# Patient Record
Sex: Female | Born: 1985 | Race: Black or African American | Hispanic: No | Marital: Single | Smoking: Current every day smoker
Health system: Southern US, Community
[De-identification: ages and names within clinical notes are randomized; demographics above are authoritative.]

## PROBLEM LIST (undated history)

## (undated) ENCOUNTER — Inpatient Hospital Stay (HOSPITAL_COMMUNITY): Payer: Self-pay

## (undated) DIAGNOSIS — F32A Depression, unspecified: Secondary | ICD-10-CM

## (undated) DIAGNOSIS — M549 Dorsalgia, unspecified: Secondary | ICD-10-CM

## (undated) DIAGNOSIS — Z6841 Body Mass Index (BMI) 40.0 and over, adult: Secondary | ICD-10-CM

## (undated) DIAGNOSIS — Z789 Other specified health status: Secondary | ICD-10-CM

## (undated) DIAGNOSIS — T7840XA Allergy, unspecified, initial encounter: Secondary | ICD-10-CM

## (undated) HISTORY — DX: Allergy, unspecified, initial encounter: T78.40XA

## (undated) HISTORY — DX: Body Mass Index (BMI) 40.0 and over, adult: Z684

## (undated) HISTORY — DX: Morbid (severe) obesity due to excess calories: E66.01

## (undated) HISTORY — DX: Dorsalgia, unspecified: M54.9

## (undated) HISTORY — PX: WISDOM TOOTH EXTRACTION: SHX21

---

## 2007-01-14 ENCOUNTER — Emergency Department (HOSPITAL_COMMUNITY): Admission: EM | Admit: 2007-01-14 | Discharge: 2007-01-15 | Payer: Self-pay | Admitting: Emergency Medicine

## 2007-09-28 ENCOUNTER — Emergency Department (HOSPITAL_COMMUNITY): Admission: EM | Admit: 2007-09-28 | Discharge: 2007-09-28 | Payer: Self-pay | Admitting: Emergency Medicine

## 2007-12-30 ENCOUNTER — Emergency Department (HOSPITAL_COMMUNITY): Admission: EM | Admit: 2007-12-30 | Discharge: 2007-12-31 | Payer: Self-pay | Admitting: Emergency Medicine

## 2009-01-04 ENCOUNTER — Emergency Department (HOSPITAL_COMMUNITY): Admission: EM | Admit: 2009-01-04 | Discharge: 2009-01-04 | Payer: Self-pay | Admitting: Emergency Medicine

## 2009-07-28 ENCOUNTER — Encounter (INDEPENDENT_AMBULATORY_CARE_PROVIDER_SITE_OTHER): Payer: Self-pay | Admitting: Obstetrics & Gynecology

## 2009-07-28 ENCOUNTER — Ambulatory Visit: Payer: Self-pay | Admitting: Obstetrics and Gynecology

## 2009-07-28 ENCOUNTER — Inpatient Hospital Stay (HOSPITAL_COMMUNITY): Admission: AD | Admit: 2009-07-28 | Discharge: 2009-07-31 | Payer: Self-pay | Admitting: Obstetrics & Gynecology

## 2010-03-15 ENCOUNTER — Emergency Department (HOSPITAL_COMMUNITY): Admission: EM | Admit: 2010-03-15 | Discharge: 2010-03-15 | Payer: Self-pay | Admitting: Emergency Medicine

## 2010-11-04 ENCOUNTER — Encounter: Payer: Self-pay | Admitting: Obstetrics & Gynecology

## 2011-01-16 LAB — DIFFERENTIAL
Basophils Absolute: 0.1 10*3/uL (ref 0.0–0.1)
Eosinophils Absolute: 0.1 10*3/uL (ref 0.0–0.7)
Eosinophils Relative: 1 % (ref 0–5)
Lymphocytes Relative: 23 % (ref 12–46)
Monocytes Absolute: 0.6 10*3/uL (ref 0.1–1.0)

## 2011-01-16 LAB — CBC
HCT: 28.4 % — ABNORMAL LOW (ref 36.0–46.0)
HCT: 30.4 % — ABNORMAL LOW (ref 36.0–46.0)
Hemoglobin: 10.4 g/dL — ABNORMAL LOW (ref 12.0–15.0)
Hemoglobin: 9.7 g/dL — ABNORMAL LOW (ref 12.0–15.0)
MCHC: 34.1 g/dL (ref 30.0–36.0)
MCV: 91.5 fL (ref 78.0–100.0)
Platelets: 159 10*3/uL (ref 150–400)
Platelets: 188 10*3/uL (ref 150–400)
RBC: 3.35 MIL/uL — ABNORMAL LOW (ref 3.87–5.11)
RBC: 3.79 MIL/uL — ABNORMAL LOW (ref 3.87–5.11)
RDW: 13.4 % (ref 11.5–15.5)
WBC: 12.6 10*3/uL — ABNORMAL HIGH (ref 4.0–10.5)
WBC: 9.7 10*3/uL (ref 4.0–10.5)

## 2011-01-29 ENCOUNTER — Emergency Department (HOSPITAL_COMMUNITY)
Admission: EM | Admit: 2011-01-29 | Discharge: 2011-01-29 | Disposition: A | Discharge status: Home / Self Care | Payer: No Typology Code available for payment source | Attending: Emergency Medicine | Admitting: Emergency Medicine

## 2011-01-29 DIAGNOSIS — M545 Low back pain, unspecified: Secondary | ICD-10-CM | POA: Insufficient documentation

## 2011-01-29 DIAGNOSIS — E669 Obesity, unspecified: Secondary | ICD-10-CM | POA: Insufficient documentation

## 2011-01-29 DIAGNOSIS — S335XXA Sprain of ligaments of lumbar spine, initial encounter: Secondary | ICD-10-CM | POA: Insufficient documentation

## 2012-05-13 ENCOUNTER — Encounter (HOSPITAL_COMMUNITY): Payer: Self-pay

## 2012-05-13 ENCOUNTER — Emergency Department (INDEPENDENT_AMBULATORY_CARE_PROVIDER_SITE_OTHER)
Admission: EM | Admit: 2012-05-13 | Discharge: 2012-05-13 | Disposition: A | Discharge status: Home / Self Care | Payer: Self-pay | Source: Home / Self Care | Attending: Emergency Medicine | Admitting: Emergency Medicine

## 2012-05-13 DIAGNOSIS — S39012A Strain of muscle, fascia and tendon of lower back, initial encounter: Secondary | ICD-10-CM

## 2012-05-13 DIAGNOSIS — S335XXA Sprain of ligaments of lumbar spine, initial encounter: Secondary | ICD-10-CM

## 2012-05-13 MED ORDER — TRAMADOL HCL 50 MG PO TABS: 50.0000 mg | ORAL_TABLET | Freq: Four times a day (QID) | ORAL | Status: AC | PRN

## 2012-05-13 MED ORDER — CYCLOBENZAPRINE HCL 10 MG PO TABS: 10.0000 mg | ORAL_TABLET | Freq: Three times a day (TID) | ORAL | Status: AC | PRN

## 2012-05-13 NOTE — ED Notes (Signed)
Pt states she bent over and felt tightness in her lower back around July 12th.  States in the last week the pain is much worse- states mostly rt lower back but can feel it to entire lower back with certain movements.  Pain is worse with sitting.

## 2012-05-13 NOTE — ED Provider Notes (Signed)
History     CSN: [id number]  Arrival date & time 05/13/12  1825   First MD Initiated Contact with Patient 05/13/12 1826      Chief Complaint  Patient presents with  . Back Pain    (Consider location/radiation/quality/duration/timing/severity/associated sxs/prior treatment) HPI Comments: Patient presents to urgent care approximately 2 weeks after she felt a sudden onset of tightness in her right lower back after she bent over to pick up something. Since last week she hasn't felt that the pain has worsened and has localized in the right side of her back. Patient describes it bending over and leaning forward and sitting makes the pain worse. Feels as sharp in character and denies any other associated symptoms such as numbness, tingling, weakness sensations on her right lower extremity. Patient also denies any constitutional symptoms such as fevers, malaise, arthralgias myalgias or unintentional weight loss.  Patient is a 26 y.o. female presenting with back pain. The history is provided by the patient.  Back Pain  This is a new problem. The current episode started more than 1 week ago. The problem occurs constantly. The problem has not changed since onset.Associated with: After bending over. The pain is present in the lumbar spine. The pain is at a severity of 7/10. The pain is moderate. The symptoms are aggravated by bending, twisting and certain positions. Stiffness is present all day. Pertinent negatives include no fever. She has tried nothing for the symptoms. The treatment provided no relief. Risk factors include obesity.    History reviewed. No pertinent past medical history.  Past Surgical History  Procedure Date  . Cesarean section     No family history on file.  History  Substance Use Topics  . Smoking status: Never Smoker   . Smokeless tobacco: Not on file  . Alcohol Use: Yes    OB History    Grav Para Term Preterm Abortions TAB SAB Ect Mult Living                   Review of Systems  Constitutional: Positive for activity change. Negative for fever and chills.  Musculoskeletal: Positive for back pain. Negative for myalgias, joint swelling and gait problem.  Skin: Negative for wound.    Allergies  Mucinex  Home Medications   Current Outpatient Rx  Name Route Sig Dispense Refill  . CYCLOBENZAPRINE HCL 10 MG PO TABS Oral Take 1 tablet (10 mg total) by mouth 3 (three) times daily as needed for muscle spasms. 20 tablet 0  . TRAMADOL HCL 50 MG PO TABS Oral Take 1 tablet (50 mg total) by mouth every 6 (six) hours as needed for pain. 15 tablet 0    BP 119/76  Pulse 97  Temp 98.7 F (37.1 C) (Oral)  Resp 18  SpO2 95%  Physical Exam  Vitals reviewed. Constitutional: Vital signs are normal. She appears well-developed.  Non-toxic appearance. She does not have a sickly appearance. She does not appear ill. No distress.  Abdominal: Soft.  Musculoskeletal: She exhibits tenderness. She exhibits no edema.       Lumbar back: She exhibits decreased range of motion, tenderness and pain. She exhibits no bony tenderness, no swelling, no edema, no deformity, no laceration, no spasm and normal pulse.       Back:  Neurological: She is alert.  Skin: Skin is warm. No rash noted. No erythema.    ED Course  Procedures (including critical care time)  Labs Reviewed - No data to display No  results found.   1. Strain of lumbar paraspinal muscle       MDM  Patient's exam is consistent with a right paravertebral or paraspinal muscular sprain or strain No Neuromuscular deficits on exam. Patient was prescribed tramadol and Flexeril and encouraged to perform back exercises. Was also advised to followup in orthopedic Dr. if pain persists beyond 4-6 weeks. We also discussed what symptoms will warrant further evaluation.        Jimmie Molly, MD 05/13/12 1902

## 2012-10-13 HISTORY — PX: TOOTH EXTRACTION: SUR596

## 2012-11-11 ENCOUNTER — Encounter (HOSPITAL_COMMUNITY): Payer: Self-pay | Admitting: Emergency Medicine

## 2012-11-11 ENCOUNTER — Emergency Department (HOSPITAL_COMMUNITY)
Admission: EM | Admit: 2012-11-11 | Discharge: 2012-11-11 | Disposition: A | Discharge status: Home / Self Care | Payer: Medicaid Other | Attending: Emergency Medicine | Admitting: Emergency Medicine

## 2012-11-11 DIAGNOSIS — F17200 Nicotine dependence, unspecified, uncomplicated: Secondary | ICD-10-CM | POA: Insufficient documentation

## 2012-11-11 DIAGNOSIS — L309 Dermatitis, unspecified: Secondary | ICD-10-CM

## 2012-11-11 DIAGNOSIS — L259 Unspecified contact dermatitis, unspecified cause: Secondary | ICD-10-CM | POA: Insufficient documentation

## 2012-11-11 NOTE — ED Provider Notes (Signed)
History    Scribed for the midlevel working with Gavin Pound. Ghim, MD, the patient was seen in room TR02C/TR02C . This chart was scribed by Lewanda Rife.  CSN: [id number]  Arrival date & time 11/11/12  4098   First MD Initiated Contact with Patient 11/11/12 2035      Chief Complaint  Patient presents with  . Rash    (Consider location/radiation/quality/duration/timing/severity/associated sxs/prior treatment) HPI Betty Shelton is a 27 y.o. female who presents to the Emergency Department complaining of an intermittently moderately pruritic rash on right forearm onset today. Pt reports uncle who was released from prison 10 days ago was recently diagnosed with scabies and pt is concerned she might have it. Pt states uncle is sleeping in the house and she helped put some of the scabies medication on his back with gloves on. Pt denies having rash anywhere else, new detergents, new products, and fever. Pt denies try any OTC medications to treat symptoms.    History reviewed. No pertinent past medical history.  Past Surgical History  Procedure Date  . Cesarean section     History reviewed. No pertinent family history.  History  Substance Use Topics  . Smoking status: Current Every Day Smoker  . Smokeless tobacco: Not on file  . Alcohol Use: Yes     Comment: socially    OB History    Grav Para Term Preterm Abortions TAB SAB Ect Mult Living                  Review of Systems  Constitutional: Negative.   HENT: Negative.   Respiratory: Negative.   Cardiovascular: Negative.   Gastrointestinal: Negative.   Musculoskeletal: Negative.   Skin: Positive for rash.  Neurological: Negative.   Hematological: Negative.   Psychiatric/Behavioral: Negative.   All other systems reviewed and are negative.   A complete 10 system review of systems was obtained and all systems are negative except as noted in the HPI and PMH.   Allergies  Mucinex  Home Medications  No current  outpatient prescriptions on file.  BP 139/53  Pulse 93  Temp 97.9 F (36.6 C) (Oral)  Resp 16  SpO2 96%  Physical Exam  Nursing note and vitals reviewed. Constitutional: She is oriented to person, place, and time. She appears well-developed and well-nourished. No distress.  HENT:  Head: Normocephalic and atraumatic.  Eyes: Conjunctivae normal are normal.  Neck: Neck supple. No tracheal deviation present.  Cardiovascular: Normal rate.   Pulmonary/Chest: Effort normal. No respiratory distress.  Musculoskeletal: Normal range of motion.  Neurological: She is alert and oriented to person, place, and time.  Skin: Skin is warm and dry. Rash (3 mildly raised papules noted on posterior right forearm) noted.  Psychiatric: She has a normal mood and affect. Her behavior is normal.    ED Course  Procedures (including critical care time)  Labs Reviewed - No data to display No results found.   No diagnosis found.  Dermatitis, limited to 3-4 papules on right forearm, no erythema.  Does not appear to be scabies.  MDM     I personally performed the services described in this documentation, which was scribed in my presence. The recorded information has been reviewed and is accurate.     Jimmye Norman, NP 11/12/12 [phone]

## 2012-11-11 NOTE — ED Notes (Signed)
Patient states family member has been diagnosed with scabies.  Patient has small area on right lateral forearm with bumps that she noticed today.  Patient states that the area does itch.

## 2012-11-11 NOTE — ED Notes (Signed)
Pt verbalized understanding of discharge; no additional questions by pt about discharge; e-signature obtained;

## 2012-11-14 NOTE — ED Provider Notes (Signed)
Medical screening examination/treatment/procedure(s) were performed by non-physician practitioner and as supervising physician I was immediately available for consultation/collaboration.   Tennyson Kallen Y. Cherise Fedder, MD 11/14/12 1630 

## 2013-08-30 ENCOUNTER — Encounter (HOSPITAL_COMMUNITY): Payer: Self-pay | Admitting: Emergency Medicine

## 2013-08-30 ENCOUNTER — Emergency Department (HOSPITAL_COMMUNITY)
Admission: EM | Admit: 2013-08-30 | Discharge: 2013-08-30 | Disposition: A | Discharge status: Home / Self Care | Payer: Medicaid Other | Attending: Emergency Medicine | Admitting: Emergency Medicine

## 2013-08-30 DIAGNOSIS — L02219 Cutaneous abscess of trunk, unspecified: Secondary | ICD-10-CM | POA: Insufficient documentation

## 2013-08-30 DIAGNOSIS — L02214 Cutaneous abscess of groin: Secondary | ICD-10-CM

## 2013-08-30 DIAGNOSIS — Z975 Presence of (intrauterine) contraceptive device: Secondary | ICD-10-CM | POA: Insufficient documentation

## 2013-08-30 DIAGNOSIS — F17200 Nicotine dependence, unspecified, uncomplicated: Secondary | ICD-10-CM | POA: Insufficient documentation

## 2013-08-30 DIAGNOSIS — Z792 Long term (current) use of antibiotics: Secondary | ICD-10-CM | POA: Insufficient documentation

## 2013-08-30 MED ORDER — DOXYCYCLINE HYCLATE 100 MG PO TABS
100.0000 mg | ORAL_TABLET | Freq: Once | ORAL | Status: AC
  Administered 2013-08-30: 100 mg via ORAL
  Filled 2013-08-30: qty 1

## 2013-08-30 MED ORDER — IBUPROFEN 200 MG PO TABS
600.0000 mg | ORAL_TABLET | Freq: Once | ORAL | Status: AC
  Administered 2013-08-30: 600 mg via ORAL
  Filled 2013-08-30: qty 3

## 2013-08-30 MED ORDER — IBUPROFEN 600 MG PO TABS: 600.0000 mg | ORAL_TABLET | Freq: Four times a day (QID) | ORAL | Status: DC | PRN

## 2013-08-30 MED ORDER — DOXYCYCLINE HYCLATE 100 MG PO CAPS: 100.0000 mg | ORAL_CAPSULE | Freq: Two times a day (BID) | ORAL | Status: DC

## 2013-08-30 NOTE — ED Notes (Signed)
Pt reports hx of ingrown hairs to her pubic area, now has a cyst to her R inner leg/hip joint.  Reports the cyst has doubled in size since yesterday.

## 2013-08-30 NOTE — ED Provider Notes (Signed)
CSN: [id number]     Arrival date & time 08/30/13  2237 History  This chart was scribed for non-physician practitioner working with Gwyneth Sprout, MD by Ashley Jacobs, ED scribe. This patient was seen in room WTR8/WTR8 and the patient's care was started at 11:04 PM.   First MD Initiated Contact with Patient 08/30/13 2250     Chief Complaint  Patient presents with  . Cyst   (Consider location/radiation/quality/duration/timing/severity/associated sxs/prior Treatment) The history is provided by the patient and medical records. No language interpreter was used.   HPI Comments: Betty Shelton is a 27 y.o. female who presents to the Emergency Department complaining of a cyst to her right inner leg and hip joint. Pt explains the cyst initially began as a ingrown hair and states the cyst has doubled in size since yesterday. Pt has allergies to Mucinex and does not have any prior medical complications. Pt currently smokes tobacco everyday and drinks socially.Has a hx of " ingrown hairs" in her pubic area     History reviewed. No pertinent past medical history. Past Surgical History  Procedure Laterality Date  . Cesarean section     History reviewed. No pertinent family history. History  Substance Use Topics  . Smoking status: Current Every Day Smoker  . Smokeless tobacco: Not on file  . Alcohol Use: Yes     Comment: socially   OB History   Grav Para Term Preterm Abortions TAB SAB Ect Mult Living                 Review of Systems  Constitutional: Negative for fever and chills.  Skin: Positive for wound.  All other systems reviewed and are negative.    Allergies  Mucinex  Home Medications   Current Outpatient Rx  Name  Route  Sig  Dispense  Refill  . levonorgestrel (MIRENA) 20 MCG/24HR IUD   Intrauterine   1 each by Intrauterine route once.         . doxycycline (VIBRAMYCIN) 100 MG capsule   Oral   Take 1 capsule (100 mg total) by mouth 2 (two) times daily.   20  capsule   0   . ibuprofen (ADVIL,MOTRIN) 600 MG tablet   Oral   Take 1 tablet (600 mg total) by mouth every 6 (six) hours as needed.   30 tablet   0    BP 105/89  Pulse 84  Temp(Src) 98.4 F (36.9 C) (Oral)  Resp 20  Ht 5\' 5"  (1.651 m)  Wt 304 lb (137.893 kg)  BMI 50.59 kg/m2  SpO2 99% Physical Exam  Nursing note and vitals reviewed. Constitutional: She is oriented to person, place, and time. She appears well-developed and well-nourished. No distress.  Morbidly obese  HENT:  Head: Normocephalic and atraumatic.  Eyes: EOM are normal. Pupils are equal, round, and reactive to light.  Neck: Neck supple.  Cardiovascular: Normal rate.   Pulmonary/Chest: Effort normal. No respiratory distress.  Abdominal: Soft. She exhibits no distension.  Musculoskeletal: Normal range of motion.  Neurological: She is alert and oriented to person, place, and time.  Skin:   2 cm by 1 cm area of fluctuance. In the skin fold of the R groin    Psychiatric: She has a normal mood and affect. Her behavior is normal.    ED Course  INCISION AND DRAINAGE Date/Time: 08/30/2013 11:22 PM Performed by: Arman Filter Authorized by: Arman Filter Consent: Verbal consent obtained. written consent not obtained. Risks and benefits:  risks, benefits and alternatives were discussed Consent given by: patient Patient understanding: patient states understanding of the procedure being performed Patient identity confirmed: verbally with patient Type: abscess Body area: lower extremity Location details: right hip Anesthesia: local infiltration Local anesthetic: lidocaine 1% with epinephrine Anesthetic total: 1 ml Patient sedated: no Scalpel size: 11 Needle gauge: 22 Incision type: single straight Complexity: simple Drainage: purulent Drainage amount: moderate Wound treatment: wound left open Packing material: 1/4 in iodoform gauze Patient tolerance: Patient tolerated the procedure well with no immediate  complications.   (including critical care time) DIAGNOSTIC STUDIES: Oxygen Saturation is 99% on room air, normal by my interpretation.    COORDINATION OF CARE: 11:08 PM Discussed course of care with pt . Pt understands and agrees.   Labs Review Labs Reviewed - No data to display Imaging Review No results found.  EKG Interpretation   None       MDM   1. Abscess of groin, right     I personally performed the services described in this documentation, which was scribed in my presence. The recorded information has been reviewed and is accurate.     Arman Filter, NP 08/30/13 2325

## 2013-08-30 NOTE — ED Notes (Signed)
Dry gauze applied to abscess.

## 2013-08-30 NOTE — ED Notes (Signed)
Pt presents with an abscess in her right groin.

## 2013-08-31 NOTE — ED Provider Notes (Signed)
Medical screening examination/treatment/procedure(s) were performed by non-physician practitioner and as supervising physician I was immediately available for consultation/collaboration.   Bettyann Birchler, MD 08/31/13 0355 

## 2013-10-27 ENCOUNTER — Encounter: Payer: Self-pay | Admitting: Obstetrics & Gynecology

## 2013-10-27 ENCOUNTER — Ambulatory Visit (INDEPENDENT_AMBULATORY_CARE_PROVIDER_SITE_OTHER): Payer: Medicaid Other | Admitting: Obstetrics & Gynecology

## 2013-10-27 VITALS — BP 138/94 | HR 70 | Ht 65.0 in | Wt 311.0 lb

## 2013-10-27 DIAGNOSIS — Z3009 Encounter for other general counseling and advice on contraception: Secondary | ICD-10-CM

## 2013-10-27 DIAGNOSIS — IMO0001 Reserved for inherently not codable concepts without codable children: Secondary | ICD-10-CM

## 2013-10-27 DIAGNOSIS — R6882 Decreased libido: Secondary | ICD-10-CM

## 2013-10-27 MED ORDER — NORETHINDRON-ETHINYL ESTRAD-FE 1-20/1-30/1-35 MG-MCG PO TABS: 1.0000 | ORAL_TABLET | Freq: Every day | ORAL | Status: DC

## 2013-10-27 NOTE — Progress Notes (Signed)
   Subjective:    Patient ID: Betty Shelton, female    DOB: 1985-10-31, 28 y.o.   MRN: [id number]  HPI  28 yo S AA [id number] who is here today because she would like to have her Mirena removed and switch to another form of contraceptive. She liked Estrostep which she used in the past. She thinks that the Mirena has led to emotional and sexual changes (decreased libido). She also liked the acne treating effect of OCPs.  Review of Systems Her pap was normal in 5/15 She has already had a flu vaccine. She works at [employer] ([employer])    Objective:   Physical Exam        Assessment & Plan:  Contraception- I will prescribe estrostep today and she will RTC in 3-4 weeks for IUD removal.

## 2013-10-28 LAB — COMPREHENSIVE METABOLIC PANEL
ALK PHOS: 51 U/L (ref 39–117)
ALT: 8 U/L (ref 0–35)
AST: 14 U/L (ref 0–37)
Albumin: 4.2 g/dL (ref 3.5–5.2)
BUN: 9 mg/dL (ref 6–23)
CALCIUM: 9 mg/dL (ref 8.4–10.5)
CHLORIDE: 104 meq/L (ref 96–112)
CO2: 25 mEq/L (ref 19–32)
CREATININE: 0.68 mg/dL (ref 0.50–1.10)
Glucose, Bld: 87 mg/dL (ref 70–99)
POTASSIUM: 4.4 meq/L (ref 3.5–5.3)
Sodium: 140 mEq/L (ref 135–145)
Total Bilirubin: 0.5 mg/dL (ref 0.3–1.2)
Total Protein: 7.1 g/dL (ref 6.0–8.3)

## 2013-10-28 LAB — CBC
HEMATOCRIT: 40.1 % (ref 36.0–46.0)
Hemoglobin: 13 g/dL (ref 12.0–15.0)
MCH: 29.1 pg (ref 26.0–34.0)
MCHC: 32.4 g/dL (ref 30.0–36.0)
MCV: 89.9 fL (ref 78.0–100.0)
PLATELETS: 342 10*3/uL (ref 150–400)
RBC: 4.46 MIL/uL (ref 3.87–5.11)
RDW: 13 % (ref 11.5–15.5)
WBC: 7.1 10*3/uL (ref 4.0–10.5)

## 2013-10-28 LAB — LIPID PANEL
CHOL/HDL RATIO: 5.3 ratio
Cholesterol: 202 mg/dL — ABNORMAL HIGH (ref 0–200)
HDL: 38 mg/dL — AB (ref >39)
LDL CALC: 147 mg/dL — AB (ref 0–99)
TRIGLYCERIDES: 83 mg/dL (ref <150)
VLDL: 17 mg/dL (ref 0–40)

## 2013-10-28 LAB — TSH: TSH: 1.421 u[IU]/mL (ref 0.350–4.500)

## 2013-11-15 ENCOUNTER — Encounter: Payer: Self-pay | Admitting: Obstetrics & Gynecology

## 2013-11-15 ENCOUNTER — Ambulatory Visit (INDEPENDENT_AMBULATORY_CARE_PROVIDER_SITE_OTHER): Payer: BC Managed Care – PPO | Admitting: Obstetrics & Gynecology

## 2013-11-15 VITALS — BP 123/86 | HR 75 | Ht 65.0 in | Wt 311.4 lb

## 2013-11-15 DIAGNOSIS — N898 Other specified noninflammatory disorders of vagina: Secondary | ICD-10-CM

## 2013-11-15 DIAGNOSIS — Z30432 Encounter for removal of intrauterine contraceptive device: Secondary | ICD-10-CM

## 2013-11-15 DIAGNOSIS — IMO0001 Reserved for inherently not codable concepts without codable children: Secondary | ICD-10-CM

## 2013-11-15 NOTE — Progress Notes (Signed)
   Subjective:    Patient ID: Betty Shelton, female    DOB: 28-Apr-1986, 28 y.o.   MRN: [id number]  HPI  She is here for Mirena removal. It had been placed by Dr. Aldona BarWein. She is happy taking Estrostep.  Review of Systems     Objective:   Physical Exam  There was a large amount of frothy white discharge, so I sent it for a wet prep. The strings were not visible. I cleaned her cervix with betadine and tried uterine dressing forceps, a cytobrush, and a endometrial hook, all failed attempts to find the IUD.  Bedside u/s shows that the Mirena is still in the uterus.      Assessment & Plan:  Retained IUD- I will schedule her for outpatient IUD removal in the OR.

## 2013-11-16 LAB — WET PREP, GENITAL
TRICH WET PREP: NONE SEEN
WBC, Wet Prep HPF POC: NONE SEEN
YEAST WET PREP: NONE SEEN

## 2013-11-17 ENCOUNTER — Encounter (HOSPITAL_COMMUNITY): Payer: Self-pay

## 2013-11-21 ENCOUNTER — Encounter (HOSPITAL_COMMUNITY): Payer: Self-pay | Admitting: *Deleted

## 2013-11-21 ENCOUNTER — Telehealth: Payer: Self-pay | Admitting: *Deleted

## 2013-11-21 DIAGNOSIS — N760 Acute vaginitis: Principal | ICD-10-CM

## 2013-11-21 DIAGNOSIS — B9689 Other specified bacterial agents as the cause of diseases classified elsewhere: Secondary | ICD-10-CM

## 2013-11-21 MED ORDER — METRONIDAZOLE 500 MG PO TABS: 500.0000 mg | ORAL_TABLET | Freq: Two times a day (BID) | ORAL | Status: AC

## 2013-11-21 NOTE — Telephone Encounter (Signed)
Patient given results and medication has been called into her pharmacy.

## 2013-11-21 NOTE — Telephone Encounter (Signed)
Message copied by Barbara CowerNOGUES, Saylah Ketner L on Mon Nov 21, 2013 10:44 AM ------      Message from: Nicholaus BloomVE, MYRA C      Created: Thu Nov 17, 2013  2:57 PM       She needs a prescription for Flagyl 500 mg po BID for 7 days.      Thanks ------

## 2013-11-28 ENCOUNTER — Ambulatory Visit (HOSPITAL_COMMUNITY)
Admission: RE | Admit: 2013-11-28 | Discharge: 2013-11-28 | Disposition: A | Discharge status: Home / Self Care | Payer: BC Managed Care – PPO | Source: Ambulatory Visit | Attending: Obstetrics & Gynecology | Admitting: Obstetrics & Gynecology

## 2013-11-28 ENCOUNTER — Encounter (HOSPITAL_COMMUNITY): Payer: BC Managed Care – PPO | Admitting: Certified Registered"

## 2013-11-28 ENCOUNTER — Ambulatory Visit (HOSPITAL_COMMUNITY): Payer: BC Managed Care – PPO | Admitting: Certified Registered"

## 2013-11-28 ENCOUNTER — Encounter (HOSPITAL_COMMUNITY)
Admission: RE | Disposition: A | Discharge status: Home / Self Care | Payer: Self-pay | Source: Ambulatory Visit | Attending: Obstetrics & Gynecology

## 2013-11-28 ENCOUNTER — Encounter (HOSPITAL_COMMUNITY): Payer: Self-pay | Admitting: Certified Registered"

## 2013-11-28 DIAGNOSIS — Z30432 Encounter for removal of intrauterine contraceptive device: Secondary | ICD-10-CM | POA: Insufficient documentation

## 2013-11-28 DIAGNOSIS — Z6841 Body Mass Index (BMI) 40.0 and over, adult: Secondary | ICD-10-CM | POA: Insufficient documentation

## 2013-11-28 DIAGNOSIS — F17200 Nicotine dependence, unspecified, uncomplicated: Secondary | ICD-10-CM | POA: Insufficient documentation

## 2013-11-28 HISTORY — PX: IUD REMOVAL: SHX5392

## 2013-11-28 LAB — CBC
HCT: 37.7 % (ref 36.0–46.0)
HEMOGLOBIN: 12.8 g/dL (ref 12.0–15.0)
MCH: 29.5 pg (ref 26.0–34.0)
MCHC: 34 g/dL (ref 30.0–36.0)
MCV: 86.9 fL (ref 78.0–100.0)
Platelets: 313 10*3/uL (ref 150–400)
RBC: 4.34 MIL/uL (ref 3.87–5.11)
RDW: 12.4 % (ref 11.5–15.5)
WBC: 6.5 10*3/uL (ref 4.0–10.5)

## 2013-11-28 LAB — PREGNANCY, URINE: PREG TEST UR: NEGATIVE

## 2013-11-28 SURGERY — REMOVAL, INTRAUTERINE DEVICE
Anesthesia: General | Site: Vagina

## 2013-11-28 MED ORDER — KETOROLAC TROMETHAMINE 30 MG/ML IJ SOLN
30.0000 mg | Freq: Once | INTRAMUSCULAR | Status: AC
  Administered 2013-11-28: 30 mg via INTRAVENOUS

## 2013-11-28 MED ORDER — DEXAMETHASONE SODIUM PHOSPHATE 10 MG/ML IJ SOLN
INTRAMUSCULAR | Status: DC | PRN
  Administered 2013-11-28: 10 mg via INTRAVENOUS

## 2013-11-28 MED ORDER — METOCLOPRAMIDE HCL 5 MG/ML IJ SOLN: 10.0000 mg | Freq: Once | INTRAMUSCULAR | Status: DC | PRN

## 2013-11-28 MED ORDER — PROPOFOL 10 MG/ML IV EMUL
INTRAVENOUS | Status: AC
  Filled 2013-11-28: qty 20

## 2013-11-28 MED ORDER — DEXAMETHASONE SODIUM PHOSPHATE 10 MG/ML IJ SOLN
INTRAMUSCULAR | Status: AC
  Filled 2013-11-28: qty 1

## 2013-11-28 MED ORDER — FENTANYL CITRATE 0.05 MG/ML IJ SOLN: 25.0000 ug | INTRAMUSCULAR | Status: DC | PRN

## 2013-11-28 MED ORDER — PROPOFOL 10 MG/ML IV EMUL
INTRAVENOUS | Status: DC | PRN
  Administered 2013-11-28 (×2): 30 mg via INTRAVENOUS

## 2013-11-28 MED ORDER — BUPIVACAINE HCL (PF) 0.5 % IJ SOLN
INTRAMUSCULAR | Status: AC
  Filled 2013-11-28: qty 30

## 2013-11-28 MED ORDER — KETOROLAC TROMETHAMINE 30 MG/ML IJ SOLN
INTRAMUSCULAR | Status: AC
  Filled 2013-11-28: qty 1

## 2013-11-28 MED ORDER — ONDANSETRON HCL 4 MG/2ML IJ SOLN
INTRAMUSCULAR | Status: AC
  Filled 2013-11-28: qty 2

## 2013-11-28 MED ORDER — FENTANYL CITRATE 0.05 MG/ML IJ SOLN
INTRAMUSCULAR | Status: DC | PRN
  Administered 2013-11-28 (×2): 50 ug via INTRAVENOUS

## 2013-11-28 MED ORDER — LIDOCAINE HCL (CARDIAC) 20 MG/ML IV SOLN
INTRAVENOUS | Status: DC | PRN
  Administered 2013-11-28: 80 mg via INTRAVENOUS

## 2013-11-28 MED ORDER — LIDOCAINE HCL (CARDIAC) 20 MG/ML IV SOLN
INTRAVENOUS | Status: AC
  Filled 2013-11-28: qty 5

## 2013-11-28 MED ORDER — MEPERIDINE HCL 25 MG/ML IJ SOLN: 6.2500 mg | INTRAMUSCULAR | Status: DC | PRN

## 2013-11-28 MED ORDER — LACTATED RINGERS IV SOLN
INTRAVENOUS | Status: DC
  Administered 2013-11-28: 10:00:00 via INTRAVENOUS

## 2013-11-28 MED ORDER — MIDAZOLAM HCL 2 MG/2ML IJ SOLN
INTRAMUSCULAR | Status: DC | PRN
  Administered 2013-11-28: 2 mg via INTRAVENOUS

## 2013-11-28 MED ORDER — FENTANYL CITRATE 0.05 MG/ML IJ SOLN
INTRAMUSCULAR | Status: AC
  Filled 2013-11-28: qty 2

## 2013-11-28 MED ORDER — BUPIVACAINE HCL (PF) 0.5 % IJ SOLN
INTRAMUSCULAR | Status: DC | PRN
  Administered 2013-11-28: 9 mL

## 2013-11-28 MED ORDER — MIDAZOLAM HCL 2 MG/2ML IJ SOLN
INTRAMUSCULAR | Status: AC
  Filled 2013-11-28: qty 2

## 2013-11-28 SURGICAL SUPPLY — 11 items
CATH ROBINSON RED A/P 16FR (CATHETERS) ×3 IMPLANT
CLOTH BEACON ORANGE TIMEOUT ST (SAFETY) ×3 IMPLANT
GLOVE BIO SURGEON STRL SZ 6.5 (GLOVE) ×2 IMPLANT
GLOVE BIO SURGEONS STRL SZ 6.5 (GLOVE) ×1
GOWN STRL REUS W/TWL LRG LVL3 (GOWN DISPOSABLE) ×6 IMPLANT
NEEDLE SPNL 22GX3.5 QUINCKE BK (NEEDLE) ×3 IMPLANT
PACK VAGINAL MINOR WOMEN LF (CUSTOM PROCEDURE TRAY) ×3 IMPLANT
PAD PREP 24X48 CUFFED NSTRL (MISCELLANEOUS) ×3 IMPLANT
SYR CONTROL 10ML LL (SYRINGE) ×3 IMPLANT
TOWEL OR 17X24 6PK STRL BLUE (TOWEL DISPOSABLE) ×6 IMPLANT
WATER STERILE IRR 1000ML POUR (IV SOLUTION) ×3 IMPLANT

## 2013-11-28 NOTE — H&P (Signed)
Betty Shelton is an 28 y.o. female. She has a retained Mirena and would like it removed. I attempted this in the office but was unable to even find the strings.  Pertinent Gynecological History: Past Medical History  Diagnosis Date  . Morbid obesity with BMI of 50.0-59.9, adult     Past Surgical History  Procedure Laterality Date  . Cesarean section      Family History  Problem Relation Age of Onset  . Diabetes Mother   . Cancer Maternal Aunt     breast  . Diabetes Maternal Grandmother   . Hypertension Maternal Grandmother     Social History:  reports that she has been smoking.  She has never used smokeless tobacco. She reports that she drinks alcohol. She reports that she does not use illicit drugs.  Allergies:  Allergies  Allergen Reactions  . Mucinex [Guaifenesin Er] Hives and Swelling    Prescriptions prior to admission  Medication Sig Dispense Refill  . levonorgestrel (MIRENA) 20 MCG/24HR IUD 1 each by Intrauterine route once.      . metroNIDAZOLE (FLAGYL) 500 MG tablet Take 1 tablet (500 mg total) by mouth 2 (two) times daily.  14 tablet  0  . norethindrone-ethinyl estradiol-iron (ESTROSTEP FE,TILIA FE,TRI-LEGEST FE) 1-20/1-30/1-35 MG-MCG tablet Take 1 tablet by mouth daily.  1 Package  11    ROS  There were no vitals taken for this visit. Physical Exam Heart- rrr Lungs- CTAB Abd- obese, benign  No results found for this or any previous visit (from the past 24 hour(s)).  No results found.  Assessment/Plan: Retained IUD. Plan for removal under anesthesia  Nellie Chevalier C. 11/28/2013, 9:52 AM

## 2013-11-28 NOTE — Anesthesia Postprocedure Evaluation (Signed)
  Anesthesia Post-op Note  Patient: [name]  Procedure(s) Performed: Procedure(s): INTRAUTERINE DEVICE (IUD) REMOVAL (N/A)  Patient Location: PACU  Anesthesia Type:MAC  Level of Consciousness: awake, alert  and oriented  Airway and Oxygen Therapy: Patient Spontanous Breathing  Post-op Pain: none  Post-op Assessment: Post-op Vital signs reviewed, Patient's Cardiovascular Status Stable, Respiratory Function Stable, Patent Airway, No signs of Nausea or vomiting and Pain level controlled  Post-op Vital Signs: Reviewed and stable  Complications: No apparent anesthesia complications

## 2013-11-28 NOTE — Anesthesia Preprocedure Evaluation (Signed)
Anesthesia Evaluation  Patient identified by MRN, date of birth, ID band Patient awake    Reviewed: Allergy & Precautions, H&P , NPO status , Patient's Chart, lab work & pertinent test results  Airway Mallampati: III TM Distance: >3 FB Neck ROM: Full    Dental no notable dental hx. (+) Teeth Intact   Pulmonary Current Smoker,  breath sounds clear to auscultation  Pulmonary exam normal       Cardiovascular negative cardio ROS  Rhythm:Regular Rate:Normal     Neuro/Psych negative neurological ROS  negative psych ROS   GI/Hepatic negative GI ROS, Neg liver ROS,   Endo/Other  Morbid obesity  Renal/GU negative Renal ROS  negative genitourinary   Musculoskeletal negative musculoskeletal ROS (+)   Abdominal (+) + obese,   Peds  Hematology negative hematology ROS (+)   Anesthesia Other Findings   Reproductive/Obstetrics Retained/trapped IUD                           Anesthesia Physical Anesthesia Plan  ASA: III  Anesthesia Plan: General   Post-op Pain Management:    Induction: Intravenous  Airway Management Planned: Mask and LMA  Additional Equipment:   Intra-op Plan:   Post-operative Plan: Extubation in OR  Informed Consent: I have reviewed the patients History and Physical, chart, labs and discussed the procedure including the risks, benefits and alternatives for the proposed anesthesia with the patient or authorized representative who has indicated his/her understanding and acceptance.   Dental advisory given  Plan Discussed with: Anesthesiologist, CRNA and Surgeon  Anesthesia Plan Comments:         Anesthesia Quick Evaluation

## 2013-11-28 NOTE — Op Note (Signed)
11/28/2013  10:56 AM  PATIENT:  Betty DrownErica G Shelton  28 y.o. female  PRE-OPERATIVE DIAGNOSIS:   Retained IUD  POST-OPERATIVE DIAGNOSIS: same  PROCEDURE:  Procedure(s): INTRAUTERINE DEVICE (IUD) REMOVAL (N/A)  SURGEON:  Surgeon(s) and Role:    * Allie BossierMyra C Kyndal Gloster, MD - Primary  PHYSICIAN ASSISTANT:   ASSISTANTS: local   ANESTHESIA:   local and IV sedation  EBL:     BLOOD ADMINISTERED:none  DRAINS: none   LOCAL MEDICATIONS USED:  MARCAINE     SPECIMEN:  No Specimen  DISPOSITION OF SPECIMEN:  PATHOLOGY  COUNTS:  YES  TOURNIQUET:  * No tourniquets in log *  DICTATION: .Dragon Dictation  PLAN OF CARE: Discharge to home after PACU  PATIENT DISPOSITION:  PACU - hemodynamically stable.   Delay start of Pharmacological VTE agent (>24hrs) due to surgical blood loss or risk of bleeding: not applicable  In the OR, she was placed in the dorsal lithotomy position. IV anesthesia was given. Time out was done. Her vagina was prepped with betadine. A speculum was used to visualize the cervix. It was grasped with a tenaculum and 10 mL of 0.5% marcaine was used to place a paracervical block. I used uterine dressing forceps to easily remove the intact Mirena IUD. She was taken to the recovery room in stable condition.

## 2013-11-28 NOTE — Transfer of Care (Signed)
Immediate Anesthesia Transfer of Care Note  Patient: Betty Shelton  Procedure(s) Performed: Procedure(s): INTRAUTERINE DEVICE (IUD) REMOVAL (N/A)  Patient Location: PACU  Anesthesia Type:MAC  Level of Consciousness: awake, alert  and oriented  Airway & Oxygen Therapy: Patient Spontanous Breathing and Patient connected to nasal cannula oxygen  Post-op Assessment: Report given to PACU RN, Post -op Vital signs reviewed and stable and Patient moving all extremities  Post vital signs: Reviewed and stable  Complications: No apparent anesthesia complications

## 2013-11-28 NOTE — Preoperative (Signed)
Beta Blockers   Reason not to administer Beta Blockers:Not Applicable 

## 2013-11-29 ENCOUNTER — Encounter (HOSPITAL_COMMUNITY): Payer: Self-pay | Admitting: Obstetrics & Gynecology

## 2014-01-02 ENCOUNTER — Ambulatory Visit (INDEPENDENT_AMBULATORY_CARE_PROVIDER_SITE_OTHER): Payer: BC Managed Care – PPO | Admitting: Obstetrics & Gynecology

## 2014-01-02 ENCOUNTER — Encounter: Payer: Self-pay | Admitting: Obstetrics & Gynecology

## 2014-01-02 VITALS — BP 124/82 | HR 70 | Ht 65.0 in | Wt 317.0 lb

## 2014-01-02 DIAGNOSIS — Z09 Encounter for follow-up examination after completed treatment for conditions other than malignant neoplasm: Secondary | ICD-10-CM

## 2014-01-02 DIAGNOSIS — F3289 Other specified depressive episodes: Secondary | ICD-10-CM

## 2014-01-02 DIAGNOSIS — F329 Major depressive disorder, single episode, unspecified: Secondary | ICD-10-CM

## 2014-01-02 DIAGNOSIS — F32A Depression, unspecified: Secondary | ICD-10-CM

## 2014-01-02 NOTE — Progress Notes (Signed)
   Subjective:    Patient ID: Betty Shelton, female    DOB: 1985/11/17, 28 y.o.   MRN: [id number]  HPI  She is here for a post op check after having her retained IUD removed. She is on the OCPs. She complains of weight gain, feeling depressed since high school, not worse since starting the pill. She just feels stressed and spends much of her free time in bed. She denies HI or SI.   Review of Systems     Objective:   Physical Exam        Assessment & Plan:   Post op -doing well Contraception- continue OCP, however, I have explained that OCPs can worsen depression. She does not think that this is the case for her. Depression- recent [facility]SH normal. [facility]

## 2014-01-12 ENCOUNTER — Encounter (HOSPITAL_COMMUNITY): Payer: Self-pay | Admitting: Emergency Medicine

## 2014-01-12 ENCOUNTER — Emergency Department (HOSPITAL_COMMUNITY)
Admission: EM | Admit: 2014-01-12 | Discharge: 2014-01-12 | Disposition: A | Discharge status: Home / Self Care | Payer: BC Managed Care – PPO | Attending: Emergency Medicine | Admitting: Emergency Medicine

## 2014-01-12 DIAGNOSIS — Z79899 Other long term (current) drug therapy: Secondary | ICD-10-CM | POA: Insufficient documentation

## 2014-01-12 DIAGNOSIS — F17200 Nicotine dependence, unspecified, uncomplicated: Secondary | ICD-10-CM | POA: Insufficient documentation

## 2014-01-12 DIAGNOSIS — J069 Acute upper respiratory infection, unspecified: Secondary | ICD-10-CM | POA: Insufficient documentation

## 2014-01-12 DIAGNOSIS — H9209 Otalgia, unspecified ear: Secondary | ICD-10-CM | POA: Insufficient documentation

## 2014-01-12 DIAGNOSIS — H938X9 Other specified disorders of ear, unspecified ear: Secondary | ICD-10-CM | POA: Insufficient documentation

## 2014-01-12 DIAGNOSIS — Z6841 Body Mass Index (BMI) 40.0 and over, adult: Secondary | ICD-10-CM | POA: Insufficient documentation

## 2014-01-12 DIAGNOSIS — Z3202 Encounter for pregnancy test, result negative: Secondary | ICD-10-CM | POA: Insufficient documentation

## 2014-01-12 LAB — POC URINE PREG, ED: Preg Test, Ur: NEGATIVE

## 2014-01-12 MED ORDER — DOXYCYCLINE HYCLATE 100 MG PO CAPS: 100.0000 mg | ORAL_CAPSULE | Freq: Two times a day (BID) | ORAL | Status: DC

## 2014-01-12 MED ORDER — HYDROCODONE-ACETAMINOPHEN 5-325 MG PO TABS: 1.0000 | ORAL_TABLET | Freq: Four times a day (QID) | ORAL | Status: DC | PRN

## 2014-01-12 NOTE — Discharge Instructions (Signed)

## 2014-01-12 NOTE — ED Notes (Signed)
Pt c/o cough x 2 day, coughing up yellow sputum and c/o left ear pain x years. States ear is always itching and hurts to put head phones in ear.

## 2014-01-12 NOTE — ED Provider Notes (Signed)
CSN: [id number]     Arrival date & time 01/12/14  0908 History   First MD Initiated Contact with Patient 01/12/14 [phone]     Chief Complaint  Patient presents with  . Cough  . Otalgia     (Consider location/radiation/quality/duration/timing/severity/associated sxs/prior Treatment) Patient is a 28 y.o. female presenting with cough and ear pain. The history is provided by the patient.  Cough Associated symptoms: chills, ear pain and sore throat   Associated symptoms: no fever   Otalgia Associated symptoms: cough and sore throat   Associated symptoms: no abdominal pain and no fever    patient's had a productive cough for the last 2 weeks. She's had a cough productive of yellow sputum. She states she's had some chills without fevers. She's had some mild nausea, but states it comes with the coughing. She also has ear pain, but states her left ear has been hurting for the last 3 years since she had an otitis externa. No abdominal pain. No diarrhea or constipation.  Past Medical History  Diagnosis Date  . Morbid obesity with BMI of 50.0-59.9, adult    Past Surgical History  Procedure Laterality Date  . Cesarean section    . Tooth extraction  2014  . Wisdom tooth extraction    . Iud removal N/A 11/28/2013    Procedure: INTRAUTERINE DEVICE (IUD) REMOVAL;  Surgeon: Allie Bossier, MD;  Location: [facility];  Service: Gynecology;  Laterality: N/A;   Family History  Problem Relation Age of Onset  . Diabetes Mother   . Cancer Maternal Aunt     breast  . Diabetes Maternal Grandmother   . Hypertension Maternal Grandmother    History  Substance Use Topics  . Smoking status: Current Every Day Smoker -- 0.25 packs/day for 8 years    Types: Cigarettes  . Smokeless tobacco: Never Used  . Alcohol Use: Yes     Comment: socially   OB History   Grav Para Term Preterm Abortions TAB SAB Ect Mult Living                 Review of Systems  Constitutional: Positive for chills and fatigue. Negative for  fever.  HENT: Positive for ear pain and sore throat. Negative for postnasal drip and trouble swallowing.   Respiratory: Positive for cough.   Gastrointestinal: Negative for abdominal pain.  Genitourinary: Negative for dysuria.  Musculoskeletal: Negative for back pain.      Allergies  Mucinex  Home Medications   Current Outpatient Rx  Name  Route  Sig  Dispense  Refill  . norethindrone-ethinyl estradiol-iron (ESTROSTEP FE,TILIA FE,TRI-LEGEST FE) 1-20/1-30/1-35 MG-MCG tablet   Oral   Take 1 tablet by mouth daily.   1 Package   11   . doxycycline (VIBRAMYCIN) 100 MG capsule   Oral   Take 1 capsule (100 mg total) by mouth 2 (two) times daily.   14 capsule   0   . HYDROcodone-acetaminophen (NORCO/VICODIN) 5-325 MG per tablet   Oral   Take 1 tablet by mouth every 6 (six) hours as needed (cough).   10 tablet   0    BP 117/56  Pulse 65  Temp(Src) 98.5 F (36.9 C) (Oral)  Resp 20  SpO2 100%  LMP 12/19/2013 Physical Exam  Constitutional: She appears well-developed and well-nourished.  Patient is obese  HENT:  Head: Normocephalic.  Left external ear canal mildly tender. No erythema. Small white raised bumps. No fluctuance. No mastoid tenderness. TM appears normal. Right TM  and external canal normal. Minimal posterior pharyngeal erythema.  Eyes: Pupils are equal, round, and reactive to light.  Neck: Normal range of motion. Neck supple.  Cardiovascular: Normal rate and regular rhythm.   Pulmonary/Chest: Effort normal and breath sounds normal.  Abdominal: Soft.  Musculoskeletal: Normal range of motion.  Neurological: She is alert.  Skin: Skin is warm.    ED Course  Procedures (including critical care time) Labs Review Labs Reviewed  POC URINE PREG, ED   Imaging Review No results found.   EKG Interpretation None      MDM   Final diagnoses:  URI (upper respiratory infection)    Patient with URI symptoms. Also has some small vesicles in the left ear. The  ear pain has been there for years. We'll treat with antibiotics    Juliet RudeNathan R. Rubin PayorPickering, MD 01/14/14 1517

## 2014-04-06 ENCOUNTER — Other Ambulatory Visit (INDEPENDENT_AMBULATORY_CARE_PROVIDER_SITE_OTHER): Payer: BC Managed Care – PPO | Admitting: *Deleted

## 2014-04-06 DIAGNOSIS — N390 Urinary tract infection, site not specified: Secondary | ICD-10-CM

## 2014-04-06 LAB — POCT URINALYSIS DIPSTICK
Bilirubin, UA: NEGATIVE
Glucose, UA: NEGATIVE
KETONES UA: NEGATIVE
Nitrite, UA: NEGATIVE
Protein, UA: NEGATIVE
Spec Grav, UA: >=1.03
Urobilinogen, UA: NEGATIVE
pH, UA: 6

## 2014-04-06 MED ORDER — CIPROFLOXACIN HCL 500 MG PO TABS: 500.0000 mg | ORAL_TABLET | Freq: Two times a day (BID) | ORAL | Status: DC

## 2014-04-06 NOTE — Progress Notes (Signed)
Patient is having burning and urgency with urination.  She was recently treated with Macrobid through work for a uti.  Today her urine dip is positive for 3+ blood and moderate leukocytes.  I will send off for culture and  Call in cipro for patient in the meantime.

## 2014-04-08 LAB — CULTURE, URINE COMPREHENSIVE

## 2014-05-16 ENCOUNTER — Other Ambulatory Visit (INDEPENDENT_AMBULATORY_CARE_PROVIDER_SITE_OTHER): Payer: BC Managed Care – PPO | Admitting: *Deleted

## 2014-05-16 DIAGNOSIS — N912 Amenorrhea, unspecified: Secondary | ICD-10-CM

## 2014-05-16 DIAGNOSIS — Z793 Long term (current) use of hormonal contraceptives: Principal | ICD-10-CM

## 2014-05-16 LAB — POCT URINE PREGNANCY: PREG TEST UR: NEGATIVE

## 2014-05-16 NOTE — Progress Notes (Signed)
Patient is requesting a pregnancy test.  She is currently taking ocp's but has been on two rounds of antibiotics and her period did not come this months when she got to the end of her pill pack.

## 2014-05-28 ENCOUNTER — Emergency Department (HOSPITAL_COMMUNITY)
Admission: EM | Admit: 2014-05-28 | Discharge: 2014-05-28 | Disposition: A | Discharge status: Home / Self Care | Payer: BC Managed Care – PPO | Attending: Emergency Medicine | Admitting: Emergency Medicine

## 2014-05-28 ENCOUNTER — Encounter (HOSPITAL_COMMUNITY): Payer: Self-pay | Admitting: Emergency Medicine

## 2014-05-28 DIAGNOSIS — F17200 Nicotine dependence, unspecified, uncomplicated: Secondary | ICD-10-CM | POA: Insufficient documentation

## 2014-05-28 DIAGNOSIS — N898 Other specified noninflammatory disorders of vagina: Secondary | ICD-10-CM | POA: Diagnosis present

## 2014-05-28 DIAGNOSIS — Z79899 Other long term (current) drug therapy: Secondary | ICD-10-CM | POA: Insufficient documentation

## 2014-05-28 DIAGNOSIS — A5901 Trichomonal vulvovaginitis: Secondary | ICD-10-CM | POA: Diagnosis not present

## 2014-05-28 DIAGNOSIS — A599 Trichomoniasis, unspecified: Secondary | ICD-10-CM

## 2014-05-28 LAB — URINALYSIS, ROUTINE W REFLEX MICROSCOPIC
Glucose, UA: NEGATIVE mg/dL
Hgb urine dipstick: NEGATIVE
KETONES UR: 40 mg/dL — AB
LEUKOCYTES UA: NEGATIVE
NITRITE: NEGATIVE
PROTEIN: 30 mg/dL — AB
Specific Gravity, Urine: 1.025 (ref 1.005–1.030)
Urobilinogen, UA: 1 mg/dL (ref 0.0–1.0)
pH: 6 (ref 5.0–8.0)

## 2014-05-28 LAB — URINE MICROSCOPIC-ADD ON

## 2014-05-28 LAB — WET PREP, GENITAL
Clue Cells Wet Prep HPF POC: NONE SEEN
YEAST WET PREP: NONE SEEN

## 2014-05-28 LAB — RPR

## 2014-05-28 LAB — HIV ANTIBODY (ROUTINE TESTING W REFLEX): HIV: NONREACTIVE

## 2014-05-28 MED ORDER — AZITHROMYCIN 250 MG PO TABS
1000.0000 mg | ORAL_TABLET | Freq: Once | ORAL | Status: AC
  Administered 2014-05-28: 1000 mg via ORAL
  Filled 2014-05-28: qty 4

## 2014-05-28 MED ORDER — METRONIDAZOLE 500 MG PO TABS
2000.0000 mg | ORAL_TABLET | Freq: Once | ORAL | Status: AC
  Administered 2014-05-28: 2000 mg via ORAL
  Filled 2014-05-28: qty 4

## 2014-05-28 MED ORDER — METRONIDAZOLE 500 MG PO TABS
1500.0000 mg | ORAL_TABLET | Freq: Once | ORAL | Status: AC
  Administered 2014-05-28: 1500 mg via ORAL
  Filled 2014-05-28: qty 3

## 2014-05-28 MED ORDER — CEFTRIAXONE SODIUM 250 MG IJ SOLR
250.0000 mg | Freq: Once | INTRAMUSCULAR | Status: AC
  Administered 2014-05-28: 250 mg via INTRAMUSCULAR
  Filled 2014-05-28: qty 250

## 2014-05-28 MED ORDER — IBUPROFEN 800 MG PO TABS
800.0000 mg | ORAL_TABLET | Freq: Once | ORAL | Status: AC
  Administered 2014-05-28: 800 mg via ORAL
  Filled 2014-05-28: qty 1

## 2014-05-28 MED ORDER — LIDOCAINE HCL 1 % IJ SOLN
INTRAMUSCULAR | Status: AC
  Administered 2014-05-28: 2.1 mL
  Filled 2014-05-28: qty 20

## 2014-05-28 NOTE — Discharge Instructions (Signed)

## 2014-05-28 NOTE — ED Notes (Signed)
Pt couldn't get down crushed flagyl, can swallow tablets.

## 2014-05-28 NOTE — ED Notes (Addendum)
Pt reports that on Thursday evening she had chills and difficulty sleeping. Pt reports generalized aches on Friday. Pt attempted to call her PCP, who was unable to schedule an appointment. Pt reports having a "bright-orange" urine. Pt also states that her eyes have been aching and also that she is having a yellow-red vaginal discharge. When asked, what specifically brought you to the ED today, pt states "everything." Pt is A/O x4 and in NAD.

## 2014-05-28 NOTE — ED Provider Notes (Signed)
CSN: [id number]     Arrival date & time 05/28/14  1517 History   First MD Initiated Contact with Patient 05/28/14 1559     Chief Complaint  Patient presents with  . Vaginal Discharge  . Multiple Complaints     HPI Patient presents to the emergency room with complaints of urinary discomfort as well as vaginal discharge. Patient states around Thursday evening he started having some trouble with generalized chills. He also felt like he was having some difficulty with sleeping. The patient states she's also had some generalized body aches. Eyes also been itching and burning but she hasn't noticed any discharge. She denies any sore throat. She's not had any coughing. She's not had any vomiting or diarrhea. She has noticed that her urine looks bright orange. She's had a yellowish vaginal discharge as well. Patient turned in the point with her doctor but was not able to be seen last week. Past Medical History  Diagnosis Date  . Morbid obesity with BMI of 50.0-59.9, adult    Past Surgical History  Procedure Laterality Date  . Cesarean section    . Tooth extraction  2014  . Wisdom tooth extraction    . Iud removal N/A 11/28/2013    Procedure: INTRAUTERINE DEVICE (IUD) REMOVAL;  Surgeon: Allie BossierMyra C Dove, MD;  Location: [facility];  Service: Gynecology;  Laterality: N/A;   Family History  Problem Relation Age of Onset  . Diabetes Mother   . Cancer Maternal Aunt     breast  . Diabetes Maternal Grandmother   . Hypertension Maternal Grandmother    History  Substance Use Topics  . Smoking status: Current Every Day Smoker -- 0.25 packs/day for 8 years    Types: Cigarettes  . Smokeless tobacco: Never Used  . Alcohol Use: Yes     Comment: socially   OB History   Grav Para Term Preterm Abortions TAB SAB Ect Mult Living                 Review of Systems  All other systems reviewed and are negative.     Allergies  Mucinex  Home Medications   Prior to Admission medications   Medication Sig  Start Date End Date Taking? Authorizing Provider  norethindrone-ethinyl estradiol-iron (ESTROSTEP FE,TILIA FE,TRI-LEGEST FE) 1-20/1-30/1-35 MG-MCG tablet Take 1 tablet by mouth daily. 10/27/13  Yes Myra Inda Coke Dove, MD   BP 119/66  Pulse 108  Temp(Src) 99.3 F (37.4 C) (Oral)  Resp 20  SpO2 98%  LMP 04/05/2014 Physical Exam  Nursing note and vitals reviewed. Constitutional: No distress.  Morbidly obese  HENT:  Head: Normocephalic and atraumatic.  Right Ear: External ear normal.  Left Ear: External ear normal.  Eyes: Conjunctivae are normal. Right eye exhibits no discharge. Left eye exhibits no discharge. No scleral icterus.  Neck: Neck supple. No tracheal deviation present.  Cardiovascular: Normal rate, regular rhythm and intact distal pulses.   Pulmonary/Chest: Effort normal and breath sounds normal. No stridor. No respiratory distress. She has no wheezes. She has no rales.  Abdominal: Soft. Bowel sounds are normal. She exhibits no distension. There is no tenderness. There is no rebound and no guarding.  Musculoskeletal: She exhibits no edema and no tenderness.  Neurological: She is alert. She has normal strength. No cranial nerve deficit (no facial droop, extraocular movements intact, no slurred speech) or sensory deficit. She exhibits normal muscle tone. She displays no seizure activity. Coordination normal.  Skin: Skin is warm and dry. No rash noted.  Psychiatric: She has a normal mood and affect.    ED Course  Procedures (including critical care time) Labs Review Labs Reviewed  WET PREP, GENITAL - Abnormal; Notable for the following:    Trich, Wet Prep MODERATE (*)    WBC, Wet Prep HPF POC FEW (*)    All other components within normal limits  URINALYSIS, ROUTINE W REFLEX MICROSCOPIC - Abnormal; Notable for the following:    Color, Urine AMBER (*)    Bilirubin Urine SMALL (*)    Ketones, ur 40 (*)    Protein, ur 30 (*)    All other components within normal limits  URINE  CULTURE  GC/CHLAMYDIA PROBE AMP  URINE MICROSCOPIC-ADD ON  RPR  HIV ANTIBODY (ROUTINE TESTING)     MDM   Final diagnoses:  Trichomonas vaginalis infection    Symptoms are consistent with vaginitis.  Trichomonas noted on her wet prep.  Treat with flagyl.  Cover for STD.  Follow up with a PCP    Linwood Dibbles, MD 05/28/14 [phone]

## 2014-05-30 LAB — GC/CHLAMYDIA PROBE AMP
CT PROBE, AMP APTIMA: NEGATIVE
GC Probe RNA: NEGATIVE

## 2014-05-30 LAB — URINE CULTURE
Colony Count: NO GROWTH
Culture: NO GROWTH
Special Requests: NORMAL

## 2014-10-02 ENCOUNTER — Other Ambulatory Visit: Payer: Self-pay | Admitting: Obstetrics & Gynecology

## 2014-11-06 ENCOUNTER — Encounter: Payer: Self-pay | Admitting: Obstetrics & Gynecology

## 2014-11-06 ENCOUNTER — Ambulatory Visit (INDEPENDENT_AMBULATORY_CARE_PROVIDER_SITE_OTHER): Payer: BLUE CROSS/BLUE SHIELD | Admitting: Obstetrics & Gynecology

## 2014-11-06 VITALS — BP 129/88 | HR 77 | Ht 65.0 in | Wt 322.0 lb

## 2014-11-06 DIAGNOSIS — N760 Acute vaginitis: Secondary | ICD-10-CM | POA: Diagnosis not present

## 2014-11-06 DIAGNOSIS — A599 Trichomoniasis, unspecified: Secondary | ICD-10-CM

## 2014-11-06 DIAGNOSIS — A499 Bacterial infection, unspecified: Secondary | ICD-10-CM

## 2014-11-06 DIAGNOSIS — Z124 Encounter for screening for malignant neoplasm of cervix: Secondary | ICD-10-CM | POA: Diagnosis not present

## 2014-11-06 DIAGNOSIS — Z01419 Encounter for gynecological examination (general) (routine) without abnormal findings: Secondary | ICD-10-CM | POA: Diagnosis not present

## 2014-11-06 DIAGNOSIS — Z3041 Encounter for surveillance of contraceptive pills: Secondary | ICD-10-CM | POA: Diagnosis not present

## 2014-11-06 DIAGNOSIS — Z113 Encounter for screening for infections with a predominantly sexual mode of transmission: Secondary | ICD-10-CM

## 2014-11-06 DIAGNOSIS — B9689 Other specified bacterial agents as the cause of diseases classified elsewhere: Secondary | ICD-10-CM

## 2014-11-06 MED ORDER — NORETHINDRON-ETHINYL ESTRAD-FE 1-20/1-30/1-35 MG-MCG PO TABS: 1.0000 | ORAL_TABLET | Freq: Every day | ORAL | Status: DC

## 2014-11-06 MED ORDER — BORIC ACID CRYS: 600.0000 mg | CRYSTALS | Status: DC

## 2014-11-06 NOTE — Patient Instructions (Signed)
Preventive Care for Adults A healthy lifestyle and preventive care can promote health and wellness. Preventive health guidelines for women include the following key practices.  A routine yearly physical is a good way to check with your health care provider about your health and preventive screening. It is a chance to share any concerns and updates on your health and to receive a thorough exam.  Visit your dentist for a routine exam and preventive care every 6 months. Brush your teeth twice a day and floss once a day. Good oral hygiene prevents tooth decay and gum disease.  The frequency of eye exams is based on your age, health, family medical history, use of contact lenses, and other factors. Follow your health care provider's recommendations for frequency of eye exams.  Eat a healthy diet. Foods like vegetables, fruits, whole grains, low-fat dairy products, and lean protein foods contain the nutrients you need without too many calories. Decrease your intake of foods high in solid fats, added sugars, and salt. Eat the right amount of calories for you.Get information about a proper diet from your health care provider, if necessary.  Regular physical exercise is one of the most important things you can do for your health. Most adults should get at least 150 minutes of moderate-intensity exercise (any activity that increases your heart rate and causes you to sweat) each week. In addition, most adults need muscle-strengthening exercises on 2 or more days a week.  Maintain a healthy weight. The body mass index (BMI) is a screening tool to identify possible weight problems. It provides an estimate of body fat based on height and weight. Your health care provider can find your BMI and can help you achieve or maintain a healthy weight.For adults 20 years and older:  A BMI below 18.5 is considered underweight.  A BMI of 18.5 to 24.9 is normal.  A BMI of 25 to 29.9 is considered overweight.  A BMI of  30 and above is considered obese.  Maintain normal blood lipids and cholesterol levels by exercising and minimizing your intake of saturated fat. Eat a balanced diet with plenty of fruit and vegetables. Blood tests for lipids and cholesterol should begin at age 76 and be repeated every 5 years. If your lipid or cholesterol levels are high, you are over 50, or you are at high risk for heart disease, you may need your cholesterol levels checked more frequently.Ongoing high lipid and cholesterol levels should be treated with medicines if diet and exercise are not working.  If you smoke, find out from your health care provider how to quit. If you do not use tobacco, do not start.  Lung cancer screening is recommended for adults aged 22-80 years who are at high risk for developing lung cancer because of a history of smoking. A yearly low-dose CT scan of the lungs is recommended for people who have at least a 30-pack-year history of smoking and are a current smoker or have quit within the past 15 years. A pack year of smoking is smoking an average of 1 pack of cigarettes a day for 1 year (for example: 1 pack a day for 30 years or 2 packs a day for 15 years). Yearly screening should continue until the smoker has stopped smoking for at least 15 years. Yearly screening should be stopped for people who develop a health problem that would prevent them from having lung cancer treatment.  If you are pregnant, do not drink alcohol. If you are breastfeeding,  be very cautious about drinking alcohol. If you are not pregnant and choose to drink alcohol, do not have more than 1 drink per day. One drink is considered to be 12 ounces (355 mL) of beer, 5 ounces (148 mL) of wine, or 1.5 ounces (44 mL) of liquor.  Avoid use of street drugs. Do not share needles with anyone. Ask for help if you need support or instructions about stopping the use of drugs.  High blood pressure causes heart disease and increases the risk of  stroke. Your blood pressure should be checked at least every 1 to 2 years. Ongoing high blood pressure should be treated with medicines if weight loss and exercise do not work.  If you are 75-52 years old, ask your health care provider if you should take aspirin to prevent strokes.  Diabetes screening involves taking a blood sample to check your fasting blood sugar level. This should be done once every 3 years, after age 15, if you are within normal weight and without risk factors for diabetes. Testing should be considered at a younger age or be carried out more frequently if you are overweight and have at least 1 risk factor for diabetes.  Breast cancer screening is essential preventive care for women. You should practice "breast self-awareness." This means understanding the normal appearance and feel of your breasts and may include breast self-examination. Any changes detected, no matter how small, should be reported to a health care provider. Women in their 58s and 30s should have a clinical breast exam (CBE) by a health care provider as part of a regular health exam every 1 to 3 years. After age 16, women should have a CBE every year. Starting at age 53, women should consider having a mammogram (breast X-ray test) every year. Women who have a family history of breast cancer should talk to their health care provider about genetic screening. Women at a high risk of breast cancer should talk to their health care providers about having an MRI and a mammogram every year.  Breast cancer gene (BRCA)-related cancer risk assessment is recommended for women who have family members with BRCA-related cancers. BRCA-related cancers include breast, ovarian, tubal, and peritoneal cancers. Having family members with these cancers may be associated with an increased risk for harmful changes (mutations) in the breast cancer genes BRCA1 and BRCA2. Results of the assessment will determine the need for genetic counseling and  BRCA1 and BRCA2 testing.  Routine pelvic exams to screen for cancer are no longer recommended for nonpregnant women who are considered low risk for cancer of the pelvic organs (ovaries, uterus, and vagina) and who do not have symptoms. Ask your health care provider if a screening pelvic exam is right for you.  If you have had past treatment for cervical cancer or a condition that could lead to cancer, you need Pap tests and screening for cancer for at least 20 years after your treatment. If Pap tests have been discontinued, your risk factors (such as having a new sexual partner) need to be reassessed to determine if screening should be resumed. Some women have medical problems that increase the chance of getting cervical cancer. In these cases, your health care provider may recommend more frequent screening and Pap tests.  The HPV test is an additional test that may be used for cervical cancer screening. The HPV test looks for the virus that can cause the cell changes on the cervix. The cells collected during the Pap test can be  tested for HPV. The HPV test could be used to screen women aged 30 years and older, and should be used in women of any age who have unclear Pap test results. After the age of 30, women should have HPV testing at the same frequency as a Pap test.  Colorectal cancer can be detected and often prevented. Most routine colorectal cancer screening begins at the age of 50 years and continues through age 75 years. However, your health care provider may recommend screening at an earlier age if you have risk factors for colon cancer. On a yearly basis, your health care provider may provide home test kits to check for hidden blood in the stool. Use of a small camera at the end of a tube, to directly examine the colon (sigmoidoscopy or colonoscopy), can detect the earliest forms of colorectal cancer. Talk to your health care provider about this at age 50, when routine screening begins. Direct  exam of the colon should be repeated every 5-10 years through age 75 years, unless early forms of pre-cancerous polyps or small growths are found.  People who are at an increased risk for hepatitis B should be screened for this virus. You are considered at high risk for hepatitis B if:  You were born in a country where hepatitis B occurs often. Talk with your health care provider about which countries are considered high risk.  Your parents were born in a high-risk country and you have not received a shot to protect against hepatitis B (hepatitis B vaccine).  You have HIV or AIDS.  You use needles to inject street drugs.  You live with, or have sex with, someone who has hepatitis B.  You get hemodialysis treatment.  You take certain medicines for conditions like cancer, organ transplantation, and autoimmune conditions.  Hepatitis C blood testing is recommended for all people born from 1945 through 1965 and any individual with known risks for hepatitis C.  Practice safe sex. Use condoms and avoid high-risk sexual practices to reduce the spread of sexually transmitted infections (STIs). STIs include gonorrhea, chlamydia, syphilis, trichomonas, herpes, HPV, and human immunodeficiency virus (HIV). Herpes, HIV, and HPV are viral illnesses that have no cure. They can result in disability, cancer, and death.  You should be screened for sexually transmitted illnesses (STIs) including gonorrhea and chlamydia if:  You are sexually active and are younger than 24 years.  You are older than 24 years and your health care provider tells you that you are at risk for this type of infection.  Your sexual activity has changed since you were last screened and you are at an increased risk for chlamydia or gonorrhea. Ask your health care provider if you are at risk.  If you are at risk of being infected with HIV, it is recommended that you take a prescription medicine daily to prevent HIV infection. This is  called preexposure prophylaxis (PrEP). You are considered at risk if:  You are a heterosexual woman, are sexually active, and are at increased risk for HIV infection.  You take drugs by injection.  You are sexually active with a partner who has HIV.  Talk with your health care provider about whether you are at high risk of being infected with HIV. If you choose to begin PrEP, you should first be tested for HIV. You should then be tested every 3 months for as long as you are taking PrEP.  Osteoporosis is a disease in which the bones lose minerals and strength   with aging. This can result in serious bone fractures or breaks. The risk of osteoporosis can be identified using a bone density scan. Women ages 65 years and over and women at risk for fractures or osteoporosis should discuss screening with their health care providers. Ask your health care provider whether you should take a calcium supplement or vitamin D to reduce the rate of osteoporosis.  Menopause can be associated with physical symptoms and risks. Hormone replacement therapy is available to decrease symptoms and risks. You should talk to your health care provider about whether hormone replacement therapy is right for you.  Use sunscreen. Apply sunscreen liberally and repeatedly throughout the day. You should seek shade when your shadow is shorter than you. Protect yourself by wearing long sleeves, pants, a wide-brimmed hat, and sunglasses year round, whenever you are outdoors.  Once a month, do a whole body skin exam, using a mirror to look at the skin on your back. Tell your health care provider of new moles, moles that have irregular borders, moles that are larger than a pencil eraser, or moles that have changed in shape or color.  Stay current with required vaccines (immunizations).  Influenza vaccine. All adults should be immunized every year.  Tetanus, diphtheria, and acellular pertussis (Td, Tdap) vaccine. Pregnant women should  receive 1 dose of Tdap vaccine during each pregnancy. The dose should be obtained regardless of the length of time since the last dose. Immunization is preferred during the 27th-36th week of gestation. An adult who has not previously received Tdap or who does not know her vaccine status should receive 1 dose of Tdap. This initial dose should be followed by tetanus and diphtheria toxoids (Td) booster doses every 10 years. Adults with an unknown or incomplete history of completing a 3-dose immunization series with Td-containing vaccines should begin or complete a primary immunization series including a Tdap dose. Adults should receive a Td booster every 10 years.  Varicella vaccine. An adult without evidence of immunity to varicella should receive 2 doses or a second dose if she has previously received 1 dose. Pregnant females who do not have evidence of immunity should receive the first dose after pregnancy. This first dose should be obtained before leaving the health care facility. The second dose should be obtained 4-8 weeks after the first dose.  Human papillomavirus (HPV) vaccine. Females aged 13-26 years who have not received the vaccine previously should obtain the 3-dose series. The vaccine is not recommended for use in pregnant females. However, pregnancy testing is not needed before receiving a dose. If a female is found to be pregnant after receiving a dose, no treatment is needed. In that case, the remaining doses should be delayed until after the pregnancy. Immunization is recommended for any person with an immunocompromised condition through the age of 26 years if she did not get any or all doses earlier. During the 3-dose series, the second dose should be obtained 4-8 weeks after the first dose. The third dose should be obtained 24 weeks after the first dose and 16 weeks after the second dose.  Zoster vaccine. One dose is recommended for adults aged 60 years or older unless certain conditions are  present.  Measles, mumps, and rubella (MMR) vaccine. Adults born before 1957 generally are considered immune to measles and mumps. Adults born in 1957 or later should have 1 or more doses of MMR vaccine unless there is a contraindication to the vaccine or there is laboratory evidence of immunity to   each of the three diseases. A routine second dose of MMR vaccine should be obtained at least 28 days after the first dose for students attending postsecondary schools, health care workers, or international travelers. People who received inactivated measles vaccine or an unknown type of measles vaccine during 1963-1967 should receive 2 doses of MMR vaccine. People who received inactivated mumps vaccine or an unknown type of mumps vaccine before 1979 and are at high risk for mumps infection should consider immunization with 2 doses of MMR vaccine. For females of childbearing age, rubella immunity should be determined. If there is no evidence of immunity, females who are not pregnant should be vaccinated. If there is no evidence of immunity, females who are pregnant should delay immunization until after pregnancy. Unvaccinated health care workers born before 1957 who lack laboratory evidence of measles, mumps, or rubella immunity or laboratory confirmation of disease should consider measles and mumps immunization with 2 doses of MMR vaccine or rubella immunization with 1 dose of MMR vaccine.  Pneumococcal 13-valent conjugate (PCV13) vaccine. When indicated, a person who is uncertain of her immunization history and has no record of immunization should receive the PCV13 vaccine. An adult aged 19 years or older who has certain medical conditions and has not been previously immunized should receive 1 dose of PCV13 vaccine. This PCV13 should be followed with a dose of pneumococcal polysaccharide (PPSV23) vaccine. The PPSV23 vaccine dose should be obtained at least 8 weeks after the dose of PCV13 vaccine. An adult aged 19  years or older who has certain medical conditions and previously received 1 or more doses of PPSV23 vaccine should receive 1 dose of PCV13. The PCV13 vaccine dose should be obtained 1 or more years after the last PPSV23 vaccine dose.  Pneumococcal polysaccharide (PPSV23) vaccine. When PCV13 is also indicated, PCV13 should be obtained first. All adults aged 65 years and older should be immunized. An adult younger than age 65 years who has certain medical conditions should be immunized. Any person who resides in a nursing home or long-term care facility should be immunized. An adult smoker should be immunized. People with an immunocompromised condition and certain other conditions should receive both PCV13 and PPSV23 vaccines. People with human immunodeficiency virus (HIV) infection should be immunized as soon as possible after diagnosis. Immunization during chemotherapy or radiation therapy should be avoided. Routine use of PPSV23 vaccine is not recommended for American Indians, [place] Natives, or people younger than 65 years unless there are medical conditions that require PPSV23 vaccine. When indicated, people who have unknown immunization and have no record of immunization should receive PPSV23 vaccine. One-time revaccination 5 years after the first dose of PPSV23 is recommended for people aged 19-64 years who have chronic kidney failure, nephrotic syndrome, asplenia, or immunocompromised conditions. People who received 1-2 doses of PPSV23 before age 65 years should receive another dose of PPSV23 vaccine at age 65 years or later if at least 5 years have passed since the previous dose. Doses of PPSV23 are not needed for people immunized with PPSV23 at or after age 65 years.  Meningococcal vaccine. Adults with asplenia or persistent complement component deficiencies should receive 2 doses of quadrivalent meningococcal conjugate (MenACWY-D) vaccine. The doses should be obtained at least 2 months apart.  Microbiologists working with certain meningococcal bacteria, military recruits, people at risk during an outbreak, and people who travel to or live in countries with a high rate of meningitis should be immunized. A first-year college student up through age   21 years who is living in a residence hall should receive a dose if she did not receive a dose on or after her 16th birthday. Adults who have certain high-risk conditions should receive one or more doses of vaccine.  Hepatitis A vaccine. Adults who wish to be protected from this disease, have certain high-risk conditions, work with hepatitis A-infected animals, work in hepatitis A research labs, or travel to or work in countries with a high rate of hepatitis A should be immunized. Adults who were previously unvaccinated and who anticipate close contact with an international adoptee during the first 60 days after arrival in the [place] from a country with a high rate of hepatitis A should be immunized.  Hepatitis B vaccine. Adults who wish to be protected from this disease, have certain high-risk conditions, may be exposed to blood or other infectious body fluids, are household contacts or sex partners of hepatitis B positive people, are clients or workers in certain care facilities, or travel to or work in countries with a high rate of hepatitis B should be immunized.  Haemophilus influenzae type b (Hib) vaccine. A previously unvaccinated person with asplenia or sickle cell disease or having a scheduled splenectomy should receive 1 dose of Hib vaccine. Regardless of previous immunization, a recipient of a hematopoietic stem cell transplant should receive a 3-dose series 6-12 months after her successful transplant. Hib vaccine is not recommended for adults with HIV infection. Preventive Services / Frequency Ages 64 to 68 years  Blood pressure check.** / Every 1 to 2 years.  Lipid and cholesterol check.** / Every 5 years beginning at age  22.  Clinical breast exam.** / Every 3 years for women in their 88s and 53s.  BRCA-related cancer risk assessment.** / For women who have family members with a BRCA-related cancer (breast, ovarian, tubal, or peritoneal cancers).  Pap test.** / Every 2 years from ages 90 through 51. Every 3 years starting at age 21 through age 56 or 3 with a history of 3 consecutive normal Pap tests.  HPV screening.** / Every 3 years from ages 24 through ages 1 to 46 with a history of 3 consecutive normal Pap tests.  Hepatitis C blood test.** / For any individual with known risks for hepatitis C.  Skin self-exam. / Monthly.  Influenza vaccine. / Every year.  Tetanus, diphtheria, and acellular pertussis (Tdap, Td) vaccine.** / Consult your health care provider. Pregnant women should receive 1 dose of Tdap vaccine during each pregnancy. 1 dose of Td every 10 years.  Varicella vaccine.** / Consult your health care provider. Pregnant females who do not have evidence of immunity should receive the first dose after pregnancy.  HPV vaccine. / 3 doses over 6 months, if 72 and younger. The vaccine is not recommended for use in pregnant females. However, pregnancy testing is not needed before receiving a dose.  Measles, mumps, rubella (MMR) vaccine.** / You need at least 1 dose of MMR if you were born in 1957 or later. You may also need a 2nd dose. For females of childbearing age, rubella immunity should be determined. If there is no evidence of immunity, females who are not pregnant should be vaccinated. If there is no evidence of immunity, females who are pregnant should delay immunization until after pregnancy.  Pneumococcal 13-valent conjugate (PCV13) vaccine.** / Consult your health care provider.  Pneumococcal polysaccharide (PPSV23) vaccine.** / 1 to 2 doses if you smoke cigarettes or if you have certain conditions.  Meningococcal vaccine.** /  1 dose if you are age 19 to 21 years and a first-year college  student living in a residence hall, or have one of several medical conditions, you need to get vaccinated against meningococcal disease. You may also need additional booster doses.  Hepatitis A vaccine.** / Consult your health care provider.  Hepatitis B vaccine.** / Consult your health care provider.  Haemophilus influenzae type b (Hib) vaccine.** / Consult your health care provider. Ages 40 to 64 years  Blood pressure check.** / Every 1 to 2 years.  Lipid and cholesterol check.** / Every 5 years beginning at age 20 years.  Lung cancer screening. / Every year if you are aged 55-80 years and have a 30-pack-year history of smoking and currently smoke or have quit within the past 15 years. Yearly screening is stopped once you have quit smoking for at least 15 years or develop a health problem that would prevent you from having lung cancer treatment.  Clinical breast exam.** / Every year after age 40 years.  BRCA-related cancer risk assessment.** / For women who have family members with a BRCA-related cancer (breast, ovarian, tubal, or peritoneal cancers).  Mammogram.** / Every year beginning at age 40 years and continuing for as long as you are in good health. Consult with your health care provider.  Pap test.** / Every 3 years starting at age 30 years through age 65 or 70 years with a history of 3 consecutive normal Pap tests.  HPV screening.** / Every 3 years from ages 30 years through ages 65 to 70 years with a history of 3 consecutive normal Pap tests.  Fecal occult blood test (FOBT) of stool. / Every year beginning at age 50 years and continuing until age 75 years. You may not need to do this test if you get a colonoscopy every 10 years.  Flexible sigmoidoscopy or colonoscopy.** / Every 5 years for a flexible sigmoidoscopy or every 10 years for a colonoscopy beginning at age 50 years and continuing until age 75 years.  Hepatitis C blood test.** / For all people born from 1945 through  1965 and any individual with known risks for hepatitis C.  Skin self-exam. / Monthly.  Influenza vaccine. / Every year.  Tetanus, diphtheria, and acellular pertussis (Tdap/Td) vaccine.** / Consult your health care provider. Pregnant women should receive 1 dose of Tdap vaccine during each pregnancy. 1 dose of Td every 10 years.  Varicella vaccine.** / Consult your health care provider. Pregnant females who do not have evidence of immunity should receive the first dose after pregnancy.  Zoster vaccine.** / 1 dose for adults aged 60 years or older.  Measles, mumps, rubella (MMR) vaccine.** / You need at least 1 dose of MMR if you were born in 1957 or later. You may also need a 2nd dose. For females of childbearing age, rubella immunity should be determined. If there is no evidence of immunity, females who are not pregnant should be vaccinated. If there is no evidence of immunity, females who are pregnant should delay immunization until after pregnancy.  Pneumococcal 13-valent conjugate (PCV13) vaccine.** / Consult your health care provider.  Pneumococcal polysaccharide (PPSV23) vaccine.** / 1 to 2 doses if you smoke cigarettes or if you have certain conditions.  Meningococcal vaccine.** / Consult your health care provider.  Hepatitis A vaccine.** / Consult your health care provider.  Hepatitis B vaccine.** / Consult your health care provider.  Haemophilus influenzae type b (Hib) vaccine.** / Consult your health care provider. Ages 65   years and over  Blood pressure check.** / Every 1 to 2 years.  Lipid and cholesterol check.** / Every 5 years beginning at age 22 years.  Lung cancer screening. / Every year if you are aged 73-80 years and have a 30-pack-year history of smoking and currently smoke or have quit within the past 15 years. Yearly screening is stopped once you have quit smoking for at least 15 years or develop a health problem that would prevent you from having lung cancer  treatment.  Clinical breast exam.** / Every year after age 4 years.  BRCA-related cancer risk assessment.** / For women who have family members with a BRCA-related cancer (breast, ovarian, tubal, or peritoneal cancers).  Mammogram.** / Every year beginning at age 40 years and continuing for as long as you are in good health. Consult with your health care provider.  Pap test.** / Every 3 years starting at age 9 years through age 34 or 91 years with 3 consecutive normal Pap tests. Testing can be stopped between 65 and 70 years with 3 consecutive normal Pap tests and no abnormal Pap or HPV tests in the past 10 years.  HPV screening.** / Every 3 years from ages 57 years through ages 64 or 45 years with a history of 3 consecutive normal Pap tests. Testing can be stopped between 65 and 70 years with 3 consecutive normal Pap tests and no abnormal Pap or HPV tests in the past 10 years.  Fecal occult blood test (FOBT) of stool. / Every year beginning at age 15 years and continuing until age 17 years. You may not need to do this test if you get a colonoscopy every 10 years.  Flexible sigmoidoscopy or colonoscopy.** / Every 5 years for a flexible sigmoidoscopy or every 10 years for a colonoscopy beginning at age 86 years and continuing until age 71 years.  Hepatitis C blood test.** / For all people born from [birth year] through 1963 and any individual with known risks for hepatitis C.  Osteoporosis screening.** / A one-time screening for women ages 83 years and over and women at risk for fractures or osteoporosis.  Skin self-exam. / Monthly.  Influenza vaccine. / Every year.  Tetanus, diphtheria, and acellular pertussis (Tdap/Td) vaccine.** / 1 dose of Td every 10 years.  Varicella vaccine.** / Consult your health care provider.  Zoster vaccine.** / 1 dose for adults aged 61 years or older.  Pneumococcal 13-valent conjugate (PCV13) vaccine.** / Consult your health care provider.  Pneumococcal  polysaccharide (PPSV23) vaccine.** / 1 dose for all adults aged 28 years and older.  Meningococcal vaccine.** / Consult your health care provider.  Hepatitis A vaccine.** / Consult your health care provider.  Hepatitis B vaccine.** / Consult your health care provider.  Haemophilus influenzae type b (Hib) vaccine.** / Consult your health care provider. ** Family history and personal history of risk and conditions may change your health care provider's recommendations. Document Released: 11/25/2001 Document Revised: 02/13/2014 Document Reviewed: 02/24/2011 [facility] Patient Information 2015 [place], [place]. This information is not intended to replace advice given to you by your health care provider. Make sure you discuss any questions you have with your health care provider.

## 2014-11-06 NOTE — Progress Notes (Signed)
    GYNECOLOGY CLINIC ANNUAL PREVENTATIVE CARE ENCOUNTER NOTE  Subjective:     Betty Shelton is a 29 y.o. G2P105211female here for a routine annual gynecologic exam.  Current complaints: recurrent malodorous vaginal discharge concerning for recurrent BV, has been treated many times in past.  Patient does not want to continue taking antibiotics for BV as this gives her yeast infections, interested in alternative therapies.   Gynecologic History Patient's last menstrual period was 10/23/2014. Contraception: OCP (estrogen/progesterone) Last Pap: cannot remember exact date but had normal paps   Obstetric History OB History  Gravida Para Term Preterm AB SAB TAB Ectopic Multiple Living  2 1 1  0 1 1 0 0 0 1    # Outcome Date GA Lbr Len/2nd Weight Sex Delivery Anes PTL Lv  2 Term 07/28/09 4325w0d  5 lb 10 oz (2.551 kg) F CS-LTranv  N Y  1 SAB               History   Social History  . Marital Status: Single    Spouse Name: N/A    Number of Children: N/A  . Years of Education: N/A   Occupational History  . Not on file.   Social History Main Topics  . Smoking status: Current Every Day Smoker -- 0.25 packs/day for 8 years    Types: Cigarettes  . Smokeless tobacco: Never Used  . Alcohol Use: Yes     Comment: socially  . Drug Use: No  . Sexual Activity: Yes    Birth Control/ Protection: IUD, Pill   Other Topics Concern  . Not on file   Social History Narrative    The following portions of the patient's history were reviewed and updated as appropriate: allergies, current medications, past family history, past medical history, past social history, past surgical history and problem list.  Review of Systems Pertinent items are noted in HPI.   Objective:   BP 129/88 mmHg  Pulse 77  Ht 5\' 5"  (1.651 m)  Wt 322 lb (146.058 kg)  BMI 53.58 kg/m2  LMP 10/23/2014 GENERAL: Well-developed, obese female in no acute distress.  HEENT: Normocephalic, atraumatic. Sclerae anicteric.    NECK: Supple. Normal thyroid.  LUNGS: Clear to auscultation bilaterally.  HEART: Regular rate and rhythm.  BREASTS: Large, symmetric in size. No masses, skin changes, nipple drainage, or lymphadenopathy.  ABDOMEN: Soft, obese, nontender, nondistended. No organomegaly palpated. PELVIC: Normal external female genitalia. Vagina is pink and rugated. Thin, white malodorous discharge, wet prep obtained. Normal cervix contour. Pap smear obtained. Unable to palpate uterus or adnexa secondary to habitus  EXTREMITIES: No cyanosis, clubbing, or edema, 2+ distal pulses.   Assessment:   Annual gynecologic examination Recurrent abnormal vaginal discharge OCP surveillance   Plan:   Pap and wet prep done, will follow up results and manage accordingly. Discussed using boric acid capsules to help in reestablishing proper vaginal pH, patient is interested in this for now. Prescription written for patient.  Patient was told that it was standard of care to treat with prolonged metronidazole therapy for recurrent BV, she declines this for now. Will monitor response to boric acid therapy. OCPs refilled for one year. Routine preventative health maintenance measures emphasized   Jaynie CollinsUGONNA  Amear Strojny, MD, FACOG Attending Obstetrician & Gynecologist [facility]

## 2014-11-06 NOTE — Addendum Note (Signed)
Addended by: Tandy GawHINTON, Apurva Reily C on: 11/06/2014 03:24 PM   Modules accepted: Orders

## 2014-11-07 ENCOUNTER — Encounter: Payer: Self-pay | Admitting: Obstetrics & Gynecology

## 2014-11-07 LAB — CYTOLOGY - PAP

## 2014-11-07 LAB — WET PREP, GENITAL
Trich, Wet Prep: NONE SEEN
YEAST WET PREP: NONE SEEN

## 2014-11-08 MED ORDER — FLUCONAZOLE 150 MG PO TABS: 150.0000 mg | ORAL_TABLET | Freq: Once | ORAL | Status: DC

## 2014-11-08 MED ORDER — METRONIDAZOLE 500 MG PO TABS: 500.0000 mg | ORAL_TABLET | Freq: Two times a day (BID) | ORAL | Status: AC

## 2014-11-08 NOTE — Addendum Note (Signed)
Addended by: Jaynie CollinsANYANWU, Jaylaa Gallion A on: 11/08/2014 12:44 PM   Modules accepted: Orders

## 2015-01-01 ENCOUNTER — Other Ambulatory Visit (INDEPENDENT_AMBULATORY_CARE_PROVIDER_SITE_OTHER): Payer: BLUE CROSS/BLUE SHIELD | Admitting: *Deleted

## 2015-01-01 DIAGNOSIS — N912 Amenorrhea, unspecified: Secondary | ICD-10-CM | POA: Diagnosis not present

## 2015-01-01 LAB — POCT URINE PREGNANCY: Preg Test, Ur: NEGATIVE

## 2015-01-01 NOTE — Progress Notes (Signed)
Pt walked in to get a UPT to confirm pregnancy.

## 2015-01-11 ENCOUNTER — Telehealth: Payer: Self-pay | Admitting: *Deleted

## 2015-01-11 NOTE — Telephone Encounter (Signed)
Pt called in stating she has lower back pain and left side pain. Pt states she was told that she was pregnant while in office on 01/01/15 and wanted to know it if was okay to take Tylenol for pain. I explained to pt that the UPT in system states it was negative. Pt states she was aware the test result in the system says negative but she has new OB appt scheduled because the test was in fact positive. I asked the patient GA but she was unsure of LMP and thinks it is about 7weeks. I explained to pt that she would need to use heat and ice to relieve pain and tylenol prn. Pt expressed understanding.

## 2015-01-12 ENCOUNTER — Telehealth: Payer: Self-pay | Admitting: *Deleted

## 2015-01-12 NOTE — Telephone Encounter (Signed)
Patient UPT in office was positive.  It was entered in error on 01/01/2015.  Pt scheduled for new OB on 01/16/2015.

## 2015-01-14 ENCOUNTER — Inpatient Hospital Stay (HOSPITAL_COMMUNITY)
Admission: AD | Admit: 2015-01-14 | Discharge: 2015-01-14 | Disposition: A | Discharge status: Home / Self Care | Payer: BLUE CROSS/BLUE SHIELD | Source: Ambulatory Visit | Attending: Obstetrics & Gynecology | Admitting: Obstetrics & Gynecology

## 2015-01-14 ENCOUNTER — Encounter (HOSPITAL_COMMUNITY): Payer: Self-pay | Admitting: *Deleted

## 2015-01-14 DIAGNOSIS — M545 Low back pain, unspecified: Secondary | ICD-10-CM

## 2015-01-14 DIAGNOSIS — O26891 Other specified pregnancy related conditions, first trimester: Secondary | ICD-10-CM

## 2015-01-14 DIAGNOSIS — O99331 Smoking (tobacco) complicating pregnancy, first trimester: Secondary | ICD-10-CM | POA: Insufficient documentation

## 2015-01-14 DIAGNOSIS — F17210 Nicotine dependence, cigarettes, uncomplicated: Secondary | ICD-10-CM | POA: Diagnosis not present

## 2015-01-14 DIAGNOSIS — O9989 Other specified diseases and conditions complicating pregnancy, childbirth and the puerperium: Secondary | ICD-10-CM | POA: Insufficient documentation

## 2015-01-14 DIAGNOSIS — O2691 Pregnancy related conditions, unspecified, first trimester: Secondary | ICD-10-CM

## 2015-01-14 DIAGNOSIS — Z3A01 Less than 8 weeks gestation of pregnancy: Secondary | ICD-10-CM | POA: Insufficient documentation

## 2015-01-14 DIAGNOSIS — O99211 Obesity complicating pregnancy, first trimester: Secondary | ICD-10-CM | POA: Insufficient documentation

## 2015-01-14 DIAGNOSIS — Z6841 Body Mass Index (BMI) 40.0 and over, adult: Secondary | ICD-10-CM | POA: Insufficient documentation

## 2015-01-14 LAB — URINALYSIS, ROUTINE W REFLEX MICROSCOPIC
Bilirubin Urine: NEGATIVE
GLUCOSE, UA: NEGATIVE mg/dL
Hgb urine dipstick: NEGATIVE
Ketones, ur: 15 mg/dL — AB
Leukocytes, UA: NEGATIVE
Nitrite: NEGATIVE
PH: 8.5 — AB (ref 5.0–8.0)
Protein, ur: 30 mg/dL — AB
SPECIFIC GRAVITY, URINE: 1.02 (ref 1.005–1.030)
Urobilinogen, UA: 1 mg/dL (ref 0.0–1.0)

## 2015-01-14 LAB — URINE MICROSCOPIC-ADD ON

## 2015-01-14 LAB — POCT PREGNANCY, URINE: Preg Test, Ur: POSITIVE — AB

## 2015-01-14 MED ORDER — ACETAMINOPHEN-CODEINE #3 300-30 MG PO TABS: 1.0000 | ORAL_TABLET | ORAL | Status: DC | PRN

## 2015-01-14 MED ORDER — CYCLOBENZAPRINE HCL 10 MG PO TABS
10.0000 mg | ORAL_TABLET | Freq: Once | ORAL | Status: AC
  Administered 2015-01-14: 10 mg via ORAL
  Filled 2015-01-14: qty 1

## 2015-01-14 MED ORDER — IBUPROFEN 400 MG PO TABS
400.0000 mg | ORAL_TABLET | Freq: Once | ORAL | Status: AC
  Administered 2015-01-14: 400 mg via ORAL
  Filled 2015-01-14: qty 1

## 2015-01-14 MED ORDER — OXYCODONE-ACETAMINOPHEN 5-325 MG PO TABS
1.0000 | ORAL_TABLET | Freq: Once | ORAL | Status: AC
  Administered 2015-01-14: 1 via ORAL
  Filled 2015-01-14: qty 1

## 2015-01-14 MED ORDER — CYCLOBENZAPRINE HCL 10 MG PO TABS: 10.0000 mg | ORAL_TABLET | Freq: Two times a day (BID) | ORAL | Status: DC | PRN

## 2015-01-14 NOTE — MAU Note (Signed)
Back pain started around 1615 that shot down her lower back, she took an epsom salt bath and ibuprofen at 1615.  Denies VB/cramping.

## 2015-01-14 NOTE — MAU Provider Note (Signed)
History     CSN: [id number]  Arrival date and time: 01/14/15 1653   First Provider Initiated Contact with Patient 01/14/15 1742      Chief Complaint  Patient presents with  . Spasms   HPI 29 y.o. G3P1011 at [redacted]w[redacted]d w/ severe lower back pain of sudden onset about 1 hour prior to MAU arrival. Pt states she previously had some mild low back pain, then was bending over to dry her legs after a bath today and had sudden, sharp shooting pain in her lower back. Pt states she feels like she's having "back spasms", pain worsens w/ movement and position change, improves w/ lying still. Took 400 mg ibuprofen at 1620 with little relief.   Past Medical History  Diagnosis Date  . Morbid obesity with BMI of 50.0-59.9, adult     Past Surgical History  Procedure Laterality Date  . Cesarean section    . Tooth extraction  2014  . Wisdom tooth extraction    . Iud removal N/A 11/28/2013    Procedure: INTRAUTERINE DEVICE (IUD) REMOVAL;  Surgeon: Allie Bossier, MD;  Location: [facility];  Service: Gynecology;  Laterality: N/A;    Family History  Problem Relation Age of Onset  . Diabetes Mother   . Cancer Maternal Aunt     breast  . Diabetes Maternal Grandmother   . Hypertension Maternal Grandmother     History  Substance Use Topics  . Smoking status: Current Every Day Smoker -- 0.25 packs/day for 8 years    Types: Cigarettes  . Smokeless tobacco: Never Used  . Alcohol Use: Yes     Comment: socially    Allergies:  Allergies  Allergen Reactions  . Mucinex [Guaifenesin Er] Hives and Swelling    No prescriptions prior to admission    Review of Systems  Constitutional: Negative.  Negative for fever and chills.  Respiratory: Negative.   Cardiovascular: Negative.   Gastrointestinal: Negative for nausea, vomiting, abdominal pain, diarrhea and constipation.  Genitourinary: Negative for dysuria, urgency, frequency, hematuria and flank pain.       Negative for vaginal bleeding, vaginal discharge,  dyspareunia  Musculoskeletal: Positive for back pain.  Neurological: Negative.  Negative for tingling, sensory change, focal weakness and weakness.  Psychiatric/Behavioral: Negative.    Physical Exam   Blood pressure 129/58, pulse 104, temperature 98.7 F (37.1 C), temperature source Oral, resp. rate 18, last menstrual period 12/10/2014.  Physical Exam  Nursing note and vitals reviewed. Constitutional: She is oriented to person, place, and time. She appears well-developed. No distress (uncomfortable appearing).  Obese   Cardiovascular: Normal rate.   Respiratory: Effort normal.  Musculoskeletal: She exhibits no tenderness (pt reports some relief of low back pain w/ palpation of lumbar area).  Neurological: She is alert and oriented to person, place, and time.  Skin: Skin is warm and dry.  Psychiatric: She has a normal mood and affect.    MAU Course  Procedures  Results for orders placed or performed during the hospital encounter of 01/14/15 (from the past 24 hour(s))  Urinalysis, Routine w reflex microscopic     Status: Abnormal   Collection Time: 01/14/15  5:00 PM  Result Value Ref Range   Color, Urine YELLOW YELLOW   APPearance CLEAR CLEAR   Specific Gravity, Urine 1.020 1.005 - 1.030   pH 8.5 (H) 5.0 - 8.0   Glucose, UA NEGATIVE NEGATIVE mg/dL   Hgb urine dipstick NEGATIVE NEGATIVE   Bilirubin Urine NEGATIVE NEGATIVE   Ketones, ur  15 (A) NEGATIVE mg/dL   Protein, ur 30 (A) NEGATIVE mg/dL   Urobilinogen, UA 1.0 0.0 - 1.0 mg/dL   Nitrite NEGATIVE NEGATIVE   Leukocytes, UA NEGATIVE NEGATIVE  Urine microscopic-add on     Status: Abnormal   Collection Time: 01/14/15  5:00 PM  Result Value Ref Range   Squamous Epithelial / LPF FEW (A) RARE   WBC, UA 0-2 <3 WBC/hpf   Bacteria, UA FEW (A) RARE   Urine-Other MUCOUS PRESENT   Pregnancy, urine POC     Status: Abnormal   Collection Time: 01/14/15  5:12 PM  Result Value Ref Range   Preg Test, Ur POSITIVE (A) NEGATIVE    Pain decreased from a 7/10 to 4/10 w/ Percocet and Flexeril, pt able to get out of bed with moderate discomfort, able to ambulate independently.    Assessment and Plan   1. Low back pain during pregnancy in first trimester   Appears musculoskeletal, Tylenol #3, Flexeril, may continue Motrin. Ice to back intermittently for next 24 hours. Follow up if symptoms not improving over next few days or worsening.     Medication List    STOP taking these medications        Boric Acid Crys     fluconazole 150 MG tablet  Commonly known as:  DIFLUCAN     norethindrone-ethinyl estradiol-iron 1-20/1-30/1-35 MG-MCG tablet  Commonly known as:  TRI-LEGEST FE      TAKE these medications        acetaminophen-codeine 300-30 MG per tablet  Commonly known as:  TYLENOL #3  Take 1-2 tablets by mouth every 4 (four) hours as needed for moderate pain.     cyclobenzaprine 10 MG tablet  Commonly known as:  FLEXERIL  Take 1 tablet (10 mg total) by mouth 2 (two) times daily as needed for muscle spasms.     ibuprofen 200 MG tablet  Commonly known as:  ADVIL,MOTRIN  Take 400 mg by mouth every 6 (six) hours as needed for moderate pain.        Follow-up Information    Follow [facility] with [facility].   Specialty:  Obstetrics and Gynecology   Why:  as scheduled or sooner as needed   Contact information:   [address] [place] [id number] [phone]        [facility] Center[facility]RAZIER,Sammye Staff 01/14/2015, 8:39 PM

## 2015-01-16 ENCOUNTER — Encounter: Payer: Self-pay | Admitting: Obstetrics & Gynecology

## 2015-01-16 ENCOUNTER — Ambulatory Visit (INDEPENDENT_AMBULATORY_CARE_PROVIDER_SITE_OTHER): Payer: BLUE CROSS/BLUE SHIELD | Admitting: Obstetrics & Gynecology

## 2015-01-16 VITALS — BP 121/82 | HR 79 | Wt 325.0 lb

## 2015-01-16 DIAGNOSIS — Z36 Encounter for antenatal screening of mother: Secondary | ICD-10-CM | POA: Diagnosis not present

## 2015-01-16 DIAGNOSIS — Z3481 Encounter for supervision of other normal pregnancy, first trimester: Secondary | ICD-10-CM | POA: Diagnosis not present

## 2015-01-16 DIAGNOSIS — O99210 Obesity complicating pregnancy, unspecified trimester: Secondary | ICD-10-CM

## 2015-01-16 DIAGNOSIS — Z3480 Encounter for supervision of other normal pregnancy, unspecified trimester: Secondary | ICD-10-CM | POA: Insufficient documentation

## 2015-01-16 MED ORDER — CONCEPT OB 130-92.4-1 MG PO CAPS: 1.0000 | ORAL_CAPSULE | Freq: Every day | ORAL | Status: DC

## 2015-01-16 NOTE — Addendum Note (Signed)
Addended by: Tandy GawHINTON, Lindwood Mogel C on: 01/16/2015 02:06 PM   Modules accepted: Orders

## 2015-01-16 NOTE — Progress Notes (Signed)
   Subjective:    Betty Shelton is a S AA [id number] 5764w1d being seen today for her first obstetrical visit.  Her obstetrical history is significant for morbid obesity. Patient does intend to breast feed. Pregnancy history fully reviewed.  Patient reports backache. She went to the MAU last week and was given flexeril and Tylenol #3.  Filed Vitals:   01/16/15 1335  BP: 121/82  Pulse: 79  Weight: 325 lb (147.419 kg)    HISTORY: OB History  Gravida Para Term Preterm AB SAB TAB Ectopic Multiple Living  3 1 1  0 1 1 0 0 0 1    # Outcome Date GA Lbr Len/2nd Weight Sex Delivery Anes PTL Lv  3 Current           2 Term 07/28/09 5874w0d  5 lb 10 oz (2.551 kg) F CS-LTranv  N Y  1 SAB              Past Medical History  Diagnosis Date  . Morbid obesity with BMI of 50.0-59.9, adult    Past Surgical History  Procedure Laterality Date  . Cesarean section    . Tooth extraction  2014  . Wisdom tooth extraction    . Iud removal N/A 11/28/2013    Procedure: INTRAUTERINE DEVICE (IUD) REMOVAL;  Surgeon: Allie BossierMyra C Ardis Fullwood, MD;  Location: [facility];  Service: Gynecology;  Laterality: N/A;   Family History  Problem Relation Age of Onset  . Diabetes Mother   . Cancer Maternal Aunt     breast  . Diabetes Maternal Grandmother   . Hypertension Maternal Grandmother      Exam    Uterus:     Pelvic Exam:    Perineum: No Hemorrhoids   Vulva: normal   Vagina:  normal mucosa   pH:    Cervix: anteverted   Adnexa: normal adnexa   Bony Pelvis: android  System: Breast:  normal appearance, no masses or tenderness   Skin: normal coloration and turgor, no rashes    Neurologic: oriented   Extremities: normal strength, tone, and muscle mass   HEENT PERRLA   Mouth/Teeth mucous membranes moist, pharynx normal without lesions   Neck supple   Cardiovascular: regular rate and rhythm   Respiratory:  appears well, vitals normal, no respiratory distress, acyanotic, normal RR, ear and throat exam is normal, neck free  of mass or lymphadenopathy, chest clear, no wheezing, crepitations, rhonchi, normal symmetric air entry   Abdomen: soft, non-tender; bowel sounds normal; no masses,  no organomegaly   Urinary: urethral meatus normal      Assessment:    Pregnancy: [id number] Patient Active Problem List   Diagnosis Date Noted  . Obesity in pregnancy 01/16/2015  . Supervision of other normal pregnancy 01/16/2015  . Morbid obesity 01/02/2014        Plan:     Initial labs drawn. Prenatal vitamins. Problem list reviewed and updated. Genetic Screening discussed Quad Screen: declined.  Ultrasound discussed; fetal survey: requested.  Follow up in 2 weeks for early glucola I offered her chiropractor versus orthopodedics for her back pain (sounds like sciatica). She opts for chiro.  Britlee Skolnik C. 01/16/2015

## 2015-01-16 NOTE — Progress Notes (Signed)
Patient is having a lot of back pain, was seen in MAU.  Today bedside ultrasound measures 5511w1d fetus with heartrate.

## 2015-01-16 NOTE — Addendum Note (Signed)
Addended by: Allie BossierVE, Brilyn Tuller C on: 01/16/2015 02:08 PM   Modules accepted: Orders

## 2015-01-17 LAB — PRENATAL PROFILE (SOLSTAS)
Antibody Screen: NEGATIVE
Basophils Absolute: 0 10*3/uL (ref 0.0–0.1)
Basophils Relative: 0 % (ref 0–1)
Eosinophils Absolute: 0.2 10*3/uL (ref 0.0–0.7)
Eosinophils Relative: 2 % (ref 0–5)
HCT: 38.6 % (ref 36.0–46.0)
HIV: NONREACTIVE
Hemoglobin: 12.6 g/dL (ref 12.0–15.0)
Hepatitis B Surface Ag: NEGATIVE
LYMPHS PCT: 35 % (ref 12–46)
Lymphs Abs: 2.7 10*3/uL (ref 0.7–4.0)
MCH: 28.8 pg (ref 26.0–34.0)
MCHC: 32.6 g/dL (ref 30.0–36.0)
MCV: 88.3 fL (ref 78.0–100.0)
MONO ABS: 0.5 10*3/uL (ref 0.1–1.0)
MPV: 10.1 fL (ref 8.6–12.4)
Monocytes Relative: 6 % (ref 3–12)
NEUTROS ABS: 4.4 10*3/uL (ref 1.7–7.7)
Neutrophils Relative %: 57 % (ref 43–77)
PLATELETS: 312 10*3/uL (ref 150–400)
RBC: 4.37 MIL/uL (ref 3.87–5.11)
RDW: 12.8 % (ref 11.5–15.5)
RH TYPE: POSITIVE
Rubella: 8.54 Index — ABNORMAL HIGH (ref <0.90)
WBC: 7.7 10*3/uL (ref 4.0–10.5)

## 2015-01-17 LAB — GC/CHLAMYDIA PROBE AMP, URINE
Chlamydia, Swab/Urine, PCR: NEGATIVE
GC Probe Amp, Urine: NEGATIVE

## 2015-01-17 LAB — CULTURE, OB URINE: Colony Count: >=100000

## 2015-01-30 ENCOUNTER — Ambulatory Visit (INDEPENDENT_AMBULATORY_CARE_PROVIDER_SITE_OTHER): Payer: BLUE CROSS/BLUE SHIELD | Admitting: Family Medicine

## 2015-01-30 ENCOUNTER — Encounter: Payer: Self-pay | Admitting: Family Medicine

## 2015-01-30 VITALS — BP 125/82 | HR 74 | Wt 316.2 lb

## 2015-01-30 DIAGNOSIS — O3421 Maternal care for scar from previous cesarean delivery: Secondary | ICD-10-CM

## 2015-01-30 DIAGNOSIS — O34219 Maternal care for unspecified type scar from previous cesarean delivery: Secondary | ICD-10-CM | POA: Insufficient documentation

## 2015-01-30 DIAGNOSIS — Z36 Encounter for antenatal screening of mother: Secondary | ICD-10-CM

## 2015-01-30 DIAGNOSIS — Z3481 Encounter for supervision of other normal pregnancy, first trimester: Secondary | ICD-10-CM

## 2015-01-30 NOTE — Addendum Note (Signed)
Addended by: Tandy GawHINTON, Morty Ortwein C on: 01/30/2015 04:00 PM   Modules accepted: Orders

## 2015-01-30 NOTE — Patient Instructions (Signed)
First Trimester of Pregnancy The first trimester of pregnancy is from week 1 until the end of week 12 (months 1 through 3). A week after a sperm fertilizes an egg, the egg will implant on the wall of the uterus. This embryo will begin to develop into a baby. Genes from you and your partner are forming the baby. The female genes determine whether the baby is a boy or a girl. At 6-8 weeks, the eyes and face are formed, and the heartbeat can be seen on ultrasound. At the end of 12 weeks, all the baby's organs are formed.  Now that you are pregnant, you will want to do everything you can to have a healthy baby. Two of the most important things are to get good prenatal care and to follow your health care provider's instructions. Prenatal care is all the medical care you receive before the baby's birth. This care will help prevent, find, and treat any problems during the pregnancy and childbirth. BODY CHANGES Your body goes through many changes during pregnancy. The changes vary from woman to woman.   You may gain or lose a couple of pounds at first.  You may feel sick to your stomach (nauseous) and throw up (vomit). If the vomiting is uncontrollable, call your health care provider.  You may tire easily.  You may develop headaches that can be relieved by medicines approved by your health care provider.  You may urinate more often. Painful urination may mean you have a bladder infection.  You may develop heartburn as a result of your pregnancy.  You may develop constipation because certain hormones are causing the muscles that push waste through your intestines to slow down.  You may develop hemorrhoids or swollen, bulging veins (varicose veins).  Your breasts may begin to grow larger and become tender. Your nipples may stick out more, and the tissue that surrounds them (areola) may become darker.  Your gums may bleed and may be sensitive to brushing and flossing.  Dark spots or blotches  (chloasma, mask of pregnancy) may develop on your face. This will likely fade after the baby is born.  Your menstrual periods will stop.  You may have a loss of appetite.  You may develop cravings for certain kinds of food.  You may have changes in your emotions from day to day, such as being excited to be pregnant or being concerned that something may go wrong with the pregnancy and baby.  You may have more vivid and strange dreams.  You may have changes in your hair. These can include thickening of your hair, rapid growth, and changes in texture. Some women also have hair loss during or after pregnancy, or hair that feels dry or thin. Your hair will most likely return to normal after your baby is born. WHAT TO EXPECT AT YOUR PRENATAL VISITS During a routine prenatal visit:  You will be weighed to make sure you and the baby are growing normally.  Your blood pressure will be taken.  Your abdomen will be measured to track your baby's growth.  The fetal heartbeat will be listened to starting around week 10 or 12 of your pregnancy.  Test results from any previous visits will be discussed. Your health care provider may ask you:  How you are feeling.  If you are feeling the baby move.  If you have had any abnormal symptoms, such as leaking fluid, bleeding, severe headaches, or abdominal cramping.  If you have any questions. Other tests   that may be performed during your first trimester include:  Blood tests to find your blood type and to check for the presence of any previous infections. They will also be used to check for low iron levels (anemia) and Rh antibodies. Later in the pregnancy, blood tests for diabetes will be done along with other tests if problems develop.  Urine tests to check for infections, diabetes, or protein in the urine.  An ultrasound to confirm the proper growth and development of the baby.  An amniocentesis to check for possible genetic problems.  Fetal  screens for spina bifida and Down syndrome.  You may need other tests to make sure you and the baby are doing well. HOME CARE INSTRUCTIONS  Medicines  Follow your health care provider's instructions regarding medicine use. Specific medicines may be either safe or unsafe to take during pregnancy.  Take your prenatal vitamins as directed.  If you develop constipation, try taking a stool softener if your health care provider approves. Diet  Eat regular, well-balanced meals. Choose a variety of foods, such as meat or vegetable-based protein, fish, milk and low-fat dairy products, vegetables, fruits, and whole grain breads and cereals. Your health care provider will help you determine the amount of weight gain that is right for you.  Avoid raw meat and uncooked cheese. These carry germs that can cause birth defects in the baby.  Eating four or five small meals rather than three large meals a day may help relieve nausea and vomiting. If you start to feel nauseous, eating a few soda crackers can be helpful. Drinking liquids between meals instead of during meals also seems to help nausea and vomiting.  If you develop constipation, eat more high-fiber foods, such as fresh vegetables or fruit and whole grains. Drink enough fluids to keep your urine clear or pale yellow. Activity and Exercise  Exercise only as directed by your health care provider. Exercising will help you:  Control your weight.  Stay in shape.  Be prepared for labor and delivery.  Experiencing pain or cramping in the lower abdomen or low back is a good sign that you should stop exercising. Check with your health care provider before continuing normal exercises.  Try to avoid standing for long periods of time. Move your legs often if you must stand in one place for a long time.  Avoid heavy lifting.  Wear low-heeled shoes, and practice good posture.  You may continue to have sex unless your health care provider directs you  otherwise. Relief of Pain or Discomfort  Wear a good support bra for breast tenderness.   Take warm sitz baths to soothe any pain or discomfort caused by hemorrhoids. Use hemorrhoid cream if your health care provider approves.   Rest with your legs elevated if you have leg cramps or low back pain.  If you develop varicose veins in your legs, wear support hose. Elevate your feet for 15 minutes, 3-4 times a day. Limit salt in your diet. Prenatal Care  Schedule your prenatal visits by the twelfth week of pregnancy. They are usually scheduled monthly at first, then more often in the last 2 months before delivery.  Write down your questions. Take them to your prenatal visits.  Keep all your prenatal visits as directed by your health care provider. Safety  Wear your seat belt at all times when driving.  Make a list of emergency phone numbers, including numbers for family, friends, the hospital, and police and fire departments. General Tips    Ask your health care provider for a referral to a local prenatal education class. Begin classes no later than at the beginning of month 6 of your pregnancy.  Ask for help if you have counseling or nutritional needs during pregnancy. Your health care provider can offer advice or refer you to specialists for help with various needs.  Do not use hot tubs, steam rooms, or saunas.  Do not douche or use tampons or scented sanitary pads.  Do not cross your legs for long periods of time.  Avoid cat litter boxes and soil used by cats. These carry germs that can cause birth defects in the baby and possibly loss of the fetus by miscarriage or stillbirth.  Avoid all smoking, herbs, alcohol, and medicines not prescribed by your health care provider. Chemicals in these affect the formation and growth of the baby.  Schedule a dentist appointment. At home, brush your teeth with a soft toothbrush and be gentle when you floss. SEEK MEDICAL CARE IF:   You have  dizziness.  You have mild pelvic cramps, pelvic pressure, or nagging pain in the abdominal area.  You have persistent nausea, vomiting, or diarrhea.  You have a bad smelling vaginal discharge.  You have pain with urination.  You notice increased swelling in your face, hands, legs, or ankles. SEEK IMMEDIATE MEDICAL CARE IF:   You have a fever.  You are leaking fluid from your vagina.  You have spotting or bleeding from your vagina.  You have severe abdominal cramping or pain.  You have rapid weight gain or loss.  You vomit blood or material that looks like coffee grounds.  You are exposed to German measles and have never had them.  You are exposed to fifth disease or chickenpox.  You develop a severe headache.  You have shortness of breath.  You have any kind of trauma, such as from a fall or a car accident. Document Released: 09/23/2001 Document Revised: 02/13/2014 Document Reviewed: 08/09/2013 [employer] Patient Information 2015 [employer]. This information is not intended to replace advice given to you by your health care provider. Make sure you discuss any questions you have with your health care provider.  Breastfeeding Deciding to breastfeed is one of the best choices you can make for you and your baby. A change in hormones during pregnancy causes your breast tissue to grow and increases the number and size of your milk ducts. These hormones also allow proteins, sugars, and fats from your blood supply to make breast milk in your milk-producing glands. Hormones prevent breast milk from being released before your baby is born as well as prompt milk flow after birth. Once breastfeeding has begun, thoughts of your baby, as well as his or her sucking or crying, can stimulate the release of milk from your milk-producing glands.  BENEFITS OF BREASTFEEDING For Your Baby  Your first milk (colostrum) helps your baby's digestive system function better.   There are antibodies  in your milk that help your baby fight off infections.   Your baby has a lower incidence of asthma, allergies, and sudden infant death syndrome.   The nutrients in breast milk are better for your baby than infant formulas and are designed uniquely for your baby's needs.   Breast milk improves your baby's brain development.   Your baby is less likely to develop other conditions, such as childhood obesity, asthma, or type 2 diabetes mellitus.  For You   Breastfeeding helps to create a very special bond between   you and your baby.   Breastfeeding is convenient. Breast milk is always available at the correct temperature and costs nothing.   Breastfeeding helps to burn calories and helps you lose the weight gained during pregnancy.   Breastfeeding makes your uterus contract to its prepregnancy size faster and slows bleeding (lochia) after you give birth.   Breastfeeding helps to lower your risk of developing type 2 diabetes mellitus, osteoporosis, and breast or ovarian cancer later in life. SIGNS THAT YOUR BABY IS HUNGRY Early Signs of Hunger  Increased alertness or activity.  Stretching.  Movement of the head from side to side.  Movement of the head and opening of the mouth when the corner of the mouth or cheek is stroked (rooting).  Increased sucking sounds, smacking lips, cooing, sighing, or squeaking.  Hand-to-mouth movements.  Increased sucking of fingers or hands. Late Signs of Hunger  Fussing.  Intermittent crying. Extreme Signs of Hunger Signs of extreme hunger will require calming and consoling before your baby will be able to breastfeed successfully. Do not wait for the following signs of extreme hunger to occur before you initiate breastfeeding:   Restlessness.  A loud, strong cry.   Screaming. BREASTFEEDING BASICS Breastfeeding Initiation  Find a comfortable place to sit or lie down, with your neck and back well supported.  Place a pillow or  rolled up blanket under your baby to bring him or her to the level of your breast (if you are seated). Nursing pillows are specially designed to help support your arms and your baby while you breastfeed.  Make sure that your baby's abdomen is facing your abdomen.   Gently massage your breast. With your fingertips, massage from your chest wall toward your nipple in a circular motion. This encourages milk flow. You may need to continue this action during the feeding if your milk flows slowly.  Support your breast with 4 fingers underneath and your thumb above your nipple. Make sure your fingers are well away from your nipple and your baby's mouth.   Stroke your baby's lips gently with your finger or nipple.   When your baby's mouth is open wide enough, quickly bring your baby to your breast, placing your entire nipple and as much of the colored area around your nipple (areola) as possible into your baby's mouth.   More areola should be visible above your baby's upper lip than below the lower lip.   Your baby's tongue should be between his or her lower gum and your breast.   Ensure that your baby's mouth is correctly positioned around your nipple (latched). Your baby's lips should create a seal on your breast and be turned out (everted).  It is common for your baby to suck about 2-3 minutes in order to start the flow of breast milk. Latching Teaching your baby how to latch on to your breast properly is very important. An improper latch can cause nipple pain and decreased milk supply for you and poor weight gain in your baby. Also, if your baby is not latched onto your nipple properly, he or she may swallow some air during feeding. This can make your baby fussy. Burping your baby when you switch breasts during the feeding can help to get rid of the air. However, teaching your baby to latch on properly is still the best way to prevent fussiness from swallowing air while breastfeeding. Signs  that your baby has successfully latched on to your nipple:    Silent tugging or silent   sucking, without causing you pain.   Swallowing heard between every 3-4 sucks.    Muscle movement above and in front of his or her ears while sucking.  Signs that your baby has not successfully latched on to nipple:   Sucking sounds or smacking sounds from your baby while breastfeeding.  Nipple pain. If you think your baby has not latched on correctly, slip your finger into the corner of your baby's mouth to break the suction and place it between your baby's gums. Attempt breastfeeding initiation again. Signs of Successful Breastfeeding Signs from your baby:   A gradual decrease in the number of sucks or complete cessation of sucking.   Falling asleep.   Relaxation of his or her body.   Retention of a small amount of milk in his or her mouth.   Letting go of your breast by himself or herself. Signs from you:  Breasts that have increased in firmness, weight, and size 1-3 hours after feeding.   Breasts that are softer immediately after breastfeeding.  Increased milk volume, as well as a change in milk consistency and color by the fifth day of breastfeeding.   Nipples that are not sore, cracked, or bleeding. Signs That Your Baby is Getting Enough Milk  Wetting at least 3 diapers in a 24-hour period. The urine should be clear and pale yellow by age 5 days.  At least 3 stools in a 24-hour period by age 5 days. The stool should be soft and yellow.  At least 3 stools in a 24-hour period by age 7 days. The stool should be seedy and yellow.  No loss of weight greater than 10% of birth weight during the first 3 days of age.  Average weight gain of 4-7 ounces (113-198 g) per week after age 4 days.  Consistent daily weight gain by age 5 days, without weight loss after the age of 2 weeks. After a feeding, your baby may spit up a small amount. This is common. BREASTFEEDING FREQUENCY AND  DURATION Frequent feeding will help you make more milk and can prevent sore nipples and breast engorgement. Breastfeed when you feel the need to reduce the fullness of your breasts or when your baby shows signs of hunger. This is called "breastfeeding on demand." Avoid introducing a pacifier to your baby while you are working to establish breastfeeding (the first 4-6 weeks after your baby is born). After this time you may choose to use a pacifier. Research has shown that pacifier use during the first year of a baby's life decreases the risk of sudden infant death syndrome (SIDS). Allow your baby to feed on each breast as long as he or she wants. Breastfeed until your baby is finished feeding. When your baby unlatches or falls asleep while feeding from the first breast, offer the second breast. Because newborns are often sleepy in the first few weeks of life, you may need to awaken your baby to get him or her to feed. Breastfeeding times will vary from baby to baby. However, the following rules can serve as a guide to help you ensure that your baby is properly fed:  Newborns (babies 4 weeks of age or younger) may breastfeed every 1-3 hours.  Newborns should not go longer than 3 hours during the day or 5 hours during the night without breastfeeding.  You should breastfeed your baby a minimum of 8 times in a 24-hour period until you begin to introduce solid foods to your baby at around 6   months of age. BREAST MILK PUMPING Pumping and storing breast milk allows you to ensure that your baby is exclusively fed your breast milk, even at times when you are unable to breastfeed. This is especially important if you are going back to work while you are still breastfeeding or when you are not able to be present during feedings. Your lactation consultant can give you guidelines on how long it is safe to store breast milk.  A breast pump is a machine that allows you to pump milk from your breast into a sterile bottle.  The pumped breast milk can then be stored in a refrigerator or freezer. Some breast pumps are operated by hand, while others use electricity. Ask your lactation consultant which type will work best for you. Breast pumps can be purchased, but some hospitals and breastfeeding support groups lease breast pumps on a monthly basis. A lactation consultant can teach you how to hand express breast milk, if you prefer not to use a pump.  CARING FOR YOUR BREASTS WHILE YOU BREASTFEED Nipples can become dry, cracked, and sore while breastfeeding. The following recommendations can help keep your breasts moisturized and healthy:  Avoid using soap on your nipples.   Wear a supportive bra. Although not required, special nursing bras and tank tops are designed to allow access to your breasts for breastfeeding without taking off your entire bra or top. Avoid wearing underwire-style bras or extremely tight bras.  Air dry your nipples for 3-4minutes after each feeding.   Use only cotton bra pads to absorb leaked breast milk. Leaking of breast milk between feedings is normal.   Use lanolin on your nipples after breastfeeding. Lanolin helps to maintain your skin's normal moisture barrier. If you use pure lanolin, you do not need to wash it off before feeding your baby again. Pure lanolin is not toxic to your baby. You may also hand express a few drops of breast milk and gently massage that milk into your nipples and allow the milk to air dry. In the first few weeks after giving birth, some women experience extremely full breasts (engorgement). Engorgement can make your breasts feel heavy, warm, and tender to the touch. Engorgement peaks within 3-5 days after you give birth. The following recommendations can help ease engorgement:  Completely empty your breasts while breastfeeding or pumping. You may want to start by applying warm, moist heat (in the shower or with warm water-soaked hand towels) just before feeding or  pumping. This increases circulation and helps the milk flow. If your baby does not completely empty your breasts while breastfeeding, pump any extra milk after he or she is finished.  Wear a snug bra (nursing or regular) or tank top for 1-2 days to signal your body to slightly decrease milk production.  Apply ice packs to your breasts, unless this is too uncomfortable for you.  Make sure that your baby is latched on and positioned properly while breastfeeding. If engorgement persists after 48 hours of following these recommendations, contact your health care provider or a lactation consultant. OVERALL HEALTH CARE RECOMMENDATIONS WHILE BREASTFEEDING  Eat healthy foods. Alternate between meals and snacks, eating 3 of each per day. Because what you eat affects your breast milk, some of the foods may make your baby more irritable than usual. Avoid eating these foods if you are sure that they are negatively affecting your baby.  Drink milk, fruit juice, and water to satisfy your thirst (about 10 glasses a day).   Rest   often, relax, and continue to take your prenatal vitamins to prevent fatigue, stress, and anemia.  Continue breast self-awareness checks.  Avoid chewing and smoking tobacco.  Avoid alcohol and drug use. Some medicines that may be harmful to your baby can pass through breast milk. It is important to ask your health care provider before taking any medicine, including all over-the-counter and prescription medicine as well as vitamin and herbal supplements. It is possible to become pregnant while breastfeeding. If birth control is desired, ask your health care provider about options that will be safe for your baby. SEEK MEDICAL CARE IF:   You feel like you want to stop breastfeeding or have become frustrated with breastfeeding.  You have painful breasts or nipples.  Your nipples are cracked or bleeding.  Your breasts are red, tender, or warm.  You have a swollen area on either  breast.  You have a fever or chills.  You have nausea or vomiting.  You have drainage other than breast milk from your nipples.  Your breasts do not become full before feedings by the fifth day after you give birth.  You feel sad and depressed.  Your baby is too sleepy to eat well.  Your baby is having trouble sleeping.   Your baby is wetting less than 3 diapers in a 24-hour period.  Your baby has less than 3 stools in a 24-hour period.  Your baby's skin or the white part of his or her eyes becomes yellow.   Your baby is not gaining weight by 5 days of age. SEEK IMMEDIATE MEDICAL CARE IF:   Your baby is overly tired (lethargic) and does not want to wake up and feed.  Your baby develops an unexplained fever. Document Released: 09/29/2005 Document Revised: 10/04/2013 Document Reviewed: 03/23/2013 [employer] Patient Information 2015 [employer]. This information is not intended to replace advice given to you by your health care provider. Make sure you discuss any questions you have with your health care provider.  Vaginal Birth After Cesarean Delivery Vaginal birth after cesarean delivery (VBAC) is giving birth vaginally after previously delivering a baby by a cesarean. In the past, if a woman had a cesarean delivery, all births afterward would be done by cesarean delivery. This is no longer true. It can be safe for the mother to try a vaginal delivery after having a cesarean delivery.  It is important to discuss VBAC with your health care provider early in the pregnancy so you can understand the risks, benefits, and options. It will give you time to decide what is best in your particular case. The final decision about whether to have a VBAC or repeat cesarean delivery should be between you and your health care provider. Any changes in your health or your baby's health during your pregnancy may make it necessary to change your initial decision about VBAC.  WOMEN WHO PLAN TO HAVE A  VBAC SHOULD CHECK WITH THEIR HEALTH CARE PROVIDER TO BE SURE THAT:  The previous cesarean delivery was done with a low transverse uterine cut (incision) (not a vertical classical incision).   The birth canal is big enough for the baby.   There were no other operations on the uterus.   An electronic fetal monitor (EFM) will be on at all times during labor.   An operating room will be available and ready in case an emergency cesarean delivery is needed.   A health care provider and surgical nursing staff will be available at all times during   labor to be ready to do an emergency delivery cesarean if necessary.   An anesthesiologist will be present in case an emergency cesarean delivery is needed.   The nursery is prepared and has adequate personnel and necessary equipment available to care for the baby in case of an emergency cesarean delivery. BENEFITS OF VBAC  Shorter stay in the hospital.   Avoidance of risks associated with cesarean delivery, such as:  Surgical complications, such as opening of the incision or hernia in the incision.  Injury to other organs.  Fever. This can occur if an infection develops after surgery. It can also occur as a reaction to the medicine given to make you numb during the surgery.  Less blood loss and need for blood transfusions.  Lower risk of blood clots and infection.  Shorter recovery.   Decreased risk for having to remove the uterus (hysterectomy).   Decreased risk for the placenta to completely or partially cover the opening of the uterus (placenta previa) with a future pregnancy.   Decrease risk in future labor and delivery. RISKS OF A VBAC  Tearing (rupture) of the uterus. This is occurs in less than 1% of VBACs. The risk of this happening is higher if:  Steps are taken to begin the labor process (induce labor) or stimulate or strengthen contractions (augment labor).   Medicine is used to soften (ripen) the  cervix.  Having to remove the uterus (hysterectomy) if it ruptures. VBAC SHOULD NOT BE DONE IF:  The previous cesarean delivery was done with a vertical (classical) or T-shaped incision or you do not know what kind of incision was made.   You had a ruptured uterus.   You have had certain types of surgery on your uterus, such as removal of uterine fibroids. Ask your health care provider about other types of surgeries that prevent you from having a VBAC.  You have certain medical or childbirth (obstetrical) problems.   There are problems with the baby.   You have had two previous cesarean deliveries and no vaginal deliveries. OTHER FACTS TO KNOW ABOUT VBAC:  It is safe to have an epidural anesthetic with VBAC.   It is safe to turn the baby from a breech position (attempt an external cephalic version).   It is safe to try a VBAC with twins.   VBAC may not be successful if your baby weights 8.8 lb (4 kg) or more. However, weight predictions are not always accurate and should not be used alone to decide if VBAC is right for you.  There is an increased failure rate if the time between the cesarean delivery and VBAC is less than 19 months.   Your health care provider may advise against a VBAC if you have preeclampsia (high blood pressure, protein in the urine, and swelling of face and extremities).   VBAC is often successful if you previously gave birth vaginally.   VBAC is often successful when the labor starts spontaneously before the due date.   Delivering a baby through a VBAC is similar to having a normal spontaneous vaginal delivery. Document Released: 03/22/2007 Document Revised: 02/13/2014 Document Reviewed: 04/28/2013 Exit[employer] Patient Information 2015 [employer]. This information is not intended to replace advice given to you by your health care provider. Make sure you discuss any questions you have with your health care provider.  

## 2015-01-30 NOTE — Progress Notes (Signed)
Patient saw a chiropractor for her pain and it has helped tremendously.

## 2015-01-30 NOTE — Progress Notes (Signed)
Has gone to chiropractor and back is improved Feels like something is in her throat--has h/o tonsil stones Declines genetic testing. For early 1 hour today

## 2015-01-31 LAB — GLUCOSE TOLERANCE, 1 HOUR (50G) W/O FASTING: GLUCOSE 1 HOUR GTT: 98 mg/dL (ref 70–140)

## 2015-02-27 ENCOUNTER — Encounter: Payer: Self-pay | Admitting: Obstetrics & Gynecology

## 2015-02-27 ENCOUNTER — Ambulatory Visit (INDEPENDENT_AMBULATORY_CARE_PROVIDER_SITE_OTHER): Payer: BLUE CROSS/BLUE SHIELD | Admitting: Obstetrics & Gynecology

## 2015-02-27 VITALS — BP 128/80 | HR 83 | Wt 312.4 lb

## 2015-02-27 DIAGNOSIS — Z3482 Encounter for supervision of other normal pregnancy, second trimester: Secondary | ICD-10-CM

## 2015-02-27 DIAGNOSIS — O34219 Maternal care for unspecified type scar from previous cesarean delivery: Secondary | ICD-10-CM

## 2015-02-27 DIAGNOSIS — O3421 Maternal care for scar from previous cesarean delivery: Secondary | ICD-10-CM

## 2015-02-27 DIAGNOSIS — E669 Obesity, unspecified: Secondary | ICD-10-CM

## 2015-02-27 DIAGNOSIS — O99212 Obesity complicating pregnancy, second trimester: Secondary | ICD-10-CM

## 2015-02-27 NOTE — Progress Notes (Signed)
Anatomy scan ordered Normal early 1 hr GTT Interested in waterbirth, but was informed TOLAC is a contraindication.  TOLAC consent signed. No other complaints or concerns.  Routine obstetric precautions reviewed.

## 2015-02-27 NOTE — Patient Instructions (Signed)
Return to clinic for any obstetric concerns or go to [facility] for evaluation  Trial of Labor After Cesarean Delivery Information A trial of labor after cesarean delivery (TOLAC) is when a woman tries to give birth vaginally after a previous cesarean delivery. TOLAC may be a safe and appropriate option for you depending on your medical history and other risk factors. When TOLAC is successful and you are able to have a vaginal delivery, this is called a vaginal birth after cesarean delivery (VBAC).  CANDIDATES FOR TOLAC TOLAC is possible for some women who:  Have undergone one or two prior cesarean deliveries in which the incision of the uterus was horizontal (low transverse).  Are carrying twins and have had one prior low transverse incision during a cesarean delivery.  Do not have a vertical (classical) uterine scar.  Have not had a tear in the wall of their uterus (uterine rupture). TOLAC is also supported for women who meet appropriate criteria and:  Are under the age of 40 years.  Are tall and have a body mass index (BMI) of less than 30.  Have an unknown uterine scar.  Give birth in a facility equipped to handle an emergency cesarean delivery. This team should be able to handle possible complications such as a uterine rupture.  Have thorough counseling about the benefits and risks of TOLAC.  Have discussed future pregnancy plans with their health care provider.  Plan to have several more pregnancies. MOST SUCCESSFUL CANDIDATES FOR TOLAC:  Have had a successful vaginal delivery before or after their cesarean delivery.  Experience labor that begins naturally on or before the due date (40 weeks of gestation).  Do not have a very large (macrosomic) baby.   Had a prior cesarean delivery but are not currently experiencing factors that would prompt a cesarean delivery (such as a breech position).  Had only one prior cesarean delivery.  Had a prior cesarean delivery that was  performed early in labor and not after full cervical dilation. TOLAC may be most appropriate for women who meet the above guidelines and who plan to have more pregnancies. TOLAC is not recommended for home births. LEAST SUCCESSFUL CANDIDATES FOR TOLAC:  Have an induced labor with an unfavorable cervix. An unfavorable cervix is when the cervix is not dilating enough (among other factors).  Have never had a vaginal delivery.  Have had more than two cesarean deliveries.  Have a pregnancy at more than 40 weeks of gestation.  Are pregnant with a baby with a suspected weight greater than 4,000 grams (8 pounds) and who have no prior history of a vaginal delivery.  Have closely spaced pregnancies. SUGGESTED BENEFITS OF TOLAC  You may have a faster recovery time.  You may have a shorter stay in the hospital.  You may have less pain and fewer problems than with a cesarean delivery. Women who have a cesarean delivery have a higher chance of needing blood or getting a fever, an infection, or a blood clot in the legs. SUGGESTED RISKS OF TOLAC The highest risk of complications happens to women who attempt a TOLAC and fail. A failed TOLAC results in an unplanned cesarean delivery. Risks related to TOLAC or repeat cesarean deliveries include:   Blood loss.  Infection.  Blood clot.  Injury to surrounding tissues or organs.  Having to remove the uterus (hysterectomy).  Potential problems with the placenta (such as placenta previa or placenta accreta) in future pregnancies. Although very rare, the main concerns with TOLAC are:    Rupture of the uterine scar from a past cesarean delivery.  Needing an emergency cesarean delivery.  Having a bad outcome for the baby (perinatal morbidity). FOR MORE INFORMATION [employer]: www.acog.org [employer] Nurse-Midwives: www.midwife.org Document Released: 06/17/2011 Document Revised: 07/20/2013 Document  Reviewed: 03/21/2013 Exit[employer] Patient Information 2015 [employer]. This information is not intended to replace advice given to you by your health care provider. Make sure you discuss any questions you have with your health care provider.  

## 2015-03-26 ENCOUNTER — Ambulatory Visit (HOSPITAL_COMMUNITY)
Admission: RE | Admit: 2015-03-26 | Discharge: 2015-03-26 | Disposition: A | Discharge status: Home / Self Care | Payer: BLUE CROSS/BLUE SHIELD | Source: Ambulatory Visit | Attending: Obstetrics & Gynecology | Admitting: Obstetrics & Gynecology

## 2015-03-26 ENCOUNTER — Ambulatory Visit (HOSPITAL_COMMUNITY): Payer: BLUE CROSS/BLUE SHIELD

## 2015-03-26 DIAGNOSIS — Z3482 Encounter for supervision of other normal pregnancy, second trimester: Secondary | ICD-10-CM | POA: Insufficient documentation

## 2015-03-26 DIAGNOSIS — O3421 Maternal care for scar from previous cesarean delivery: Secondary | ICD-10-CM | POA: Insufficient documentation

## 2015-03-26 DIAGNOSIS — O34219 Maternal care for unspecified type scar from previous cesarean delivery: Secondary | ICD-10-CM

## 2015-03-26 DIAGNOSIS — Z3A19 19 weeks gestation of pregnancy: Secondary | ICD-10-CM | POA: Insufficient documentation

## 2015-03-26 DIAGNOSIS — Z3689 Encounter for other specified antenatal screening: Secondary | ICD-10-CM | POA: Insufficient documentation

## 2015-03-27 ENCOUNTER — Ambulatory Visit (INDEPENDENT_AMBULATORY_CARE_PROVIDER_SITE_OTHER): Payer: BLUE CROSS/BLUE SHIELD | Admitting: Family Medicine

## 2015-03-27 VITALS — BP 138/82 | HR 108 | Wt 307.0 lb

## 2015-03-27 DIAGNOSIS — Z3482 Encounter for supervision of other normal pregnancy, second trimester: Secondary | ICD-10-CM

## 2015-03-27 DIAGNOSIS — L02422 Furuncle of left axilla: Secondary | ICD-10-CM

## 2015-03-27 DIAGNOSIS — L02429 Furuncle of limb, unspecified: Secondary | ICD-10-CM

## 2015-03-27 NOTE — Progress Notes (Signed)
Subjective:  Betty Shelton is a 29 y.o. G3P1011 at [redacted]w[redacted]d being seen today for ongoing prenatal care.  Patient reports boil under arm, not shaving, cannot lower arm..  Contractions: Not present.  Vag. Bleeding: None. Movement: Present. Denies leaking of fluid.   The following portions of the patient's history were reviewed and updated as appropriate: allergies, current medications, past family history, past medical history, past social history, past surgical history and problem list.   Objective:   Filed Vitals:   03/27/15 1327  BP: 138/82  Pulse: 108  Weight: 307 lb (139.254 kg)    Fetal Status: Fetal Heart Rate (bpm): 148   Movement: Present     General:  Alert, oriented and cooperative. Patient is in no acute distress.  Skin: Skin is warm and dry. No rash noted.   Cardiovascular: Normal heart rate noted  Respiratory: Effort and breath sounds normal, no problems with respiration noted  Abdomen: Soft, gravid, appropriate for gestational age. Pain/Pressure: Absent     Vaginal: Vag. Bleeding: None.    Vag D/C Character: Thin  Cervix: Not evaluated  Extremities: Normal range of motion.  Edema: None  Mental Status: Normal mood and affect. Normal behavior. Normal judgment and thought content.   Procedure: After informed consent, area cleaned with alcohol.  1.5 cc of Lidocaine with Epinephrine infiltrated.  Scalpel used to make a small incision and blood-tinged pus drained from site.  Some induration left.   Assessment and Plan:  Pregnancy: G3P1011 at [redacted]w[redacted]d  1. Encounter for supervision of other normal pregnancy in second trimester Continue routine prenatal care.  F/u to complete anatomy - US OB Follow Up; Future  2. Boil, axilla S/p I and D, warm soaks   Labor symptoms and general obstetric precautions including but not limited to vaginal bleeding, contractions, leaking of fluid and fetal movement were reviewed in detail with the patient.  Please refer to After Visit Summary  for other counseling recommendations.   Return in 4 weeks (on 04/24/2015).   Reva Bores, MD

## 2015-03-27 NOTE — Patient Instructions (Signed)
Second Trimester of Pregnancy The second trimester is from week 13 through week 28, months 4 through 6. The second trimester is often a time when you feel your best. Your body has also adjusted to being pregnant, and you begin to feel better physically. Usually, morning sickness has lessened or quit completely, you may have more energy, and you may have an increase in appetite. The second trimester is also a time when the fetus is growing rapidly. At the end of the sixth month, the fetus is about 9 inches long and weighs about 1 pounds. You will likely begin to feel the baby move (quickening) between 18 and 20 weeks of the pregnancy. BODY CHANGES Your body goes through many changes during pregnancy. The changes vary from woman to woman.   Your weight will continue to increase. You will notice your lower abdomen bulging out.  You may begin to get stretch marks on your hips, abdomen, and breasts.  You may develop headaches that can be relieved by medicines approved by your health care provider.  You may urinate more often because the fetus is pressing on your bladder.  You may develop or continue to have heartburn as a result of your pregnancy.  You may develop constipation because certain hormones are causing the muscles that push waste through your intestines to slow down.  You may develop hemorrhoids or swollen, bulging veins (varicose veins).  You may have back pain because of the weight gain and pregnancy hormones relaxing your joints between the bones in your pelvis and as a result of a shift in weight and the muscles that support your balance.  Your breasts will continue to grow and be tender.  Your gums may bleed and may be sensitive to brushing and flossing.  Dark spots or blotches (chloasma, mask of pregnancy) may develop on your face. This will likely fade after the baby is born.  A dark line from your belly button to the pubic area (linea nigra) may appear. This will likely  fade after the baby is born.  You may have changes in your hair. These can include thickening of your hair, rapid growth, and changes in texture. Some women also have hair loss during or after pregnancy, or hair that feels dry or thin. Your hair will most likely return to normal after your baby is born. WHAT TO EXPECT AT YOUR PRENATAL VISITS During a routine prenatal visit:  You will be weighed to make sure you and the fetus are growing normally.  Your blood pressure will be taken.  Your abdomen will be measured to track your baby's growth.  The fetal heartbeat will be listened to.  Any test results from the previous visit will be discussed. Your health care provider may ask you:  How you are feeling.  If you are feeling the baby move.  If you have had any abnormal symptoms, such as leaking fluid, bleeding, severe headaches, or abdominal cramping.  If you have any questions. Other tests that may be performed during your second trimester include:  Blood tests that check for:  Low iron levels (anemia).  Gestational diabetes (between 24 and 28 weeks).  Rh antibodies.  Urine tests to check for infections, diabetes, or protein in the urine.  An ultrasound to confirm the proper growth and development of the baby.  An amniocentesis to check for possible genetic problems.  Fetal screens for spina bifida and Down syndrome. HOME CARE INSTRUCTIONS   Avoid all smoking, herbs, alcohol, and unprescribed   drugs. These chemicals affect the formation and growth of the baby.  Follow your health care provider's instructions regarding medicine use. There are medicines that are either safe or unsafe to take during pregnancy.  Exercise only as directed by your health care provider. Experiencing uterine cramps is a good sign to stop exercising.  Continue to eat regular, healthy meals.  Wear a good support bra for breast tenderness.  Do not use hot tubs, steam rooms, or saunas.  Wear  your seat belt at all times when driving.  Avoid raw meat, uncooked cheese, cat litter boxes, and soil used by cats. These carry germs that can cause birth defects in the baby.  Take your prenatal vitamins.  Try taking a stool softener (if your health care provider approves) if you develop constipation. Eat more high-fiber foods, such as fresh vegetables or fruit and whole grains. Drink plenty of fluids to keep your urine clear or pale yellow.  Take warm sitz baths to soothe any pain or discomfort caused by hemorrhoids. Use hemorrhoid cream if your health care provider approves.  If you develop varicose veins, wear support hose. Elevate your feet for 15 minutes, 3-4 times a day. Limit salt in your diet.  Avoid heavy lifting, wear low heel shoes, and practice good posture.  Rest with your legs elevated if you have leg cramps or low back pain.  Visit your dentist if you have not gone yet during your pregnancy. Use a soft toothbrush to brush your teeth and be gentle when you floss.  A sexual relationship may be continued unless your health care provider directs you otherwise.  Continue to go to all your prenatal visits as directed by your health care provider. SEEK MEDICAL CARE IF:   You have dizziness.  You have mild pelvic cramps, pelvic pressure, or nagging pain in the abdominal area.  You have persistent nausea, vomiting, or diarrhea.  You have a bad smelling vaginal discharge.  You have pain with urination. SEEK IMMEDIATE MEDICAL CARE IF:   You have a fever.  You are leaking fluid from your vagina.  You have spotting or bleeding from your vagina.  You have severe abdominal cramping or pain.  You have rapid weight gain or loss.  You have shortness of breath with chest pain.  You notice sudden or extreme swelling of your face, hands, ankles, feet, or legs.  You have not felt your baby move in over an hour.  You have severe headaches that do not go away with  medicine.  You have vision changes. Document Released: 09/23/2001 Document Revised: 10/04/2013 Document Reviewed: 11/30/2012 [employer] Patient Information 2015 [employer]. This information is not intended to replace advice given to you by your health care provider. Make sure you discuss any questions you have with your health care provider.  Breastfeeding Deciding to breastfeed is one of the best choices you can make for you and your baby. A change in hormones during pregnancy causes your breast tissue to grow and increases the number and size of your milk ducts. These hormones also allow proteins, sugars, and fats from your blood supply to make breast milk in your milk-producing glands. Hormones prevent breast milk from being released before your baby is born as well as prompt milk flow after birth. Once breastfeeding has begun, thoughts of your baby, as well as his or her sucking or crying, can stimulate the release of milk from your milk-producing glands.  BENEFITS OF BREASTFEEDING For Your Baby  Your first   milk (colostrum) helps your baby's digestive system function better.   There are antibodies in your milk that help your baby fight off infections.   Your baby has a lower incidence of asthma, allergies, and sudden infant death syndrome.   The nutrients in breast milk are better for your baby than infant formulas and are designed uniquely for your baby's needs.   Breast milk improves your baby's brain development.   Your baby is less likely to develop other conditions, such as childhood obesity, asthma, or type 2 diabetes mellitus.  For You   Breastfeeding helps to create a very special bond between you and your baby.   Breastfeeding is convenient. Breast milk is always available at the correct temperature and costs nothing.   Breastfeeding helps to burn calories and helps you lose the weight gained during pregnancy.   Breastfeeding makes your uterus contract to its  prepregnancy size faster and slows bleeding (lochia) after you give birth.   Breastfeeding helps to lower your risk of developing type 2 diabetes mellitus, osteoporosis, and breast or ovarian cancer later in life. SIGNS THAT YOUR BABY IS HUNGRY Early Signs of Hunger  Increased alertness or activity.  Stretching.  Movement of the head from side to side.  Movement of the head and opening of the mouth when the corner of the mouth or cheek is stroked (rooting).  Increased sucking sounds, smacking lips, cooing, sighing, or squeaking.  Hand-to-mouth movements.  Increased sucking of fingers or hands. Late Signs of Hunger  Fussing.  Intermittent crying. Extreme Signs of Hunger Signs of extreme hunger will require calming and consoling before your baby will be able to breastfeed successfully. Do not wait for the following signs of extreme hunger to occur before you initiate breastfeeding:   Restlessness.  A loud, strong cry.   Screaming. BREASTFEEDING BASICS Breastfeeding Initiation  Find a comfortable place to sit or lie down, with your neck and back well supported.  Place a pillow or rolled up blanket under your baby to bring him or her to the level of your breast (if you are seated). Nursing pillows are specially designed to help support your arms and your baby while you breastfeed.  Make sure that your baby's abdomen is facing your abdomen.   Gently massage your breast. With your fingertips, massage from your chest wall toward your nipple in a circular motion. This encourages milk flow. You may need to continue this action during the feeding if your milk flows slowly.  Support your breast with 4 fingers underneath and your thumb above your nipple. Make sure your fingers are well away from your nipple and your baby's mouth.   Stroke your baby's lips gently with your finger or nipple.   When your baby's mouth is open wide enough, quickly bring your baby to your breast,  placing your entire nipple and as much of the colored area around your nipple (areola) as possible into your baby's mouth.   More areola should be visible above your baby's upper lip than below the lower lip.   Your baby's tongue should be between his or her lower gum and your breast.   Ensure that your baby's mouth is correctly positioned around your nipple (latched). Your baby's lips should create a seal on your breast and be turned out (everted).  It is common for your baby to suck about 2-3 minutes in order to start the flow of breast milk. Latching Teaching your baby how to latch on to your breast   properly is very important. An improper latch can cause nipple pain and decreased milk supply for you and poor weight gain in your baby. Also, if your baby is not latched onto your nipple properly, he or she may swallow some air during feeding. This can make your baby fussy. Burping your baby when you switch breasts during the feeding can help to get rid of the air. However, teaching your baby to latch on properly is still the best way to prevent fussiness from swallowing air while breastfeeding. Signs that your baby has successfully latched on to your nipple:    Silent tugging or silent sucking, without causing you pain.   Swallowing heard between every 3-4 sucks.    Muscle movement above and in front of his or her ears while sucking.  Signs that your baby has not successfully latched on to nipple:   Sucking sounds or smacking sounds from your baby while breastfeeding.  Nipple pain. If you think your baby has not latched on correctly, slip your finger into the corner of your baby's mouth to break the suction and place it between your baby's gums. Attempt breastfeeding initiation again. Signs of Successful Breastfeeding Signs from your baby:   A gradual decrease in the number of sucks or complete cessation of sucking.   Falling asleep.   Relaxation of his or her body.    Retention of a small amount of milk in his or her mouth.   Letting go of your breast by himself or herself. Signs from you:  Breasts that have increased in firmness, weight, and size 1-3 hours after feeding.   Breasts that are softer immediately after breastfeeding.  Increased milk volume, as well as a change in milk consistency and color by the fifth day of breastfeeding.   Nipples that are not sore, cracked, or bleeding. Signs That Your Baby is Getting Enough Milk  Wetting at least 3 diapers in a 24-hour period. The urine should be clear and pale yellow by age 5 days.  At least 3 stools in a 24-hour period by age 5 days. The stool should be soft and yellow.  At least 3 stools in a 24-hour period by age 7 days. The stool should be seedy and yellow.  No loss of weight greater than 10% of birth weight during the first 3 days of age.  Average weight gain of 4-7 ounces (113-198 g) per week after age 4 days.  Consistent daily weight gain by age 5 days, without weight loss after the age of 2 weeks. After a feeding, your baby may spit up a small amount. This is common. BREASTFEEDING FREQUENCY AND DURATION Frequent feeding will help you make more milk and can prevent sore nipples and breast engorgement. Breastfeed when you feel the need to reduce the fullness of your breasts or when your baby shows signs of hunger. This is called "breastfeeding on demand." Avoid introducing a pacifier to your baby while you are working to establish breastfeeding (the first 4-6 weeks after your baby is born). After this time you may choose to use a pacifier. Research has shown that pacifier use during the first year of a baby's life decreases the risk of sudden infant death syndrome (SIDS). Allow your baby to feed on each breast as long as he or she wants. Breastfeed until your baby is finished feeding. When your baby unlatches or falls asleep while feeding from the first breast, offer the second breast.  Because newborns are often sleepy in the   first few weeks of life, you may need to awaken your baby to get him or her to feed. Breastfeeding times will vary from baby to baby. However, the following rules can serve as a guide to help you ensure that your baby is properly fed:  Newborns (babies 4 weeks of age or younger) may breastfeed every 1-3 hours.  Newborns should not go longer than 3 hours during the day or 5 hours during the night without breastfeeding.  You should breastfeed your baby a minimum of 8 times in a 24-hour period until you begin to introduce solid foods to your baby at around 6 months of age. BREAST MILK PUMPING Pumping and storing breast milk allows you to ensure that your baby is exclusively fed your breast milk, even at times when you are unable to breastfeed. This is especially important if you are going back to work while you are still breastfeeding or when you are not able to be present during feedings. Your lactation consultant can give you guidelines on how long it is safe to store breast milk.  A breast pump is a machine that allows you to pump milk from your breast into a sterile bottle. The pumped breast milk can then be stored in a refrigerator or freezer. Some breast pumps are operated by hand, while others use electricity. Ask your lactation consultant which type will work best for you. Breast pumps can be purchased, but some hospitals and breastfeeding support groups lease breast pumps on a monthly basis. A lactation consultant can teach you how to hand express breast milk, if you prefer not to use a pump.  CARING FOR YOUR BREASTS WHILE YOU BREASTFEED Nipples can become dry, cracked, and sore while breastfeeding. The following recommendations can help keep your breasts moisturized and healthy:  Avoid using soap on your nipples.   Wear a supportive bra. Although not required, special nursing bras and tank tops are designed to allow access to your breasts for  breastfeeding without taking off your entire bra or top. Avoid wearing underwire-style bras or extremely tight bras.  Air dry your nipples for 3-4minutes after each feeding.   Use only cotton bra pads to absorb leaked breast milk. Leaking of breast milk between feedings is normal.   Use lanolin on your nipples after breastfeeding. Lanolin helps to maintain your skin's normal moisture barrier. If you use pure lanolin, you do not need to wash it off before feeding your baby again. Pure lanolin is not toxic to your baby. You may also hand express a few drops of breast milk and gently massage that milk into your nipples and allow the milk to air dry. In the first few weeks after giving birth, some women experience extremely full breasts (engorgement). Engorgement can make your breasts feel heavy, warm, and tender to the touch. Engorgement peaks within 3-5 days after you give birth. The following recommendations can help ease engorgement:  Completely empty your breasts while breastfeeding or pumping. You may want to start by applying warm, moist heat (in the shower or with warm water-soaked hand towels) just before feeding or pumping. This increases circulation and helps the milk flow. If your baby does not completely empty your breasts while breastfeeding, pump any extra milk after he or she is finished.  Wear a snug bra (nursing or regular) or tank top for 1-2 days to signal your body to slightly decrease milk production.  Apply ice packs to your breasts, unless this is too uncomfortable for you.    Make sure that your baby is latched on and positioned properly while breastfeeding. If engorgement persists after 48 hours of following these recommendations, contact your health care provider or a lactation consultant. OVERALL HEALTH CARE RECOMMENDATIONS WHILE BREASTFEEDING  Eat healthy foods. Alternate between meals and snacks, eating 3 of each per day. Because what you eat affects your breast milk,  some of the foods may make your baby more irritable than usual. Avoid eating these foods if you are sure that they are negatively affecting your baby.  Drink milk, fruit juice, and water to satisfy your thirst (about 10 glasses a day).   Rest often, relax, and continue to take your prenatal vitamins to prevent fatigue, stress, and anemia.  Continue breast self-awareness checks.  Avoid chewing and smoking tobacco.  Avoid alcohol and drug use. Some medicines that may be harmful to your baby can pass through breast milk. It is important to ask your health care provider before taking any medicine, including all over-the-counter and prescription medicine as well as vitamin and herbal supplements. It is possible to become pregnant while breastfeeding. If birth control is desired, ask your health care provider about options that will be safe for your baby. SEEK MEDICAL CARE IF:   You feel like you want to stop breastfeeding or have become frustrated with breastfeeding.  You have painful breasts or nipples.  Your nipples are cracked or bleeding.  Your breasts are red, tender, or warm.  You have a swollen area on either breast.  You have a fever or chills.  You have nausea or vomiting.  You have drainage other than breast milk from your nipples.  Your breasts do not become full before feedings by the fifth day after you give birth.  You feel sad and depressed.  Your baby is too sleepy to eat well.  Your baby is having trouble sleeping.   Your baby is wetting less than 3 diapers in a 24-hour period.  Your baby has less than 3 stools in a 24-hour period.  Your baby's skin or the white part of his or her eyes becomes yellow.   Your baby is not gaining weight by 5 days of age. SEEK IMMEDIATE MEDICAL CARE IF:   Your baby is overly tired (lethargic) and does not want to wake up and feed.  Your baby develops an unexplained fever. Document Released: 09/29/2005 Document Revised:  10/04/2013 Document Reviewed: 03/23/2013 [employer] Patient Information 2015 [employer]. This information is not intended to replace advice given to you by your health care provider. Make sure you discuss any questions you have with your health care provider.  

## 2015-04-23 ENCOUNTER — Ambulatory Visit (HOSPITAL_COMMUNITY)
Admission: RE | Admit: 2015-04-23 | Discharge: 2015-04-23 | Disposition: A | Discharge status: Home / Self Care | Payer: BLUE CROSS/BLUE SHIELD | Source: Ambulatory Visit | Attending: Family Medicine | Admitting: Family Medicine

## 2015-04-23 DIAGNOSIS — Z3482 Encounter for supervision of other normal pregnancy, second trimester: Secondary | ICD-10-CM | POA: Diagnosis not present

## 2015-04-23 DIAGNOSIS — O99212 Obesity complicating pregnancy, second trimester: Secondary | ICD-10-CM | POA: Insufficient documentation

## 2015-04-23 DIAGNOSIS — Z3A23 23 weeks gestation of pregnancy: Secondary | ICD-10-CM | POA: Insufficient documentation

## 2015-04-23 DIAGNOSIS — Z0489 Encounter for examination and observation for other specified reasons: Secondary | ICD-10-CM | POA: Insufficient documentation

## 2015-04-23 DIAGNOSIS — IMO0002 Reserved for concepts with insufficient information to code with codable children: Secondary | ICD-10-CM | POA: Insufficient documentation

## 2015-04-24 ENCOUNTER — Ambulatory Visit (INDEPENDENT_AMBULATORY_CARE_PROVIDER_SITE_OTHER): Payer: BLUE CROSS/BLUE SHIELD | Admitting: Family Medicine

## 2015-04-24 VITALS — BP 109/74 | HR 86 | Wt 304.0 lb

## 2015-04-24 DIAGNOSIS — O3421 Maternal care for scar from previous cesarean delivery: Secondary | ICD-10-CM

## 2015-04-24 DIAGNOSIS — O34219 Maternal care for unspecified type scar from previous cesarean delivery: Secondary | ICD-10-CM

## 2015-04-24 DIAGNOSIS — O26892 Other specified pregnancy related conditions, second trimester: Secondary | ICD-10-CM

## 2015-04-24 DIAGNOSIS — Z3482 Encounter for supervision of other normal pregnancy, second trimester: Secondary | ICD-10-CM

## 2015-04-24 DIAGNOSIS — N898 Other specified noninflammatory disorders of vagina: Secondary | ICD-10-CM

## 2015-04-24 MED ORDER — TERCONAZOLE 0.4 % VA CREA: 1.0000 | TOPICAL_CREAM | Freq: Every day | VAGINAL | Status: DC

## 2015-04-24 NOTE — Progress Notes (Signed)
Subjective:  Betty Shelton is a 29 y.o. G3P1011 at 3971w1d being seen today for ongoing prenatal care.  Patient reports vaginal irritation and vaginal discharge. Also reports itchy rectum following BM's.  Contractions: Not present.  Vag. Bleeding: None. Movement: Present. Denies leaking of fluid.   The following portions of the patient's history were reviewed and updated as appropriate: allergies, current medications, past family history, past medical history, past social history, past surgical history and problem list.   Objective:   Filed Vitals:   04/24/15 1317  BP: 109/74  Pulse: 86  Weight: 304 lb (137.893 kg)    Fetal Status: Fetal Heart Rate (bpm): 146   Movement: Present     General:  Alert, oriented and cooperative. Patient is in no acute distress.  Skin: Skin is warm and dry. No rash noted.   Cardiovascular: Normal heart rate noted  Respiratory: Normal respiratory effort, no problems with respiration noted  Abdomen: Soft, gravid, appropriate for gestational age. Pain/Pressure: Absent     Vaginal: Vag. Bleeding: None.    Vag D/C Character: Thin white clumps noted on vaginal walls  Extremities: Normal range of motion.  Edema: None  Mental Status: Normal mood and affect. Normal behavior. Normal judgment and thought content.   Urinalysis: Urine Protein: Negative Urine Glucose: Negative  Assessment and Plan:  Pregnancy: G3P1011 at 2971w1d  1. Encounter for supervision of other normal pregnancy in second trimester Continue routine prenatal care.   2. Previous cesarean delivery, antepartum For TOLAC for now  3. Vaginal discharge during pregnancy, second trimester  - Wet prep, genital - terconazole (TERAZOL 7) 0.4 % vaginal cream; Place 1 applicator vaginally at bedtime.  Dispense: 45 g; Refill: 0  Clean bottom well   Please refer to After Visit Summary for other counseling recommendations.   Return in 4 weeks (on 05/22/2015).   Reva Boresanya S Ajamu Maxon, MD

## 2015-04-24 NOTE — Patient Instructions (Addendum)
Breastfeeding Deciding to breastfeed is one of the best choices you can make for you and your baby. A change in hormones during pregnancy causes your breast tissue to grow and increases the number and size of your milk ducts. These hormones also allow proteins, sugars, and fats from your blood supply to make breast milk in your milk-producing glands. Hormones prevent breast milk from being released before your baby is born as well as prompt milk flow after birth. Once breastfeeding has begun, thoughts of your baby, as well as his or her sucking or crying, can stimulate the release of milk from your milk-producing glands.  BENEFITS OF BREASTFEEDING For Your Baby  Your first milk (colostrum) helps your baby's digestive system function better.   There are antibodies in your milk that help your baby fight off infections.   Your baby has a lower incidence of asthma, allergies, and sudden infant death syndrome.   The nutrients in breast milk are better for your baby than infant formulas and are designed uniquely for your baby's needs.   Breast milk improves your baby's brain development.   Your baby is less likely to develop other conditions, such as childhood obesity, asthma, or type 2 diabetes mellitus.  For You   Breastfeeding helps to create a very special bond between you and your baby.   Breastfeeding is convenient. Breast milk is always available at the correct temperature and costs nothing.   Breastfeeding helps to burn calories and helps you lose the weight gained during pregnancy.   Breastfeeding makes your uterus contract to its prepregnancy size faster and slows bleeding (lochia) after you give birth.   Breastfeeding helps to lower your risk of developing type 2 diabetes mellitus, osteoporosis, and breast or ovarian cancer later in life. SIGNS THAT YOUR BABY IS HUNGRY Early Signs of Hunger  Increased alertness or activity.  Stretching.  Movement of the head from  side to side.  Movement of the head and opening of the mouth when the corner of the mouth or cheek is stroked (rooting).  Increased sucking sounds, smacking lips, cooing, sighing, or squeaking.  Hand-to-mouth movements.  Increased sucking of fingers or hands. Late Signs of Hunger  Fussing.  Intermittent crying. Extreme Signs of Hunger Signs of extreme hunger will require calming and consoling before your baby will be able to breastfeed successfully. Do not wait for the following signs of extreme hunger to occur before you initiate breastfeeding:   Restlessness.  A loud, strong cry.   Screaming. BREASTFEEDING BASICS Breastfeeding Initiation  Find a comfortable place to sit or lie down, with your neck and back well supported.  Place a pillow or rolled up blanket under your baby to bring him or her to the level of your breast (if you are seated). Nursing pillows are specially designed to help support your arms and your baby while you breastfeed.  Make sure that your baby's abdomen is facing your abdomen.   Gently massage your breast. With your fingertips, massage from your chest wall toward your nipple in a circular motion. This encourages milk flow. You may need to continue this action during the feeding if your milk flows slowly.  Support your breast with 4 fingers underneath and your thumb above your nipple. Make sure your fingers are well away from your nipple and your baby's mouth.   Stroke your baby's lips gently with your finger or nipple.   When your baby's mouth is open wide enough, quickly bring your baby to your   breast, placing your entire nipple and as much of the colored area around your nipple (areola) as possible into your baby's mouth.   More areola should be visible above your baby's upper lip than below the lower lip.   Your baby's tongue should be between his or her lower gum and your breast.   Ensure that your baby's mouth is correctly positioned  around your nipple (latched). Your baby's lips should create a seal on your breast and be turned out (everted).  It is common for your baby to suck about 2-3 minutes in order to start the flow of breast milk. Latching Teaching your baby how to latch on to your breast properly is very important. An improper latch can cause nipple pain and decreased milk supply for you and poor weight gain in your baby. Also, if your baby is not latched onto your nipple properly, he or she may swallow some air during feeding. This can make your baby fussy. Burping your baby when you switch breasts during the feeding can help to get rid of the air. However, teaching your baby to latch on properly is still the best way to prevent fussiness from swallowing air while breastfeeding. Signs that your baby has successfully latched on to your nipple:    Silent tugging or silent sucking, without causing you pain.   Swallowing heard between every 3-4 sucks.    Muscle movement above and in front of his or her ears while sucking.  Signs that your baby has not successfully latched on to nipple:   Sucking sounds or smacking sounds from your baby while breastfeeding.  Nipple pain. If you think your baby has not latched on correctly, slip your finger into the corner of your baby's mouth to break the suction and place it between your baby's gums. Attempt breastfeeding initiation again. Signs of Successful Breastfeeding Signs from your baby:   A gradual decrease in the number of sucks or complete cessation of sucking.   Falling asleep.   Relaxation of his or her body.   Retention of a small amount of milk in his or her mouth.   Letting go of your breast by himself or herself. Signs from you:  Breasts that have increased in firmness, weight, and size 1-3 hours after feeding.   Breasts that are softer immediately after breastfeeding.  Increased milk volume, as well as a change in milk consistency and color by  the fifth day of breastfeeding.   Nipples that are not sore, cracked, or bleeding. Signs That Your Baby is Getting Enough Milk  Wetting at least 3 diapers in a 24-hour period. The urine should be clear and pale yellow by age 5 days.  At least 3 stools in a 24-hour period by age 5 days. The stool should be soft and yellow.  At least 3 stools in a 24-hour period by age 7 days. The stool should be seedy and yellow.  No loss of weight greater than 10% of birth weight during the first 3 days of age.  Average weight gain of 4-7 ounces (113-198 g) per week after age 4 days.  Consistent daily weight gain by age 5 days, without weight loss after the age of 2 weeks. After a feeding, your baby may spit up a small amount. This is common. BREASTFEEDING FREQUENCY AND DURATION Frequent feeding will help you make more milk and can prevent sore nipples and breast engorgement. Breastfeed when you feel the need to reduce the fullness of your breasts   or when your baby shows signs of hunger. This is called "breastfeeding on demand." Avoid introducing a pacifier to your baby while you are working to establish breastfeeding (the first 4-6 weeks after your baby is born). After this time you may choose to use a pacifier. Research has shown that pacifier use during the first year of a baby's life decreases the risk of sudden infant death syndrome (SIDS). Allow your baby to feed on each breast as long as he or she wants. Breastfeed until your baby is finished feeding. When your baby unlatches or falls asleep while feeding from the first breast, offer the second breast. Because newborns are often sleepy in the first few weeks of life, you may need to awaken your baby to get him or her to feed. Breastfeeding times will vary from baby to baby. However, the following rules can serve as a guide to help you ensure that your baby is properly fed:  Newborns (babies 4 weeks of age or younger) may breastfeed every 1-3  hours.  Newborns should not go longer than 3 hours during the day or 5 hours during the night without breastfeeding.  You should breastfeed your baby a minimum of 8 times in a 24-hour period until you begin to introduce solid foods to your baby at around 6 months of age. BREAST MILK PUMPING Pumping and storing breast milk allows you to ensure that your baby is exclusively fed your breast milk, even at times when you are unable to breastfeed. This is especially important if you are going back to work while you are still breastfeeding or when you are not able to be present during feedings. Your lactation consultant can give you guidelines on how long it is safe to store breast milk.  A breast pump is a machine that allows you to pump milk from your breast into a sterile bottle. The pumped breast milk can then be stored in a refrigerator or freezer. Some breast pumps are operated by hand, while others use electricity. Ask your lactation consultant which type will work best for you. Breast pumps can be purchased, but some hospitals and breastfeeding support groups lease breast pumps on a monthly basis. A lactation consultant can teach you how to hand express breast milk, if you prefer not to use a pump.  CARING FOR YOUR BREASTS WHILE YOU BREASTFEED Nipples can become dry, cracked, and sore while breastfeeding. The following recommendations can help keep your breasts moisturized and healthy:  Avoid using soap on your nipples.   Wear a supportive bra. Although not required, special nursing bras and tank tops are designed to allow access to your breasts for breastfeeding without taking off your entire bra or top. Avoid wearing underwire-style bras or extremely tight bras.  Air dry your nipples for 3-4minutes after each feeding.   Use only cotton bra pads to absorb leaked breast milk. Leaking of breast milk between feedings is normal.   Use lanolin on your nipples after breastfeeding. Lanolin helps to  maintain your skin's normal moisture barrier. If you use pure lanolin, you do not need to wash it off before feeding your baby again. Pure lanolin is not toxic to your baby. You may also hand express a few drops of breast milk and gently massage that milk into your nipples and allow the milk to air dry. In the first few weeks after giving birth, some women experience extremely full breasts (engorgement). Engorgement can make your breasts feel heavy, warm, and tender to the   touch. Engorgement peaks within 3-5 days after you give birth. The following recommendations can help ease engorgement:  Completely empty your breasts while breastfeeding or pumping. You may want to start by applying warm, moist heat (in the shower or with warm water-soaked hand towels) just before feeding or pumping. This increases circulation and helps the milk flow. If your baby does not completely empty your breasts while breastfeeding, pump any extra milk after he or she is finished.  Wear a snug bra (nursing or regular) or tank top for 1-2 days to signal your body to slightly decrease milk production.  Apply ice packs to your breasts, unless this is too uncomfortable for you.  Make sure that your baby is latched on and positioned properly while breastfeeding. If engorgement persists after 48 hours of following these recommendations, contact your health care provider or a lactation consultant. OVERALL HEALTH CARE RECOMMENDATIONS WHILE BREASTFEEDING  Eat healthy foods. Alternate between meals and snacks, eating 3 of each per day. Because what you eat affects your breast milk, some of the foods may make your baby more irritable than usual. Avoid eating these foods if you are sure that they are negatively affecting your baby.  Drink milk, fruit juice, and water to satisfy your thirst (about 10 glasses a day).   Rest often, relax, and continue to take your prenatal vitamins to prevent fatigue, stress, and anemia.  Continue  breast self-awareness checks.  Avoid chewing and smoking tobacco.  Avoid alcohol and drug use. Some medicines that may be harmful to your baby can pass through breast milk. It is important to ask your health care provider before taking any medicine, including all over-the-counter and prescription medicine as well as vitamin and herbal supplements. It is possible to become pregnant while breastfeeding. If birth control is desired, ask your health care provider about options that will be safe for your baby. SEEK MEDICAL CARE IF:   You feel like you want to stop breastfeeding or have become frustrated with breastfeeding.  You have painful breasts or nipples.  Your nipples are cracked or bleeding.  Your breasts are red, tender, or warm.  You have a swollen area on either breast.  You have a fever or chills.  You have nausea or vomiting.  You have drainage other than breast milk from your nipples.  Your breasts do not become full before feedings by the fifth day after you give birth.  You feel sad and depressed.  Your baby is too sleepy to eat well.  Your baby is having trouble sleeping.   Your baby is wetting less than 3 diapers in a 24-hour period.  Your baby has less than 3 stools in a 24-hour period.  Your baby's skin or the white part of his or her eyes becomes yellow.   Your baby is not gaining weight by 5 days of age. SEEK IMMEDIATE MEDICAL CARE IF:   Your baby is overly tired (lethargic) and does not want to wake up and feed.  Your baby develops an unexplained fever. Document Released: 09/29/2005 Document Revised: 10/04/2013 Document Reviewed: 03/23/2013 [employer] Patient Information 2015 [employer]. This information is not intended to replace advice given to you by your health care provider. Make sure you discuss any questions you have with your health care provider.  

## 2015-04-25 ENCOUNTER — Telehealth: Payer: Self-pay | Admitting: *Deleted

## 2015-04-25 DIAGNOSIS — B9689 Other specified bacterial agents as the cause of diseases classified elsewhere: Secondary | ICD-10-CM

## 2015-04-25 DIAGNOSIS — N760 Acute vaginitis: Principal | ICD-10-CM

## 2015-04-25 LAB — WET PREP, GENITAL
TRICH WET PREP: NONE SEEN
Yeast Wet Prep HPF POC: NONE SEEN

## 2015-04-25 MED ORDER — METRONIDAZOLE 500 MG PO TABS: 500.0000 mg | ORAL_TABLET | Freq: Two times a day (BID) | ORAL | Status: DC

## 2015-04-25 NOTE — Telephone Encounter (Signed)
I have sent in rx to pharmacy.  Patient aware.

## 2015-04-25 NOTE — Telephone Encounter (Signed)
-----   Message from Reva Boresanya S Pratt, MD sent at 04/25/2015  6:57 AM EDT ----- Pt. With BV--i think i treated her for yeast--please also call in Flagyl 500 mg po bid x 7 d

## 2015-05-21 ENCOUNTER — Encounter: Payer: Self-pay | Admitting: Obstetrics & Gynecology

## 2015-05-21 ENCOUNTER — Ambulatory Visit (INDEPENDENT_AMBULATORY_CARE_PROVIDER_SITE_OTHER): Payer: BLUE CROSS/BLUE SHIELD | Admitting: Obstetrics & Gynecology

## 2015-05-21 VITALS — BP 135/80 | HR 79 | Wt 303.0 lb

## 2015-05-21 DIAGNOSIS — Z23 Encounter for immunization: Secondary | ICD-10-CM

## 2015-05-21 DIAGNOSIS — Z36 Encounter for antenatal screening of mother: Secondary | ICD-10-CM | POA: Diagnosis not present

## 2015-05-21 DIAGNOSIS — Z3492 Encounter for supervision of normal pregnancy, unspecified, second trimester: Secondary | ICD-10-CM

## 2015-05-21 DIAGNOSIS — Z3482 Encounter for supervision of other normal pregnancy, second trimester: Secondary | ICD-10-CM

## 2015-05-21 LAB — CBC
HCT: 33.6 % — ABNORMAL LOW (ref 36.0–46.0)
HEMOGLOBIN: 11.6 g/dL — AB (ref 12.0–15.0)
MCH: 29.4 pg (ref 26.0–34.0)
MCHC: 34.5 g/dL (ref 30.0–36.0)
MCV: 85.1 fL (ref 78.0–100.0)
MPV: 10.1 fL (ref 8.6–12.4)
Platelets: 256 10*3/uL (ref 150–400)
RBC: 3.95 MIL/uL (ref 3.87–5.11)
RDW: 13.3 % (ref 11.5–15.5)
WBC: 8.1 10*3/uL (ref 4.0–10.5)

## 2015-05-21 NOTE — Patient Instructions (Signed)
Third Trimester of Pregnancy The third trimester is from week 29 through week 42, months 7 through 9. The third trimester is a time when the fetus is growing rapidly. At the end of the ninth month, the fetus is about 20 inches in length and weighs 6-10 pounds.  BODY CHANGES Your body goes through many changes during pregnancy. The changes vary from woman to woman.   Your weight will continue to increase. You can expect to gain 25-35 pounds (11-16 kg) by the end of the pregnancy.  You may begin to get stretch marks on your hips, abdomen, and breasts.  You may urinate more often because the fetus is moving lower into your pelvis and pressing on your bladder.  You may develop or continue to have heartburn as a result of your pregnancy.  You may develop constipation because certain hormones are causing the muscles that push waste through your intestines to slow down.  You may develop hemorrhoids or swollen, bulging veins (varicose veins).  You may have pelvic pain because of the weight gain and pregnancy hormones relaxing your joints between the bones in your pelvis. Backaches may result from overexertion of the muscles supporting your posture.  You may have changes in your hair. These can include thickening of your hair, rapid growth, and changes in texture. Some women also have hair loss during or after pregnancy, or hair that feels dry or thin. Your hair will most likely return to normal after your baby is born.  Your breasts will continue to grow and be tender. A yellow discharge may leak from your breasts called colostrum.  Your belly button may stick out.  You may feel short of breath because of your expanding uterus.  You may notice the fetus "dropping," or moving lower in your abdomen.  You may have a bloody mucus discharge. This usually occurs a few days to a week before labor begins.  Your cervix becomes thin and soft (effaced) near your due date. WHAT TO EXPECT AT YOUR PRENATAL  EXAMS  You will have prenatal exams every 2 weeks until week 36. Then, you will have weekly prenatal exams. During a routine prenatal visit:  You will be weighed to make sure you and the fetus are growing normally.  Your blood pressure is taken.  Your abdomen will be measured to track your baby's growth.  The fetal heartbeat will be listened to.  Any test results from the previous visit will be discussed.  You may have a cervical check near your due date to see if you have effaced. At around 36 weeks, your caregiver will check your cervix. At the same time, your caregiver will also perform a test on the secretions of the vaginal tissue. This test is to determine if a type of bacteria, Group B streptococcus, is present. Your caregiver will explain this further. Your caregiver may ask you:  What your birth plan is.  How you are feeling.  If you are feeling the baby move.  If you have had any abnormal symptoms, such as leaking fluid, bleeding, severe headaches, or abdominal cramping.  If you have any questions. Other tests or screenings that may be performed during your third trimester include:  Blood tests that check for low iron levels (anemia).  Fetal testing to check the health, activity level, and growth of the fetus. Testing is done if you have certain medical conditions or if there are problems during the pregnancy. FALSE LABOR You may feel small, irregular contractions that   eventually go away. These are called Braxton Hicks contractions, or false labor. Contractions may last for hours, days, or even weeks before true labor sets in. If contractions come at regular intervals, intensify, or become painful, it is best to be seen by your caregiver.  SIGNS OF LABOR   Menstrual-like cramps.  Contractions that are 5 minutes apart or less.  Contractions that start on the top of the uterus and spread down to the lower abdomen and back.  A sense of increased pelvic pressure or back  pain.  A watery or bloody mucus discharge that comes from the vagina. If you have any of these signs before the 37th week of pregnancy, call your caregiver right away. You need to go to the hospital to get checked immediately. HOME CARE INSTRUCTIONS   Avoid all smoking, herbs, alcohol, and unprescribed drugs. These chemicals affect the formation and growth of the baby.  Follow your caregiver's instructions regarding medicine use. There are medicines that are either safe or unsafe to take during pregnancy.  Exercise only as directed by your caregiver. Experiencing uterine cramps is a good sign to stop exercising.  Continue to eat regular, healthy meals.  Wear a good support bra for breast tenderness.  Do not use hot tubs, steam rooms, or saunas.  Wear your seat belt at all times when driving.  Avoid raw meat, uncooked cheese, cat litter boxes, and soil used by cats. These carry germs that can cause birth defects in the baby.  Take your prenatal vitamins.  Try taking a stool softener (if your caregiver approves) if you develop constipation. Eat more high-fiber foods, such as fresh vegetables or fruit and whole grains. Drink plenty of fluids to keep your urine clear or pale yellow.  Take warm sitz baths to soothe any pain or discomfort caused by hemorrhoids. Use hemorrhoid cream if your caregiver approves.  If you develop varicose veins, wear support hose. Elevate your feet for 15 minutes, 3-4 times a day. Limit salt in your diet.  Avoid heavy lifting, wear low heal shoes, and practice good posture.  Rest a lot with your legs elevated if you have leg cramps or low back pain.  Visit your dentist if you have not gone during your pregnancy. Use a soft toothbrush to brush your teeth and be gentle when you floss.  A sexual relationship may be continued unless your caregiver directs you otherwise.  Do not travel far distances unless it is absolutely necessary and only with the approval  of your caregiver.  Take prenatal classes to understand, practice, and ask questions about the labor and delivery.  Make a trial run to the hospital.  Pack your hospital bag.  Prepare the baby's nursery.  Continue to go to all your prenatal visits as directed by your caregiver. SEEK MEDICAL CARE IF:  You are unsure if you are in labor or if your water has broken.  You have dizziness.  You have mild pelvic cramps, pelvic pressure, or nagging pain in your abdominal area.  You have persistent nausea, vomiting, or diarrhea.  You have a bad smelling vaginal discharge.  You have pain with urination. SEEK IMMEDIATE MEDICAL CARE IF:   You have a fever.  You are leaking fluid from your vagina.  You have spotting or bleeding from your vagina.  You have severe abdominal cramping or pain.  You have rapid weight loss or gain.  You have shortness of breath with chest pain.  You notice sudden or extreme swelling   of your face, hands, ankles, feet, or legs.  You have not felt your baby move in over an hour.  You have severe headaches that do not go away with medicine.  You have vision changes. Document Released: 09/23/2001 Document Revised: 10/04/2013 Document Reviewed: 11/30/2012 [employer] Patient Information 2015 [employer]. This information is not intended to replace advice given to you by your health care provider. Make sure you discuss any questions you have with your health care provider.  

## 2015-05-21 NOTE — Addendum Note (Signed)
Addended by: Willodean Rosenthal on: 05/21/2015 05:03 PM   Modules accepted: Orders

## 2015-05-21 NOTE — Progress Notes (Signed)
Pt with no complaints.  She had questions on waterbirth after prev c-section. She is interested in a TOLAC and has signed her consent. 28 week labs today: RPR, 1 hour GTT and HIV today  Ricka Westra L. Harraway-Smith, M.D., [facility]

## 2015-05-22 LAB — HIV ANTIBODY (ROUTINE TESTING W REFLEX): HIV 1&2 Ab, 4th Generation: NONREACTIVE

## 2015-05-22 LAB — RPR

## 2015-05-22 LAB — GLUCOSE TOLERANCE, 1 HOUR (50G) W/O FASTING: GLUCOSE 1 HOUR GTT: 108 mg/dL (ref 70–140)

## 2015-06-04 ENCOUNTER — Other Ambulatory Visit: Payer: Self-pay | Admitting: Obstetrics & Gynecology

## 2015-06-04 ENCOUNTER — Ambulatory Visit (HOSPITAL_COMMUNITY)
Admission: RE | Admit: 2015-06-04 | Discharge: 2015-06-04 | Disposition: A | Discharge status: Home / Self Care | Payer: BLUE CROSS/BLUE SHIELD | Source: Ambulatory Visit | Attending: Obstetrics & Gynecology | Admitting: Obstetrics & Gynecology

## 2015-06-04 DIAGNOSIS — O09293 Supervision of pregnancy with other poor reproductive or obstetric history, third trimester: Secondary | ICD-10-CM | POA: Insufficient documentation

## 2015-06-04 DIAGNOSIS — O34219 Maternal care for unspecified type scar from previous cesarean delivery: Secondary | ICD-10-CM

## 2015-06-04 DIAGNOSIS — O99213 Obesity complicating pregnancy, third trimester: Secondary | ICD-10-CM | POA: Insufficient documentation

## 2015-06-04 DIAGNOSIS — Z3A29 29 weeks gestation of pregnancy: Secondary | ICD-10-CM

## 2015-06-04 DIAGNOSIS — IMO0002 Reserved for concepts with insufficient information to code with codable children: Secondary | ICD-10-CM

## 2015-06-04 DIAGNOSIS — Z0489 Encounter for examination and observation for other specified reasons: Secondary | ICD-10-CM

## 2015-06-04 DIAGNOSIS — Z3492 Encounter for supervision of normal pregnancy, unspecified, second trimester: Secondary | ICD-10-CM

## 2015-06-04 DIAGNOSIS — E669 Obesity, unspecified: Secondary | ICD-10-CM | POA: Insufficient documentation

## 2015-06-04 DIAGNOSIS — O3421 Maternal care for scar from previous cesarean delivery: Secondary | ICD-10-CM | POA: Diagnosis not present

## 2015-06-05 ENCOUNTER — Encounter: Payer: Self-pay | Admitting: *Deleted

## 2015-06-05 ENCOUNTER — Ambulatory Visit (INDEPENDENT_AMBULATORY_CARE_PROVIDER_SITE_OTHER): Payer: BLUE CROSS/BLUE SHIELD | Admitting: Obstetrics & Gynecology

## 2015-06-05 VITALS — BP 112/75 | HR 77 | Wt 299.0 lb

## 2015-06-05 DIAGNOSIS — Z3483 Encounter for supervision of other normal pregnancy, third trimester: Secondary | ICD-10-CM

## 2015-06-05 DIAGNOSIS — E669 Obesity, unspecified: Secondary | ICD-10-CM

## 2015-06-05 DIAGNOSIS — O99213 Obesity complicating pregnancy, third trimester: Secondary | ICD-10-CM

## 2015-06-05 DIAGNOSIS — O34219 Maternal care for unspecified type scar from previous cesarean delivery: Secondary | ICD-10-CM

## 2015-06-05 DIAGNOSIS — O3421 Maternal care for scar from previous cesarean delivery: Secondary | ICD-10-CM

## 2015-06-05 NOTE — Progress Notes (Signed)
Subjective:  Betty Shelton is a 29 y.o. G3P1011 at [redacted]w[redacted]d being seen today for ongoing prenatal care.  Patient reports no complaints.   .   .  . Denies leaking of fluid. She says that she eats a much healthier diet when she is pregnant. In fact, she lost 40 pounds with her last pregnancy. I explained that ACOG recommended at least a 12 pound weight gain.  The following portions of the patient's history were reviewed and updated as appropriate: allergies, current medications, past family history, past medical history, past social history, past surgical history and problem list.   Objective:  There were no vitals filed for this visit.  Fetal Status:           General:  Alert, oriented and cooperative. Patient is in no acute distress.  Skin: Skin is warm and dry. No rash noted.   Cardiovascular: Normal heart rate noted  Respiratory: Normal respiratory effort, no problems with respiration noted  Abdomen: Soft, gravid, appropriate for gestational age.       Pelvic:       Cervical exam deferred        Extremities: Normal range of motion.     Mental Status: Normal mood and affect. Normal behavior. Normal judgment and thought content.   Urinalysis:      Assessment and Plan:  Pregnancy: G3P1011 at [redacted]w[redacted]d  1. Encounter for supervision of other normal pregnancy in third trimester   2. Obesity in pregnancy, antepartum, third trimester   3. Previous cesarean delivery, antepartum   Preterm labor symptoms and general obstetric precautions including but not limited to vaginal bleeding, contractions, leaking of fluid and fetal movement were reviewed in detail with the patient.  Please refer to After Visit Summary for other counseling recommendations.  Return in about 2 weeks (around 06/19/2015).   Allie Bossier, MD

## 2015-06-11 ENCOUNTER — Telehealth: Payer: Self-pay | Admitting: *Deleted

## 2015-06-11 DIAGNOSIS — O99213 Obesity complicating pregnancy, third trimester: Secondary | ICD-10-CM

## 2015-06-11 NOTE — Telephone Encounter (Signed)
-----   Message from Willodean Rosenthal, MD sent at 06/08/2015 11:46 AM EDT ----- Please schedule f/u sono in 6 weeks for growth.  Thx, clh-S

## 2015-06-11 NOTE — Telephone Encounter (Signed)
Edited [place] order for follow-up US for growth in 6 weeks

## 2015-06-11 NOTE — Telephone Encounter (Signed)
Had to edit order for follow-up US for growth in 6 weeks

## 2015-06-11 NOTE — Telephone Encounter (Signed)
Spoke to pt about [place] and recommendation to f/u in 6 wks for growth scan, pt scheduled [place] appt

## 2015-06-20 ENCOUNTER — Ambulatory Visit (INDEPENDENT_AMBULATORY_CARE_PROVIDER_SITE_OTHER): Payer: BLUE CROSS/BLUE SHIELD | Admitting: Obstetrics and Gynecology

## 2015-06-20 VITALS — BP 114/71 | HR 68 | Wt 300.0 lb

## 2015-06-20 DIAGNOSIS — Z3493 Encounter for supervision of normal pregnancy, unspecified, third trimester: Secondary | ICD-10-CM

## 2015-06-20 DIAGNOSIS — Z23 Encounter for immunization: Secondary | ICD-10-CM | POA: Diagnosis not present

## 2015-06-20 DIAGNOSIS — Z3483 Encounter for supervision of other normal pregnancy, third trimester: Secondary | ICD-10-CM

## 2015-06-22 ENCOUNTER — Encounter: Payer: Self-pay | Admitting: Obstetrics and Gynecology

## 2015-06-22 NOTE — Progress Notes (Signed)
Subjective:  Betty Shelton is a 29 y.o. G3P1011 at [redacted]w[redacted]d being seen today for ongoing prenatal care.  Patient reports no complaints.  Contractions: Not present.  Vag. Bleeding: None. Movement: Present. Denies leaking of fluid.   The following portions of the patient's history were reviewed and updated as appropriate: allergies, current medications, past family history, past medical history, past social history, past surgical history and problem list.   Objective:   Filed Vitals:   06/20/15 1320  BP: 114/71  Pulse: 68  Weight: 136.079 kg (300 lb)    Fetal Status: Fetal Heart Rate (bpm): 135   Movement: Present     General:  Alert, oriented and cooperative. Patient is in no acute distress.  Skin: Skin is warm and dry. No rash noted.   Cardiovascular: Normal heart rate noted  Respiratory: Normal respiratory effort, no problems with respiration noted  Abdomen: Soft, gravid, appropriate for gestational age. Pain/Pressure: Absent     Pelvic: Vag. Bleeding: None Vag D/C Character: Thin   Cervical exam deferred        Extremities: Normal range of motion.  Edema: None  Mental Status: Normal mood and affect. Normal behavior. Normal judgment and thought content.   Urinalysis: Urine Protein: Negative Urine Glucose: Negative  Assessment and Plan:  Pregnancy: G3P1011 at [redacted]w[redacted]d  1. Supervision of normal pregnancy in third trimester Doing well - Flu Vaccine QUAD 36+ mos IM (Fluarix, Quad PF)  2. Encounter for supervision of other normal pregnancy in third trimester Weight stable  Preterm labor symptoms and general obstetric precautions including but not limited to vaginal bleeding, contractions, leaking of fluid and fetal movement were reviewed in detail with the patient. Please refer to After Visit Summary for other counseling recommendations.  Return in about 2 weeks (around 07/04/2015).   Danae Orleans, CNM

## 2015-07-01 ENCOUNTER — Encounter (HOSPITAL_COMMUNITY): Payer: Self-pay

## 2015-07-01 ENCOUNTER — Inpatient Hospital Stay (HOSPITAL_COMMUNITY)
Admission: AD | Admit: 2015-07-01 | Discharge: 2015-07-01 | Discharge status: Against Medical Advice | Payer: BLUE CROSS/BLUE SHIELD | Source: Ambulatory Visit | Attending: Family Medicine | Admitting: Family Medicine

## 2015-07-01 DIAGNOSIS — O99333 Smoking (tobacco) complicating pregnancy, third trimester: Secondary | ICD-10-CM | POA: Diagnosis not present

## 2015-07-01 DIAGNOSIS — F17210 Nicotine dependence, cigarettes, uncomplicated: Secondary | ICD-10-CM | POA: Diagnosis not present

## 2015-07-01 DIAGNOSIS — O26893 Other specified pregnancy related conditions, third trimester: Secondary | ICD-10-CM | POA: Diagnosis present

## 2015-07-01 DIAGNOSIS — O99213 Obesity complicating pregnancy, third trimester: Secondary | ICD-10-CM | POA: Insufficient documentation

## 2015-07-01 DIAGNOSIS — O34219 Maternal care for unspecified type scar from previous cesarean delivery: Secondary | ICD-10-CM

## 2015-07-01 DIAGNOSIS — Z3A32 32 weeks gestation of pregnancy: Secondary | ICD-10-CM | POA: Diagnosis not present

## 2015-07-01 DIAGNOSIS — S5011XA Contusion of right forearm, initial encounter: Secondary | ICD-10-CM | POA: Insufficient documentation

## 2015-07-01 DIAGNOSIS — Z6841 Body Mass Index (BMI) 40.0 and over, adult: Secondary | ICD-10-CM | POA: Diagnosis not present

## 2015-07-01 HISTORY — DX: Other specified health status: Z78.9

## 2015-07-01 LAB — URINE MICROSCOPIC-ADD ON

## 2015-07-01 LAB — URINALYSIS, ROUTINE W REFLEX MICROSCOPIC
Bilirubin Urine: NEGATIVE
Glucose, UA: NEGATIVE mg/dL
Ketones, ur: 40 mg/dL — AB
Nitrite: NEGATIVE
Specific Gravity, Urine: >1.03 — ABNORMAL HIGH (ref 1.005–1.030)
UROBILINOGEN UA: 1 mg/dL (ref 0.0–1.0)
pH: 6 (ref 5.0–8.0)
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
## 2015-07-03 ENCOUNTER — Ambulatory Visit (INDEPENDENT_AMBULATORY_CARE_PROVIDER_SITE_OTHER): Payer: BLUE CROSS/BLUE SHIELD | Admitting: Family Medicine

## 2015-07-03 ENCOUNTER — Encounter: Payer: Self-pay | Admitting: Family Medicine

## 2015-07-03 VITALS — BP 117/75 | HR 84 | Wt 297.0 lb

## 2015-07-03 DIAGNOSIS — R309 Painful micturition, unspecified: Secondary | ICD-10-CM | POA: Diagnosis not present

## 2015-07-03 DIAGNOSIS — O34219 Maternal care for unspecified type scar from previous cesarean delivery: Secondary | ICD-10-CM

## 2015-07-03 DIAGNOSIS — O26893 Other specified pregnancy related conditions, third trimester: Secondary | ICD-10-CM

## 2015-07-03 DIAGNOSIS — N898 Other specified noninflammatory disorders of vagina: Secondary | ICD-10-CM | POA: Diagnosis not present

## 2015-07-03 DIAGNOSIS — Z3483 Encounter for supervision of other normal pregnancy, third trimester: Secondary | ICD-10-CM

## 2015-07-03 DIAGNOSIS — O3421 Maternal care for scar from previous cesarean delivery: Secondary | ICD-10-CM

## 2015-07-03 DIAGNOSIS — R300 Dysuria: Secondary | ICD-10-CM

## 2015-07-03 LAB — POCT URINALYSIS DIPSTICK
BILIRUBIN UA: NEGATIVE
Blood, UA: NEGATIVE
Glucose, UA: NEGATIVE
Ketones, UA: 40
NITRITE UA: NEGATIVE
Protein, UA: 30
Spec Grav, UA: 1.025
UROBILINOGEN UA: 1
pH, UA: 5

## 2015-07-03 MED ORDER — TERCONAZOLE 0.4 % VA CREA: 1.0000 | TOPICAL_CREAM | Freq: Every day | VAGINAL | Status: DC

## 2015-07-03 NOTE — Patient Instructions (Signed)
Third Trimester of Pregnancy The third trimester is from week 29 through week 42, months 7 through 9. The third trimester is a time when the fetus is growing rapidly. At the end of the ninth month, the fetus is about 20 inches in length and weighs 6-10 pounds.  BODY CHANGES Your body goes through many changes during pregnancy. The changes vary from woman to woman.   Your weight will continue to increase. You can expect to gain 25-35 pounds (11-16 kg) by the end of the pregnancy.  You may begin to get stretch marks on your hips, abdomen, and breasts.  You may urinate more often because the fetus is moving lower into your pelvis and pressing on your bladder.  You may develop or continue to have heartburn as a result of your pregnancy.  You may develop constipation because certain hormones are causing the muscles that push waste through your intestines to slow down.  You may develop hemorrhoids or swollen, bulging veins (varicose veins).  You may have pelvic pain because of the weight gain and pregnancy hormones relaxing your joints between the bones in your pelvis. Backaches may result from overexertion of the muscles supporting your posture.  You may have changes in your hair. These can include thickening of your hair, rapid growth, and changes in texture. Some women also have hair loss during or after pregnancy, or hair that feels dry or thin. Your hair will most likely return to normal after your baby is born.  Your breasts will continue to grow and be tender. A yellow discharge may leak from your breasts called colostrum.  Your belly button may stick out.  You may feel short of breath because of your expanding uterus.  You may notice the fetus "dropping," or moving lower in your abdomen.  You may have a bloody mucus discharge. This usually occurs a few days to a week before labor begins.  Your cervix becomes thin and soft (effaced) near your due date. WHAT TO EXPECT AT YOUR  PRENATAL EXAMS  You will have prenatal exams every 2 weeks until week 36. Then, you will have weekly prenatal exams. During a routine prenatal visit:  You will be weighed to make sure you and the fetus are growing normally.  Your blood pressure is taken.  Your abdomen will be measured to track your baby's growth.  The fetal heartbeat will be listened to.  Any test results from the previous visit will be discussed.  You may have a cervical check near your due date to see if you have effaced. At around 36 weeks, your caregiver will check your cervix. At the same time, your caregiver will also perform a test on the secretions of the vaginal tissue. This test is to determine if a type of bacteria, Group B streptococcus, is present. Your caregiver will explain this further. Your caregiver may ask you:  What your birth plan is.  How you are feeling.  If you are feeling the baby move.  If you have had any abnormal symptoms, such as leaking fluid, bleeding, severe headaches, or abdominal cramping.  If you have any questions. Other tests or screenings that may be performed during your third trimester include:  Blood tests that check for low iron levels (anemia).  Fetal testing to check the health, activity level, and growth of the fetus. Testing is done if you have certain medical conditions or if there are problems during the pregnancy. FALSE LABOR You may feel small, irregular contractions that   eventually go away. These are called Braxton Hicks contractions, or false labor. Contractions may last for hours, days, or even weeks before true labor sets in. If contractions come at regular intervals, intensify, or become painful, it is best to be seen by your caregiver.  SIGNS OF LABOR   Menstrual-like cramps.  Contractions that are 5 minutes apart or less.  Contractions that start on the top of the uterus and spread down to the lower abdomen and back.  A sense of increased pelvic  pressure or back pain.  A watery or bloody mucus discharge that comes from the vagina. If you have any of these signs before the 37th week of pregnancy, call your caregiver right away. You need to go to the hospital to get checked immediately. HOME CARE INSTRUCTIONS   Avoid all smoking, herbs, alcohol, and unprescribed drugs. These chemicals affect the formation and growth of the baby.  Follow your caregiver's instructions regarding medicine use. There are medicines that are either safe or unsafe to take during pregnancy.  Exercise only as directed by your caregiver. Experiencing uterine cramps is a good sign to stop exercising.  Continue to eat regular, healthy meals.  Wear a good support bra for breast tenderness.  Do not use hot tubs, steam rooms, or saunas.  Wear your seat belt at all times when driving.  Avoid raw meat, uncooked cheese, cat litter boxes, and soil used by cats. These carry germs that can cause birth defects in the baby.  Take your prenatal vitamins.  Try taking a stool softener (if your caregiver approves) if you develop constipation. Eat more high-fiber foods, such as fresh vegetables or fruit and whole grains. Drink plenty of fluids to keep your urine clear or pale yellow.  Take warm sitz baths to soothe any pain or discomfort caused by hemorrhoids. Use hemorrhoid cream if your caregiver approves.  If you develop varicose veins, wear support hose. Elevate your feet for 15 minutes, 3-4 times a day. Limit salt in your diet.  Avoid heavy lifting, wear low heal shoes, and practice good posture.  Rest a lot with your legs elevated if you have leg cramps or low back pain.  Visit your dentist if you have not gone during your pregnancy. Use a soft toothbrush to brush your teeth and be gentle when you floss.  A sexual relationship may be continued unless your caregiver directs you otherwise.  Do not travel far distances unless it is absolutely necessary and only  with the approval of your caregiver.  Take prenatal classes to understand, practice, and ask questions about the labor and delivery.  Make a trial run to the hospital.  Pack your hospital bag.  Prepare the baby's nursery.  Continue to go to all your prenatal visits as directed by your caregiver. SEEK MEDICAL CARE IF:  You are unsure if you are in labor or if your water has broken.  You have dizziness.  You have mild pelvic cramps, pelvic pressure, or nagging pain in your abdominal area.  You have persistent nausea, vomiting, or diarrhea.  You have a bad smelling vaginal discharge.  You have pain with urination. SEEK IMMEDIATE MEDICAL CARE IF:   You have a fever.  You are leaking fluid from your vagina.  You have spotting or bleeding from your vagina.  You have severe abdominal cramping or pain.  You have rapid weight loss or gain.  You have shortness of breath with chest pain.  You notice sudden or extreme swelling   of your face, hands, ankles, feet, or legs.  You have not felt your baby move in over an hour.  You have severe headaches that do not go away with medicine.  You have vision changes. Document Released: 09/23/2001 Document Revised: 10/04/2013 Document Reviewed: 11/30/2012 [employer] Patient Information 2015 [employer]. This information is not intended to replace advice given to you by your health care provider. Make sure you discuss any questions you have with your health care provider.  Breastfeeding Deciding to breastfeed is one of the best choices you can make for you and your baby. A change in hormones during pregnancy causes your breast tissue to grow and increases the number and size of your milk ducts. These hormones also allow proteins, sugars, and fats from your blood supply to make breast milk in your milk-producing glands. Hormones prevent breast milk from being released before your baby is born as well as prompt milk flow after birth. Once  breastfeeding has begun, thoughts of your baby, as well as his or her sucking or crying, can stimulate the release of milk from your milk-producing glands.  BENEFITS OF BREASTFEEDING For Your Baby  Your first milk (colostrum) helps your baby's digestive system function better.   There are antibodies in your milk that help your baby fight off infections.   Your baby has a lower incidence of asthma, allergies, and sudden infant death syndrome.   The nutrients in breast milk are better for your baby than infant formulas and are designed uniquely for your baby's needs.   Breast milk improves your baby's brain development.   Your baby is less likely to develop other conditions, such as childhood obesity, asthma, or type 2 diabetes mellitus.  For You   Breastfeeding helps to create a very special bond between you and your baby.   Breastfeeding is convenient. Breast milk is always available at the correct temperature and costs nothing.   Breastfeeding helps to burn calories and helps you lose the weight gained during pregnancy.   Breastfeeding makes your uterus contract to its prepregnancy size faster and slows bleeding (lochia) after you give birth.   Breastfeeding helps to lower your risk of developing type 2 diabetes mellitus, osteoporosis, and breast or ovarian cancer later in life. SIGNS THAT YOUR BABY IS HUNGRY Early Signs of Hunger  Increased alertness or activity.  Stretching.  Movement of the head from side to side.  Movement of the head and opening of the mouth when the corner of the mouth or cheek is stroked (rooting).  Increased sucking sounds, smacking lips, cooing, sighing, or squeaking.  Hand-to-mouth movements.  Increased sucking of fingers or hands. Late Signs of Hunger  Fussing.  Intermittent crying. Extreme Signs of Hunger Signs of extreme hunger will require calming and consoling before your baby will be able to breastfeed successfully. Do not  wait for the following signs of extreme hunger to occur before you initiate breastfeeding:   Restlessness.  A loud, strong cry.   Screaming. BREASTFEEDING BASICS Breastfeeding Initiation  Find a comfortable place to sit or lie down, with your neck and back well supported.  Place a pillow or rolled up blanket under your baby to bring him or her to the level of your breast (if you are seated). Nursing pillows are specially designed to help support your arms and your baby while you breastfeed.  Make sure that your baby's abdomen is facing your abdomen.   Gently massage your breast. With your fingertips, massage from your chest   wall toward your nipple in a circular motion. This encourages milk flow. You may need to continue this action during the feeding if your milk flows slowly.  Support your breast with 4 fingers underneath and your thumb above your nipple. Make sure your fingers are well away from your nipple and your baby's mouth.   Stroke your baby's lips gently with your finger or nipple.   When your baby's mouth is open wide enough, quickly bring your baby to your breast, placing your entire nipple and as much of the colored area around your nipple (areola) as possible into your baby's mouth.   More areola should be visible above your baby's upper lip than below the lower lip.   Your baby's tongue should be between his or her lower gum and your breast.   Ensure that your baby's mouth is correctly positioned around your nipple (latched). Your baby's lips should create a seal on your breast and be turned out (everted).  It is common for your baby to suck about 2-3 minutes in order to start the flow of breast milk. Latching Teaching your baby how to latch on to your breast properly is very important. An improper latch can cause nipple pain and decreased milk supply for you and poor weight gain in your baby. Also, if your baby is not latched onto your nipple properly, he or she  may swallow some air during feeding. This can make your baby fussy. Burping your baby when you switch breasts during the feeding can help to get rid of the air. However, teaching your baby to latch on properly is still the best way to prevent fussiness from swallowing air while breastfeeding. Signs that your baby has successfully latched on to your nipple:    Silent tugging or silent sucking, without causing you pain.   Swallowing heard between every 3-4 sucks.    Muscle movement above and in front of his or her ears while sucking.  Signs that your baby has not successfully latched on to nipple:   Sucking sounds or smacking sounds from your baby while breastfeeding.  Nipple pain. If you think your baby has not latched on correctly, slip your finger into the corner of your baby's mouth to break the suction and place it between your baby's gums. Attempt breastfeeding initiation again. Signs of Successful Breastfeeding Signs from your baby:   A gradual decrease in the number of sucks or complete cessation of sucking.   Falling asleep.   Relaxation of his or her body.   Retention of a small amount of milk in his or her mouth.   Letting go of your breast by himself or herself. Signs from you:  Breasts that have increased in firmness, weight, and size 1-3 hours after feeding.   Breasts that are softer immediately after breastfeeding.  Increased milk volume, as well as a change in milk consistency and color by the fifth day of breastfeeding.   Nipples that are not sore, cracked, or bleeding. Signs That Your Baby is Getting Enough Milk  Wetting at least 3 diapers in a 24-hour period. The urine should be clear and pale yellow by age 5 days.  At least 3 stools in a 24-hour period by age 5 days. The stool should be soft and yellow.  At least 3 stools in a 24-hour period by age 7 days. The stool should be seedy and yellow.  No loss of weight greater than 10% of birth weight  during the first 3   days of age.  Average weight gain of 4-7 ounces (113-198 g) per week after age 4 days.  Consistent daily weight gain by age 5 days, without weight loss after the age of 2 weeks. After a feeding, your baby may spit up a small amount. This is common. BREASTFEEDING FREQUENCY AND DURATION Frequent feeding will help you make more milk and can prevent sore nipples and breast engorgement. Breastfeed when you feel the need to reduce the fullness of your breasts or when your baby shows signs of hunger. This is called "breastfeeding on demand." Avoid introducing a pacifier to your baby while you are working to establish breastfeeding (the first 4-6 weeks after your baby is born). After this time you may choose to use a pacifier. Research has shown that pacifier use during the first year of a baby's life decreases the risk of sudden infant death syndrome (SIDS). Allow your baby to feed on each breast as long as he or she wants. Breastfeed until your baby is finished feeding. When your baby unlatches or falls asleep while feeding from the first breast, offer the second breast. Because newborns are often sleepy in the first few weeks of life, you may need to awaken your baby to get him or her to feed. Breastfeeding times will vary from baby to baby. However, the following rules can serve as a guide to help you ensure that your baby is properly fed:  Newborns (babies 4 weeks of age or younger) may breastfeed every 1-3 hours.  Newborns should not go longer than 3 hours during the day or 5 hours during the night without breastfeeding.  You should breastfeed your baby a minimum of 8 times in a 24-hour period until you begin to introduce solid foods to your baby at around 6 months of age. BREAST MILK PUMPING Pumping and storing breast milk allows you to ensure that your baby is exclusively fed your breast milk, even at times when you are unable to breastfeed. This is especially important if you are  going back to work while you are still breastfeeding or when you are not able to be present during feedings. Your lactation consultant can give you guidelines on how long it is safe to store breast milk.  A breast pump is a machine that allows you to pump milk from your breast into a sterile bottle. The pumped breast milk can then be stored in a refrigerator or freezer. Some breast pumps are operated by hand, while others use electricity. Ask your lactation consultant which type will work best for you. Breast pumps can be purchased, but some hospitals and breastfeeding support groups lease breast pumps on a monthly basis. A lactation consultant can teach you how to hand express breast milk, if you prefer not to use a pump.  CARING FOR YOUR BREASTS WHILE YOU BREASTFEED Nipples can become dry, cracked, and sore while breastfeeding. The following recommendations can help keep your breasts moisturized and healthy:  Avoid using soap on your nipples.   Wear a supportive bra. Although not required, special nursing bras and tank tops are designed to allow access to your breasts for breastfeeding without taking off your entire bra or top. Avoid wearing underwire-style bras or extremely tight bras.  Air dry your nipples for 3-4minutes after each feeding.   Use only cotton bra pads to absorb leaked breast milk. Leaking of breast milk between feedings is normal.   Use lanolin on your nipples after breastfeeding. Lanolin helps to maintain your skin's   normal moisture barrier. If you use pure lanolin, you do not need to wash it off before feeding your baby again. Pure lanolin is not toxic to your baby. You may also hand express a few drops of breast milk and gently massage that milk into your nipples and allow the milk to air dry. In the first few weeks after giving birth, some women experience extremely full breasts (engorgement). Engorgement can make your breasts feel heavy, warm, and tender to the touch.  Engorgement peaks within 3-5 days after you give birth. The following recommendations can help ease engorgement:  Completely empty your breasts while breastfeeding or pumping. You may want to start by applying warm, moist heat (in the shower or with warm water-soaked hand towels) just before feeding or pumping. This increases circulation and helps the milk flow. If your baby does not completely empty your breasts while breastfeeding, pump any extra milk after he or she is finished.  Wear a snug bra (nursing or regular) or tank top for 1-2 days to signal your body to slightly decrease milk production.  Apply ice packs to your breasts, unless this is too uncomfortable for you.  Make sure that your baby is latched on and positioned properly while breastfeeding. If engorgement persists after 48 hours of following these recommendations, contact your health care provider or a lactation consultant. OVERALL HEALTH CARE RECOMMENDATIONS WHILE BREASTFEEDING  Eat healthy foods. Alternate between meals and snacks, eating 3 of each per day. Because what you eat affects your breast milk, some of the foods may make your baby more irritable than usual. Avoid eating these foods if you are sure that they are negatively affecting your baby.  Drink milk, fruit juice, and water to satisfy your thirst (about 10 glasses a day).   Rest often, relax, and continue to take your prenatal vitamins to prevent fatigue, stress, and anemia.  Continue breast self-awareness checks.  Avoid chewing and smoking tobacco.  Avoid alcohol and drug use. Some medicines that may be harmful to your baby can pass through breast milk. It is important to ask your health care provider before taking any medicine, including all over-the-counter and prescription medicine as well as vitamin and herbal supplements. It is possible to become pregnant while breastfeeding. If birth control is desired, ask your health care provider about options that  will be safe for your baby. SEEK MEDICAL CARE IF:   You feel like you want to stop breastfeeding or have become frustrated with breastfeeding.  You have painful breasts or nipples.  Your nipples are cracked or bleeding.  Your breasts are red, tender, or warm.  You have a swollen area on either breast.  You have a fever or chills.  You have nausea or vomiting.  You have drainage other than breast milk from your nipples.  Your breasts do not become full before feedings by the fifth day after you give birth.  You feel sad and depressed.  Your baby is too sleepy to eat well.  Your baby is having trouble sleeping.   Your baby is wetting less than 3 diapers in a 24-hour period.  Your baby has less than 3 stools in a 24-hour period.  Your baby's skin or the white part of his or her eyes becomes yellow.   Your baby is not gaining weight by 5 days of age. SEEK IMMEDIATE MEDICAL CARE IF:   Your baby is overly tired (lethargic) and does not want to wake up and feed.  Your baby   develops an unexplained fever. Document Released: 09/29/2005 Document Revised: 10/04/2013 Document Reviewed: 03/23/2013 [employer] Patient Information 2015 [employer]. This information is not intended to replace advice given to you by your health care provider. Make sure you discuss any questions you have with your health care provider.  

## 2015-07-03 NOTE — Progress Notes (Signed)
Subjective:  Betty Shelton is a 29 y.o. G3P1011 at [redacted]w[redacted]d being seen today for ongoing prenatal care.  Patient reports vaginal irritation and discharge.  Contractions: Not present.  Vag. Bleeding: None. Movement: Present. Denies leaking of fluid.   The following portions of the patient's history were reviewed and updated as appropriate: allergies, current medications, past family history, past medical history, past social history, past surgical history and problem list.   Objective:   Filed Vitals:   07/03/15 1322  BP: 117/75  Pulse: 84  Weight: 297 lb (134.718 kg)    Fetal Status: Fetal Heart Rate (bpm): 143 Fundal Height: 33 cm Movement: Present  Presentation: Vertex  General:  Alert, oriented and cooperative. Patient is in no acute distress.  Skin: Skin is warm and dry. No rash noted.   Cardiovascular: Normal heart rate noted  Respiratory: Normal respiratory effort, no problems with respiration noted  Abdomen: Soft, gravid, appropriate for gestational age. Pain/Pressure: Present     Pelvic: Vag. Bleeding: None Vag D/C Character: White   Cervical exam deferred       Thick cottage cheese adherent discharge noted  Extremities: Normal range of motion.  Edema: None  Mental Status: Normal mood and affect. Normal behavior. Normal judgment and thought content.   Urinalysis: Urine Protein: 1+ Urine Glucose: Negative  Assessment and Plan:  Pregnancy: G3P1011 at [redacted]w[redacted]d  1. Encounter for supervision of other normal pregnancy in third trimester Continue routine prenatal care.  2. Previous cesarean delivery, antepartum Desires TOLAC  3. Dysuria during pregnancy, third trimester - POCT Urinalysis Dipstick - Culture, OB Urine  4. Vaginal discharge during pregnancy, third trimester  - Wet prep, genital - terconazole (TERAZOL 7) 0.4 % vaginal cream; Place 1 applicator vaginally at bedtime.  Dispense: 45 g; Refill: 0  Preterm labor symptoms and general obstetric precautions including but  not limited to vaginal bleeding, contractions, leaking of fluid and fetal movement were reviewed in detail with the patient. Please refer to After Visit Summary for other counseling recommendations.  Return in 2 weeks (on 07/17/2015).   Reva Bores, MD
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
## 2015-07-04 LAB — WET PREP, GENITAL
Clue Cells Wet Prep HPF POC: NONE SEEN
TRICH WET PREP: NONE SEEN

## 2015-07-04 LAB — CULTURE, OB URINE

## 2015-07-17 ENCOUNTER — Encounter: Payer: Self-pay | Admitting: Family Medicine

## 2015-07-17 ENCOUNTER — Ambulatory Visit (INDEPENDENT_AMBULATORY_CARE_PROVIDER_SITE_OTHER): Payer: BLUE CROSS/BLUE SHIELD | Admitting: Family Medicine

## 2015-07-17 VITALS — BP 132/79 | HR 63 | Wt 301.0 lb

## 2015-07-17 DIAGNOSIS — O34219 Maternal care for unspecified type scar from previous cesarean delivery: Secondary | ICD-10-CM

## 2015-07-17 DIAGNOSIS — Z3483 Encounter for supervision of other normal pregnancy, third trimester: Secondary | ICD-10-CM

## 2015-07-17 NOTE — Patient Instructions (Signed)
Third Trimester of Pregnancy The third trimester is from week 29 through week 42, months 7 through 9. The third trimester is a time when the fetus is growing rapidly. At the end of the ninth month, the fetus is about 20 inches in length and weighs 6-10 pounds.  BODY CHANGES Your body goes through many changes during pregnancy. The changes vary from woman to woman.   Your weight will continue to increase. You can expect to gain 25-35 pounds (11-16 kg) by the end of the pregnancy.  You may begin to get stretch marks on your hips, abdomen, and breasts.  You may urinate more often because the fetus is moving lower into your pelvis and pressing on your bladder.  You may develop or continue to have heartburn as a result of your pregnancy.  You may develop constipation because certain hormones are causing the muscles that push waste through your intestines to slow down.  You may develop hemorrhoids or swollen, bulging veins (varicose veins).  You may have pelvic pain because of the weight gain and pregnancy hormones relaxing your joints between the bones in your pelvis. Backaches may result from overexertion of the muscles supporting your posture.  You may have changes in your hair. These can include thickening of your hair, rapid growth, and changes in texture. Some women also have hair loss during or after pregnancy, or hair that feels dry or thin. Your hair will most likely return to normal after your baby is born.  Your breasts will continue to grow and be tender. A yellow discharge may leak from your breasts called colostrum.  Your belly button may stick out.  You may feel short of breath because of your expanding uterus.  You may notice the fetus "dropping," or moving lower in your abdomen.  You may have a bloody mucus discharge. This usually occurs a few days to a week before labor begins.  Your cervix becomes thin and soft (effaced) near your due date. WHAT TO EXPECT AT YOUR  PRENATAL EXAMS  You will have prenatal exams every 2 weeks until week 36. Then, you will have weekly prenatal exams. During a routine prenatal visit:  You will be weighed to make sure you and the fetus are growing normally.  Your blood pressure is taken.  Your abdomen will be measured to track your baby's growth.  The fetal heartbeat will be listened to.  Any test results from the previous visit will be discussed.  You may have a cervical check near your due date to see if you have effaced. At around 36 weeks, your caregiver will check your cervix. At the same time, your caregiver will also perform a test on the secretions of the vaginal tissue. This test is to determine if a type of bacteria, Group B streptococcus, is present. Your caregiver will explain this further. Your caregiver may ask you:  What your birth plan is.  How you are feeling.  If you are feeling the baby move.  If you have had any abnormal symptoms, such as leaking fluid, bleeding, severe headaches, or abdominal cramping.  If you have any questions. Other tests or screenings that may be performed during your third trimester include:  Blood tests that check for low iron levels (anemia).  Fetal testing to check the health, activity level, and growth of the fetus. Testing is done if you have certain medical conditions or if there are problems during the pregnancy. FALSE LABOR You may feel small, irregular contractions that   eventually go away. These are called Braxton Hicks contractions, or false labor. Contractions may last for hours, days, or even weeks before true labor sets in. If contractions come at regular intervals, intensify, or become painful, it is best to be seen by your caregiver.  SIGNS OF LABOR   Menstrual-like cramps.  Contractions that are 5 minutes apart or less.  Contractions that start on the top of the uterus and spread down to the lower abdomen and back.  A sense of increased pelvic  pressure or back pain.  A watery or bloody mucus discharge that comes from the vagina. If you have any of these signs before the 37th week of pregnancy, call your caregiver right away. You need to go to the hospital to get checked immediately. HOME CARE INSTRUCTIONS   Avoid all smoking, herbs, alcohol, and unprescribed drugs. These chemicals affect the formation and growth of the baby.  Follow your caregiver's instructions regarding medicine use. There are medicines that are either safe or unsafe to take during pregnancy.  Exercise only as directed by your caregiver. Experiencing uterine cramps is a good sign to stop exercising.  Continue to eat regular, healthy meals.  Wear a good support bra for breast tenderness.  Do not use hot tubs, steam rooms, or saunas.  Wear your seat belt at all times when driving.  Avoid raw meat, uncooked cheese, cat litter boxes, and soil used by cats. These carry germs that can cause birth defects in the baby.  Take your prenatal vitamins.  Try taking a stool softener (if your caregiver approves) if you develop constipation. Eat more high-fiber foods, such as fresh vegetables or fruit and whole grains. Drink plenty of fluids to keep your urine clear or pale yellow.  Take warm sitz baths to soothe any pain or discomfort caused by hemorrhoids. Use hemorrhoid cream if your caregiver approves.  If you develop varicose veins, wear support hose. Elevate your feet for 15 minutes, 3-4 times a day. Limit salt in your diet.  Avoid heavy lifting, wear low heal shoes, and practice good posture.  Rest a lot with your legs elevated if you have leg cramps or low back pain.  Visit your dentist if you have not gone during your pregnancy. Use a soft toothbrush to brush your teeth and be gentle when you floss.  A sexual relationship may be continued unless your caregiver directs you otherwise.  Do not travel far distances unless it is absolutely necessary and only  with the approval of your caregiver.  Take prenatal classes to understand, practice, and ask questions about the labor and delivery.  Make a trial run to the hospital.  Pack your hospital bag.  Prepare the baby's nursery.  Continue to go to all your prenatal visits as directed by your caregiver. SEEK MEDICAL CARE IF:  You are unsure if you are in labor or if your water has broken.  You have dizziness.  You have mild pelvic cramps, pelvic pressure, or nagging pain in your abdominal area.  You have persistent nausea, vomiting, or diarrhea.  You have a bad smelling vaginal discharge.  You have pain with urination. SEEK IMMEDIATE MEDICAL CARE IF:   You have a fever.  You are leaking fluid from your vagina.  You have spotting or bleeding from your vagina.  You have severe abdominal cramping or pain.  You have rapid weight loss or gain.  You have shortness of breath with chest pain.  You notice sudden or extreme swelling   of your face, hands, ankles, feet, or legs.  You have not felt your baby move in over an hour.  You have severe headaches that do not go away with medicine.  You have vision changes. Document Released: 09/23/2001 Document Revised: 10/04/2013 Document Reviewed: 11/30/2012 [employer] Patient Information 2015 [employer]. This information is not intended to replace advice given to you by your health care provider. Make sure you discuss any questions you have with your health care provider.  Breastfeeding Deciding to breastfeed is one of the best choices you can make for you and your baby. A change in hormones during pregnancy causes your breast tissue to grow and increases the number and size of your milk ducts. These hormones also allow proteins, sugars, and fats from your blood supply to make breast milk in your milk-producing glands. Hormones prevent breast milk from being released before your baby is born as well as prompt milk flow after birth. Once  breastfeeding has begun, thoughts of your baby, as well as his or her sucking or crying, can stimulate the release of milk from your milk-producing glands.  BENEFITS OF BREASTFEEDING For Your Baby  Your first milk (colostrum) helps your baby's digestive system function better.   There are antibodies in your milk that help your baby fight off infections.   Your baby has a lower incidence of asthma, allergies, and sudden infant death syndrome.   The nutrients in breast milk are better for your baby than infant formulas and are designed uniquely for your baby's needs.   Breast milk improves your baby's brain development.   Your baby is less likely to develop other conditions, such as childhood obesity, asthma, or type 2 diabetes mellitus.  For You   Breastfeeding helps to create a very special bond between you and your baby.   Breastfeeding is convenient. Breast milk is always available at the correct temperature and costs nothing.   Breastfeeding helps to burn calories and helps you lose the weight gained during pregnancy.   Breastfeeding makes your uterus contract to its prepregnancy size faster and slows bleeding (lochia) after you give birth.   Breastfeeding helps to lower your risk of developing type 2 diabetes mellitus, osteoporosis, and breast or ovarian cancer later in life. SIGNS THAT YOUR BABY IS HUNGRY Early Signs of Hunger  Increased alertness or activity.  Stretching.  Movement of the head from side to side.  Movement of the head and opening of the mouth when the corner of the mouth or cheek is stroked (rooting).  Increased sucking sounds, smacking lips, cooing, sighing, or squeaking.  Hand-to-mouth movements.  Increased sucking of fingers or hands. Late Signs of Hunger  Fussing.  Intermittent crying. Extreme Signs of Hunger Signs of extreme hunger will require calming and consoling before your baby will be able to breastfeed successfully. Do not  wait for the following signs of extreme hunger to occur before you initiate breastfeeding:   Restlessness.  A loud, strong cry.   Screaming. BREASTFEEDING BASICS Breastfeeding Initiation  Find a comfortable place to sit or lie down, with your neck and back well supported.  Place a pillow or rolled up blanket under your baby to bring him or her to the level of your breast (if you are seated). Nursing pillows are specially designed to help support your arms and your baby while you breastfeed.  Make sure that your baby's abdomen is facing your abdomen.   Gently massage your breast. With your fingertips, massage from your chest   wall toward your nipple in a circular motion. This encourages milk flow. You may need to continue this action during the feeding if your milk flows slowly.  Support your breast with 4 fingers underneath and your thumb above your nipple. Make sure your fingers are well away from your nipple and your baby's mouth.   Stroke your baby's lips gently with your finger or nipple.   When your baby's mouth is open wide enough, quickly bring your baby to your breast, placing your entire nipple and as much of the colored area around your nipple (areola) as possible into your baby's mouth.   More areola should be visible above your baby's upper lip than below the lower lip.   Your baby's tongue should be between his or her lower gum and your breast.   Ensure that your baby's mouth is correctly positioned around your nipple (latched). Your baby's lips should create a seal on your breast and be turned out (everted).  It is common for your baby to suck about 2-3 minutes in order to start the flow of breast milk. Latching Teaching your baby how to latch on to your breast properly is very important. An improper latch can cause nipple pain and decreased milk supply for you and poor weight gain in your baby. Also, if your baby is not latched onto your nipple properly, he or she  may swallow some air during feeding. This can make your baby fussy. Burping your baby when you switch breasts during the feeding can help to get rid of the air. However, teaching your baby to latch on properly is still the best way to prevent fussiness from swallowing air while breastfeeding. Signs that your baby has successfully latched on to your nipple:    Silent tugging or silent sucking, without causing you pain.   Swallowing heard between every 3-4 sucks.    Muscle movement above and in front of his or her ears while sucking.  Signs that your baby has not successfully latched on to nipple:   Sucking sounds or smacking sounds from your baby while breastfeeding.  Nipple pain. If you think your baby has not latched on correctly, slip your finger into the corner of your baby's mouth to break the suction and place it between your baby's gums. Attempt breastfeeding initiation again. Signs of Successful Breastfeeding Signs from your baby:   A gradual decrease in the number of sucks or complete cessation of sucking.   Falling asleep.   Relaxation of his or her body.   Retention of a small amount of milk in his or her mouth.   Letting go of your breast by himself or herself. Signs from you:  Breasts that have increased in firmness, weight, and size 1-3 hours after feeding.   Breasts that are softer immediately after breastfeeding.  Increased milk volume, as well as a change in milk consistency and color by the fifth day of breastfeeding.   Nipples that are not sore, cracked, or bleeding. Signs That Your Baby is Getting Enough Milk  Wetting at least 3 diapers in a 24-hour period. The urine should be clear and pale yellow by age 5 days.  At least 3 stools in a 24-hour period by age 5 days. The stool should be soft and yellow.  At least 3 stools in a 24-hour period by age 7 days. The stool should be seedy and yellow.  No loss of weight greater than 10% of birth weight  during the first 3   days of age.  Average weight gain of 4-7 ounces (113-198 g) per week after age 4 days.  Consistent daily weight gain by age 5 days, without weight loss after the age of 2 weeks. After a feeding, your baby may spit up a small amount. This is common. BREASTFEEDING FREQUENCY AND DURATION Frequent feeding will help you make more milk and can prevent sore nipples and breast engorgement. Breastfeed when you feel the need to reduce the fullness of your breasts or when your baby shows signs of hunger. This is called "breastfeeding on demand." Avoid introducing a pacifier to your baby while you are working to establish breastfeeding (the first 4-6 weeks after your baby is born). After this time you may choose to use a pacifier. Research has shown that pacifier use during the first year of a baby's life decreases the risk of sudden infant death syndrome (SIDS). Allow your baby to feed on each breast as long as he or she wants. Breastfeed until your baby is finished feeding. When your baby unlatches or falls asleep while feeding from the first breast, offer the second breast. Because newborns are often sleepy in the first few weeks of life, you may need to awaken your baby to get him or her to feed. Breastfeeding times will vary from baby to baby. However, the following rules can serve as a guide to help you ensure that your baby is properly fed:  Newborns (babies 4 weeks of age or younger) may breastfeed every 1-3 hours.  Newborns should not go longer than 3 hours during the day or 5 hours during the night without breastfeeding.  You should breastfeed your baby a minimum of 8 times in a 24-hour period until you begin to introduce solid foods to your baby at around 6 months of age. BREAST MILK PUMPING Pumping and storing breast milk allows you to ensure that your baby is exclusively fed your breast milk, even at times when you are unable to breastfeed. This is especially important if you are  going back to work while you are still breastfeeding or when you are not able to be present during feedings. Your lactation consultant can give you guidelines on how long it is safe to store breast milk.  A breast pump is a machine that allows you to pump milk from your breast into a sterile bottle. The pumped breast milk can then be stored in a refrigerator or freezer. Some breast pumps are operated by hand, while others use electricity. Ask your lactation consultant which type will work best for you. Breast pumps can be purchased, but some hospitals and breastfeeding support groups lease breast pumps on a monthly basis. A lactation consultant can teach you how to hand express breast milk, if you prefer not to use a pump.  CARING FOR YOUR BREASTS WHILE YOU BREASTFEED Nipples can become dry, cracked, and sore while breastfeeding. The following recommendations can help keep your breasts moisturized and healthy:  Avoid using soap on your nipples.   Wear a supportive bra. Although not required, special nursing bras and tank tops are designed to allow access to your breasts for breastfeeding without taking off your entire bra or top. Avoid wearing underwire-style bras or extremely tight bras.  Air dry your nipples for 3-4minutes after each feeding.   Use only cotton bra pads to absorb leaked breast milk. Leaking of breast milk between feedings is normal.   Use lanolin on your nipples after breastfeeding. Lanolin helps to maintain your skin's   normal moisture barrier. If you use pure lanolin, you do not need to wash it off before feeding your baby again. Pure lanolin is not toxic to your baby. You may also hand express a few drops of breast milk and gently massage that milk into your nipples and allow the milk to air dry. In the first few weeks after giving birth, some women experience extremely full breasts (engorgement). Engorgement can make your breasts feel heavy, warm, and tender to the touch.  Engorgement peaks within 3-5 days after you give birth. The following recommendations can help ease engorgement:  Completely empty your breasts while breastfeeding or pumping. You may want to start by applying warm, moist heat (in the shower or with warm water-soaked hand towels) just before feeding or pumping. This increases circulation and helps the milk flow. If your baby does not completely empty your breasts while breastfeeding, pump any extra milk after he or she is finished.  Wear a snug bra (nursing or regular) or tank top for 1-2 days to signal your body to slightly decrease milk production.  Apply ice packs to your breasts, unless this is too uncomfortable for you.  Make sure that your baby is latched on and positioned properly while breastfeeding. If engorgement persists after 48 hours of following these recommendations, contact your health care provider or a lactation consultant. OVERALL HEALTH CARE RECOMMENDATIONS WHILE BREASTFEEDING  Eat healthy foods. Alternate between meals and snacks, eating 3 of each per day. Because what you eat affects your breast milk, some of the foods may make your baby more irritable than usual. Avoid eating these foods if you are sure that they are negatively affecting your baby.  Drink milk, fruit juice, and water to satisfy your thirst (about 10 glasses a day).   Rest often, relax, and continue to take your prenatal vitamins to prevent fatigue, stress, and anemia.  Continue breast self-awareness checks.  Avoid chewing and smoking tobacco.  Avoid alcohol and drug use. Some medicines that may be harmful to your baby can pass through breast milk. It is important to ask your health care provider before taking any medicine, including all over-the-counter and prescription medicine as well as vitamin and herbal supplements. It is possible to become pregnant while breastfeeding. If birth control is desired, ask your health care provider about options that  will be safe for your baby. SEEK MEDICAL CARE IF:   You feel like you want to stop breastfeeding or have become frustrated with breastfeeding.  You have painful breasts or nipples.  Your nipples are cracked or bleeding.  Your breasts are red, tender, or warm.  You have a swollen area on either breast.  You have a fever or chills.  You have nausea or vomiting.  You have drainage other than breast milk from your nipples.  Your breasts do not become full before feedings by the fifth day after you give birth.  You feel sad and depressed.  Your baby is too sleepy to eat well.  Your baby is having trouble sleeping.   Your baby is wetting less than 3 diapers in a 24-hour period.  Your baby has less than 3 stools in a 24-hour period.  Your baby's skin or the white part of his or her eyes becomes yellow.   Your baby is not gaining weight by 5 days of age. SEEK IMMEDIATE MEDICAL CARE IF:   Your baby is overly tired (lethargic) and does not want to wake up and feed.  Your baby   develops an unexplained fever. Document Released: 09/29/2005 Document Revised: 10/04/2013 Document Reviewed: 03/23/2013 [employer] Patient Information 2015 [employer]. This information is not intended to replace advice given to you by your health care provider. Make sure you discuss any questions you have with your health care provider.  

## 2015-07-17 NOTE — Progress Notes (Signed)
Subjective:  Betty Shelton is a 29 y.o. G3P1011 at [redacted]w[redacted]d being seen today for ongoing prenatal care.  Patient reports no complaints.  Contractions: Not present.  Vag. Bleeding: None. Movement: Present. Denies leaking of fluid.   The following portions of the patient's history were reviewed and updated as appropriate: allergies, current medications, past family history, past medical history, past social history, past surgical history and problem list.   Objective:   Filed Vitals:   07/17/15 1322  BP: 132/79  Pulse: 63  Weight: 301 lb (136.533 kg)    Fetal Status: Fetal Heart Rate (bpm): 130 Fundal Height: 35 cm Movement: Present     General:  Alert, oriented and cooperative. Patient is in no acute distress.  Skin: Skin is warm and dry. No rash noted.   Cardiovascular: Normal heart rate noted  Respiratory: Normal respiratory effort, no problems with respiration noted  Abdomen: Soft, gravid, appropriate for gestational age. Pain/Pressure: Present     Pelvic: Vag. Bleeding: None Vag D/C Character: Thin   Cervical exam deferred        Extremities: Normal range of motion.  Edema: None  Mental Status: Normal mood and affect. Normal behavior. Normal judgment and thought content.   Urinalysis: Urine Protein: Negative Urine Glucose: Negative  Assessment and Plan:  Pregnancy: G3P1011 at [redacted]w[redacted]d  1. Encounter for supervision of other normal pregnancy in third trimester Continue routine prenatal care.  2. Previous cesarean delivery, antepartum Still trying to decide due to child care for her other child. Might like it done on a Friday.  Would be 39 wks on Monday 10/31.  Term labor symptoms and general obstetric precautions including but not limited to vaginal bleeding, contractions, leaking of fluid and fetal movement were reviewed in detail with the patient. Please refer to After Visit Summary for other counseling recommendations.  Return in 1 week (on 07/24/2015).   Reva Bores,  MD

## 2015-07-23 ENCOUNTER — Other Ambulatory Visit: Payer: Self-pay | Admitting: Obstetrics & Gynecology

## 2015-07-23 ENCOUNTER — Ambulatory Visit (HOSPITAL_COMMUNITY)
Admission: RE | Admit: 2015-07-23 | Discharge: 2015-07-23 | Disposition: A | Discharge status: Home / Self Care | Payer: BLUE CROSS/BLUE SHIELD | Source: Ambulatory Visit | Attending: Obstetrics & Gynecology | Admitting: Obstetrics & Gynecology

## 2015-07-23 DIAGNOSIS — O99213 Obesity complicating pregnancy, third trimester: Secondary | ICD-10-CM

## 2015-07-23 DIAGNOSIS — O34219 Maternal care for unspecified type scar from previous cesarean delivery: Secondary | ICD-10-CM | POA: Diagnosis not present

## 2015-07-23 DIAGNOSIS — Z3A36 36 weeks gestation of pregnancy: Secondary | ICD-10-CM | POA: Diagnosis not present

## 2015-07-23 DIAGNOSIS — Z8759 Personal history of other complications of pregnancy, childbirth and the puerperium: Secondary | ICD-10-CM

## 2015-07-24 ENCOUNTER — Ambulatory Visit (INDEPENDENT_AMBULATORY_CARE_PROVIDER_SITE_OTHER): Payer: BLUE CROSS/BLUE SHIELD | Admitting: Obstetrics & Gynecology

## 2015-07-24 VITALS — BP 126/81 | HR 62 | Wt 306.0 lb

## 2015-07-24 DIAGNOSIS — Z3483 Encounter for supervision of other normal pregnancy, third trimester: Secondary | ICD-10-CM

## 2015-07-24 DIAGNOSIS — E669 Obesity, unspecified: Secondary | ICD-10-CM

## 2015-07-24 DIAGNOSIS — O3663X0 Maternal care for excessive fetal growth, third trimester, not applicable or unspecified: Secondary | ICD-10-CM

## 2015-07-24 DIAGNOSIS — O34219 Maternal care for unspecified type scar from previous cesarean delivery: Secondary | ICD-10-CM

## 2015-07-24 DIAGNOSIS — Z113 Encounter for screening for infections with a predominantly sexual mode of transmission: Secondary | ICD-10-CM

## 2015-07-24 DIAGNOSIS — O99213 Obesity complicating pregnancy, third trimester: Secondary | ICD-10-CM

## 2015-07-24 LAB — OB RESULTS CONSOLE GBS: GBS: NEGATIVE

## 2015-07-24 LAB — OB RESULTS CONSOLE GC/CHLAMYDIA: Gonorrhea: NEGATIVE

## 2015-07-24 NOTE — Progress Notes (Signed)
Subjective:  Betty Shelton is a 29 y.o. G3P1011 at [redacted]w[redacted]d being seen today for ongoing prenatal care.  Patient reports no complaints.  Contractions: Not present.  Vag. Bleeding: None. Movement: Present. Denies leaking of fluid.   The following portions of the patient's history were reviewed and updated as appropriate: allergies, current medications, past family history, past medical history, past social history, past surgical history and problem list. Problem list updated.  Objective:   Filed Vitals:   07/24/15 1318  BP: 126/81  Pulse: 62  Weight: 306 lb (138.801 kg)    Fetal Status: Fetal Heart Rate (bpm): 127 Fundal Height: 37 cm Movement: Present  Presentation: Vertex  General:  Alert, oriented and cooperative. Patient is in no acute distress.  Skin: Skin is warm and dry. No rash noted.   Cardiovascular: Normal heart rate noted  Respiratory: Normal respiratory effort, no problems with respiration noted  Abdomen: Soft, gravid, appropriate for gestational age. Pain/Pressure: Present     Pelvic: Vag. Bleeding: None Vag D/C Character: Thin   Cervical exam performed Dilation: Closed Effacement (%): Thick Station: Ballotable  Extremities: Normal range of motion.  Edema: None  Mental Status: Normal mood and affect. Normal behavior. Normal judgment and thought content.   Urinalysis: Urine Protein: Negative Urine Glucose: Negative Ultrasound: 07/23/15 [redacted]w[redacted]d EFW 3463g (7+10)/>90%, AC >97%, AFI 10.35 cm, cephalic, anterior placenta  Assessment and Plan:  Pregnancy: G3P1011 at [redacted]w[redacted]d  1. Previous cesarean delivery, antepartum Desires TOLAC but now contemplating RCS given fetal size and unfavorable cervix.  Will decide and let us know. If she does decide to proceed with RCS, she wants it scheduled on 08/17/15 due to childcare issues.  2. Fetal macrosomia during pregnancy in third trimester, not applicable or unspecified fetus 07/23/15 [redacted]w[redacted]d EFW 3463g (7+10)/>90%, AC >97%, AFI 10.35 cm,  cephalic, anterior placenta.  Repeat at 39 weeks if still pregnant and still considering TOLAC.  3. Obesity in pregnancy, antepartum, third trimester  4. Encounter for supervision of other normal pregnancy in third trimester Pelvic cultures done today, will follow up results and manage accordingly. - Culture, beta strep (group b only) - GC/Chlamydia probe amp ([place])not at [facility]  Preterm labor symptoms and general obstetric precautions including but not limited to vaginal bleeding, contractions, leaking of fluid and fetal movement were reviewed in detail with the patient. Please refer to After Visit Summary for other counseling recommendations.  Return in about 1 week (around 07/31/2015) for OB Visit.   Tereso Newcomer, MD

## 2015-07-24 NOTE — Patient Instructions (Signed)
Return to clinic for any obstetric concerns or go to [facility] for evaluation  

## 2015-07-26 LAB — GC/CHLAMYDIA PROBE AMP (~~LOC~~) NOT AT ARMC
Chlamydia: NEGATIVE
Neisseria Gonorrhea: NEGATIVE

## 2015-07-26 LAB — CULTURE, BETA STREP (GROUP B ONLY)

## 2015-07-31 ENCOUNTER — Ambulatory Visit (INDEPENDENT_AMBULATORY_CARE_PROVIDER_SITE_OTHER): Payer: BLUE CROSS/BLUE SHIELD | Admitting: Obstetrics & Gynecology

## 2015-07-31 VITALS — BP 138/84 | HR 73 | Wt 305.0 lb

## 2015-07-31 DIAGNOSIS — O34219 Maternal care for unspecified type scar from previous cesarean delivery: Secondary | ICD-10-CM

## 2015-07-31 DIAGNOSIS — Z3483 Encounter for supervision of other normal pregnancy, third trimester: Secondary | ICD-10-CM

## 2015-07-31 NOTE — Progress Notes (Signed)
Subjective:  Betty Shelton is a 29 y.o. G3P1011 at 843w1d being seen today for ongoing prenatal care.  Patient reports no complaints. She recently broke up with FOB.  Contractions: Not present.  Vag. Bleeding: None. Movement: Present. Denies leaking of fluid.   The following portions of the patient's history were reviewed and updated as appropriate: allergies, current medications, past family history, past medical history, past social history, past surgical history and problem list. Problem list updated.  Objective:   Filed Vitals:   07/31/15 1324  BP: 138/84  Pulse: 73  Weight: 305 lb (138.347 kg)    Fetal Status: Fetal Heart Rate (bpm): 125   Movement: Present     General:  Alert, oriented and cooperative. Patient is in no acute distress.  Skin: Skin is warm and dry. No rash noted.   Cardiovascular: Normal heart rate noted  Respiratory: Normal respiratory effort, no problems with respiration noted  Abdomen: Soft, gravid, appropriate for gestational age. Pain/Pressure: Present     Pelvic: Vag. Bleeding: None Vag D/C Character: Thin   Cervical exam deferred        Extremities: Normal range of motion.  Edema: Trace  Mental Status: Normal mood and affect. Normal behavior. Normal judgment and thought content.   Urinalysis: Urine Protein: 1+ Urine Glucose: Negative  Assessment and Plan:  Pregnancy: G3P1011 at 5443w1d  1. Previous cesarean delivery, delivered - She is still interested in a TOLAC - US OB Follow Up; Future  Term labor symptoms and general obstetric precautions including but not limited to vaginal bleeding, contractions, leaking of fluid and fetal movement were reviewed in detail with the patient. Please refer to After Visit Summary for other counseling recommendations.  Return in about 1 week (around 08/07/2015).   Allie BossierMyra C Murlene Revell, MD

## 2015-08-07 ENCOUNTER — Ambulatory Visit (INDEPENDENT_AMBULATORY_CARE_PROVIDER_SITE_OTHER): Payer: BLUE CROSS/BLUE SHIELD | Admitting: Obstetrics & Gynecology

## 2015-08-07 VITALS — BP 137/89 | HR 66 | Wt 306.0 lb

## 2015-08-07 DIAGNOSIS — O133 Gestational [pregnancy-induced] hypertension without significant proteinuria, third trimester: Secondary | ICD-10-CM

## 2015-08-07 DIAGNOSIS — O3663X0 Maternal care for excessive fetal growth, third trimester, not applicable or unspecified: Secondary | ICD-10-CM

## 2015-08-07 DIAGNOSIS — Z3483 Encounter for supervision of other normal pregnancy, third trimester: Secondary | ICD-10-CM

## 2015-08-07 DIAGNOSIS — O163 Unspecified maternal hypertension, third trimester: Secondary | ICD-10-CM

## 2015-08-07 NOTE — Progress Notes (Signed)
Subjective:  Betty Shelton is a 29 y.o. G3P1011 at 3536w1d being seen today for ongoing prenatal care.  Patient reports no complaints.  Contractions: Not present.  Vag. Bleeding: None. Movement: Present. Denies leaking of fluid.   The following portions of the patient's history were reviewed and updated as appropriate: allergies, current medications, past family history, past medical history, past social history, past surgical history and problem list. Problem list updated.  Objective:   Filed Vitals:   08/07/15 1317 08/07/15 1343  BP: 147/84 137/89  Pulse: 66   Weight: 306 lb (138.801 kg)     Fetal Status: Fetal Heart Rate (bpm): 124 Fundal Height: 42 cm Movement: Present  Presentation: Vertex  General:  Alert, oriented and cooperative. Patient is in no acute distress.  Skin: Skin is warm and dry. No rash noted.   Cardiovascular: Normal heart rate noted  Respiratory: Normal respiratory effort, no problems with respiration noted  Abdomen: Soft, gravid, appropriate for gestational age. Pain/Pressure: Present     Pelvic: Vag. Bleeding: None Vag D/C Character: Thin   Cervical exam performed Dilation: Closed Effacement (%): Thick Station: Ballotable  Extremities: Normal range of motion.  Edema: None  Mental Status: Normal mood and affect. Normal behavior. Normal judgment and thought content.   Urinalysis: Urine Protein: Trace Urine Glucose: Negative  Assessment and Plan:  Pregnancy: G3P1011 at 536w1d  1. Elevated blood pressure affecting pregnancy in third trimester, antepartum Recheck BP is at upper limit of normal.  Denies any preeclampsia symptoms; precautions reviewed. Will recheck next week. If meets criteria for GHTN, will have to move towards delivery.  2. Fetal macrosomia during pregnancy in third trimester, not applicable or unspecified fetus EFW >90% at 36 weeks, repeat ultrasound scheduled on 08/13/15 at 39 weeks.  Patient is aware this may affect delivery modality; currently  desires TOLAC and has a reasonable birth plan that I perused today.  3. Encounter for supervision of other normal pregnancy in third trimester Term labor symptoms and general obstetric precautions including but not limited to vaginal bleeding, contractions, leaking of fluid and fetal movement were reviewed in detail with the patient. Please refer to After Visit Summary for other counseling recommendations.  Return in about 1 week (around 08/14/2015) for OB Visit.   Tereso NewcomerUgonna A Nishika Parkhurst, MD

## 2015-08-07 NOTE — Patient Instructions (Signed)
Return to clinic for any obstetric concerns or go to [facility] for evaluation  

## 2015-08-13 ENCOUNTER — Ambulatory Visit (INDEPENDENT_AMBULATORY_CARE_PROVIDER_SITE_OTHER): Payer: BLUE CROSS/BLUE SHIELD | Admitting: Obstetrics & Gynecology

## 2015-08-13 ENCOUNTER — Ambulatory Visit (HOSPITAL_COMMUNITY)
Admission: RE | Admit: 2015-08-13 | Discharge: 2015-08-13 | Disposition: A | Discharge status: Home / Self Care | Payer: BLUE CROSS/BLUE SHIELD | Source: Ambulatory Visit | Attending: Obstetrics & Gynecology | Admitting: Obstetrics & Gynecology

## 2015-08-13 ENCOUNTER — Encounter (HOSPITAL_COMMUNITY): Payer: Self-pay | Admitting: *Deleted

## 2015-08-13 ENCOUNTER — Inpatient Hospital Stay (HOSPITAL_COMMUNITY)
Admission: AD | Admit: 2015-08-13 | Discharge: 2015-08-18 | DRG: 765 | Disposition: A | Discharge status: Home / Self Care | Payer: BLUE CROSS/BLUE SHIELD | Source: Ambulatory Visit | Attending: Obstetrics and Gynecology | Admitting: Obstetrics and Gynecology

## 2015-08-13 ENCOUNTER — Other Ambulatory Visit: Payer: Self-pay | Admitting: Obstetrics & Gynecology

## 2015-08-13 VITALS — BP 142/87 | HR 82 | Wt 311.0 lb

## 2015-08-13 DIAGNOSIS — O99213 Obesity complicating pregnancy, third trimester: Secondary | ICD-10-CM

## 2015-08-13 DIAGNOSIS — O3663X0 Maternal care for excessive fetal growth, third trimester, not applicable or unspecified: Secondary | ICD-10-CM

## 2015-08-13 DIAGNOSIS — Z6841 Body Mass Index (BMI) 40.0 and over, adult: Secondary | ICD-10-CM

## 2015-08-13 DIAGNOSIS — O139 Gestational [pregnancy-induced] hypertension without significant proteinuria, unspecified trimester: Secondary | ICD-10-CM

## 2015-08-13 DIAGNOSIS — Z833 Family history of diabetes mellitus: Secondary | ICD-10-CM | POA: Diagnosis not present

## 2015-08-13 DIAGNOSIS — O09293 Supervision of pregnancy with other poor reproductive or obstetric history, third trimester: Secondary | ICD-10-CM

## 2015-08-13 DIAGNOSIS — O99334 Smoking (tobacco) complicating childbirth: Secondary | ICD-10-CM | POA: Diagnosis present

## 2015-08-13 DIAGNOSIS — Z3A39 39 weeks gestation of pregnancy: Secondary | ICD-10-CM | POA: Diagnosis not present

## 2015-08-13 DIAGNOSIS — O34219 Maternal care for unspecified type scar from previous cesarean delivery: Secondary | ICD-10-CM

## 2015-08-13 DIAGNOSIS — O99214 Obesity complicating childbirth: Secondary | ICD-10-CM | POA: Diagnosis present

## 2015-08-13 DIAGNOSIS — O339 Maternal care for disproportion, unspecified: Secondary | ICD-10-CM | POA: Diagnosis present

## 2015-08-13 DIAGNOSIS — F17210 Nicotine dependence, cigarettes, uncomplicated: Secondary | ICD-10-CM | POA: Diagnosis present

## 2015-08-13 DIAGNOSIS — O134 Gestational [pregnancy-induced] hypertension without significant proteinuria, complicating childbirth: Secondary | ICD-10-CM | POA: Diagnosis present

## 2015-08-13 DIAGNOSIS — O34211 Maternal care for low transverse scar from previous cesarean delivery: Secondary | ICD-10-CM | POA: Diagnosis present

## 2015-08-13 DIAGNOSIS — Z8249 Family history of ischemic heart disease and other diseases of the circulatory system: Secondary | ICD-10-CM | POA: Diagnosis not present

## 2015-08-13 DIAGNOSIS — Z3483 Encounter for supervision of other normal pregnancy, third trimester: Secondary | ICD-10-CM

## 2015-08-13 LAB — CBC
HCT: 34.2 % — ABNORMAL LOW (ref 36.0–46.0)
Hemoglobin: 11.9 g/dL — ABNORMAL LOW (ref 12.0–15.0)
MCH: 30.1 pg (ref 26.0–34.0)
MCHC: 34.8 g/dL (ref 30.0–36.0)
MCV: 86.4 fL (ref 78.0–100.0)
PLATELETS: 200 10*3/uL (ref 150–400)
RBC: 3.96 MIL/uL (ref 3.87–5.11)
RDW: 14 % (ref 11.5–15.5)
WBC: 9.9 10*3/uL (ref 4.0–10.5)

## 2015-08-13 MED ORDER — OXYTOCIN 40 UNITS IN LACTATED RINGERS INFUSION - SIMPLE MED: 62.5000 mL/h | INTRAVENOUS | Status: DC

## 2015-08-13 MED ORDER — LACTATED RINGERS IV SOLN
INTRAVENOUS | Status: DC
  Administered 2015-08-13 – 2015-08-14 (×3): via INTRAVENOUS
  Administered 2015-08-15: 125 mL/h via INTRAVENOUS
  Administered 2015-08-15 (×3): via INTRAVENOUS
  Administered 2015-08-15: 500 mL via INTRAVENOUS
  Administered 2015-08-16 (×2): via INTRAVENOUS

## 2015-08-13 MED ORDER — CITRIC ACID-SODIUM CITRATE 334-500 MG/5ML PO SOLN
30.0000 mL | ORAL | Status: DC | PRN
  Filled 2015-08-13: qty 15

## 2015-08-13 MED ORDER — LIDOCAINE HCL (PF) 1 % IJ SOLN: 30.0000 mL | INTRAMUSCULAR | Status: DC | PRN

## 2015-08-13 MED ORDER — ONDANSETRON HCL 4 MG/2ML IJ SOLN: 4.0000 mg | Freq: Four times a day (QID) | INTRAMUSCULAR | Status: DC | PRN

## 2015-08-13 MED ORDER — OXYTOCIN BOLUS FROM INFUSION: 500.0000 mL | INTRAVENOUS | Status: DC

## 2015-08-13 MED ORDER — OXYCODONE-ACETAMINOPHEN 5-325 MG PO TABS: 2.0000 | ORAL_TABLET | ORAL | Status: DC | PRN

## 2015-08-13 MED ORDER — LACTATED RINGERS IV SOLN
500.0000 mL | INTRAVENOUS | Status: DC | PRN
  Administered 2015-08-15: 250 mL via INTRAVENOUS
  Administered 2015-08-15: 500 mL via INTRAVENOUS

## 2015-08-13 MED ORDER — ACETAMINOPHEN 325 MG PO TABS: 650.0000 mg | ORAL_TABLET | ORAL | Status: DC | PRN

## 2015-08-13 MED ORDER — OXYCODONE-ACETAMINOPHEN 5-325 MG PO TABS: 1.0000 | ORAL_TABLET | ORAL | Status: DC | PRN

## 2015-08-13 NOTE — Progress Notes (Signed)
Subjective:  Betty Shelton is a 29 y.o. G3P1011 at 9387w0d being seen today for ongoing prenatal care.  Patient reports feeling stressed out today. Declines headache or visual changes.    Contractions: Not present.  Vag. Bleeding: None. Movement: Present. Denies leaking of fluid.   The following portions of the patient's history were reviewed and updated as appropriate: allergies, current medications, past family history, past medical history, past social history, past surgical history and problem list. Problem list updated.  Objective:   Filed Vitals:   08/13/15 1534  BP: 142/87  Pulse: 82  Weight: 311 lb (141.069 kg)    Fetal Status: Fetal Heart Rate (bpm): 143 Fundal Height: 43 cm Movement: Present  Presentation: Vertex  General:  Alert, oriented and cooperative. Patient is in no acute distress.  Skin: Skin is warm and dry. No rash noted.   Cardiovascular: Normal heart rate noted  Respiratory: Normal respiratory effort, no problems with respiration noted  Abdomen: Soft, gravid, appropriate for gestational age. Pain/Pressure: Present     Pelvic: Vag. Bleeding: None Vag D/C Character: Thin  Cervical exam performed Dilation: Closed Effacement (%): 50 Station: -3 Soft  Extremities: Normal range of motion.  Edema: Trace  Mental Status: Normal mood and affect. Normal behavior. Normal judgment and thought content.   Urinalysis: Urine Protein: Trace Urine Glucose: Negative 08/13/15 5487w0d EFW 3751g (8+4)/>90%, AC 93%, cephalic, AFI 8.59 cm  Assessment and Plan:  Pregnancy: G3P1011 at 9087w0d  1. Gestational hypertension, antepartum Patient has second episode of SBP above 140; meets criteria for GHTN.  Delivery indicated. NST performed today was reviewed and was found to be reactive.   Patient will go for IOL tonight; L&D RN in charge and Dr. Adrian BlackwaterStinson (OB on call) notified. Patient aware that this will be a long process given unfavorable cervix; she still desires TOLAC for now.   Fetus is 8  lb 4 oz as of growth scan today.    2. Fetal macrosomia during pregnancy in third trimester, not applicable or unspecified fetus 08/13/15 5787w0d EFW 3751g (8-4)/>90%, AC 93%, cephalic, AFI 8.59 cm  Patient will go to L&D for IOL tonight.  Tereso NewcomerUgonna A Brittney Caraway, MD

## 2015-08-14 LAB — COMPREHENSIVE METABOLIC PANEL
ALBUMIN: 3 g/dL — AB (ref 3.5–5.0)
ALK PHOS: 128 U/L — AB (ref 38–126)
ALT: 8 U/L — ABNORMAL LOW (ref 14–54)
ANION GAP: 5 (ref 5–15)
AST: 13 U/L — ABNORMAL LOW (ref 15–41)
BILIRUBIN TOTAL: 0.4 mg/dL (ref 0.3–1.2)
BUN: 7 mg/dL (ref 6–20)
CALCIUM: 8.5 mg/dL — AB (ref 8.9–10.3)
CO2: 21 mmol/L — ABNORMAL LOW (ref 22–32)
Chloride: 109 mmol/L (ref 101–111)
Creatinine, Ser: 0.64 mg/dL (ref 0.44–1.00)
GFR calc Af Amer: >60 mL/min (ref >60)
GLUCOSE: 90 mg/dL (ref 65–99)
POTASSIUM: 3.6 mmol/L (ref 3.5–5.1)
Sodium: 135 mmol/L (ref 135–145)
TOTAL PROTEIN: 6.4 g/dL — AB (ref 6.5–8.1)

## 2015-08-14 LAB — PROTEIN / CREATININE RATIO, URINE
CREATININE, URINE: 254 mg/dL
PROTEIN CREATININE RATIO: 0.12 mg/mg{creat} (ref 0.00–0.15)
Total Protein, Urine: 31 mg/dL

## 2015-08-14 LAB — ABO/RH: ABO/RH(D): O POS

## 2015-08-14 LAB — TYPE AND SCREEN
ABO/RH(D): O POS
ANTIBODY SCREEN: NEGATIVE

## 2015-08-14 LAB — HIV ANTIBODY (ROUTINE TESTING W REFLEX): HIV Screen 4th Generation wRfx: NONREACTIVE

## 2015-08-14 MED ORDER — TERBUTALINE SULFATE 1 MG/ML IJ SOLN: 0.2500 mg | Freq: Once | INTRAMUSCULAR | Status: DC | PRN

## 2015-08-14 MED ORDER — PROMETHAZINE HCL 25 MG/ML IJ SOLN: 12.5000 mg | Freq: Four times a day (QID) | INTRAMUSCULAR | Status: DC | PRN

## 2015-08-14 MED ORDER — TERBUTALINE SULFATE 1 MG/ML IJ SOLN
0.2500 mg | Freq: Once | INTRAMUSCULAR | Status: DC | PRN
Start: 2015-08-14 — End: 2015-08-14

## 2015-08-14 MED ORDER — OXYTOCIN 40 UNITS IN LACTATED RINGERS INFUSION - SIMPLE MED
1.0000 m[IU]/min | INTRAVENOUS | Status: DC
  Administered 2015-08-14: 2 m[IU]/min via INTRAVENOUS
  Administered 2015-08-15: 10 m[IU]/min via INTRAVENOUS
  Administered 2015-08-15: 8 m[IU]/min via INTRAVENOUS

## 2015-08-14 MED ORDER — OXYTOCIN 40 UNITS IN LACTATED RINGERS INFUSION - SIMPLE MED
1.0000 m[IU]/min | INTRAVENOUS | Status: DC
  Administered 2015-08-14: 2 m[IU]/min via INTRAVENOUS
  Administered 2015-08-15: 14 m[IU]/min via INTRAVENOUS
  Filled 2015-08-14: qty 1000

## 2015-08-14 MED ORDER — FENTANYL CITRATE (PF) 100 MCG/2ML IJ SOLN
100.0000 ug | INTRAMUSCULAR | Status: DC | PRN
  Administered 2015-08-15 (×3): 100 ug via INTRAVENOUS
  Filled 2015-08-14 (×3): qty 2

## 2015-08-14 NOTE — Progress Notes (Signed)
Betty Shelton is a 29 y.o. G3P1011 at 1939w1d admitted for induction of labor due to Gestational Hypertension.  Subjective: Patient is comfortable.  Has been on low dose pitocin for cervical ripening s/p failed attempt of foley placement during admission.  Objective: BP 129/66 mmHg  Pulse 70  Temp(Src) 98.1 F (36.7 C) (Oral)  Resp 18  Ht 5\' 5"  (1.651 m)  Wt 311 lb (141.069 kg)  BMI 51.75 kg/m2  LMP 11/16/2014 (Approximate)     FHT:  FHR: 125 bpm, variability: moderate,  accelerations:  Present,  decelerations:  Absent UC:   irregular, every 3-6 minutes SVE:   Dilation: Fingertip Effacement (%): 50 Station: -3 Exam by:: Alvester MorinNewton, MD  Foley placed with stylet successfully and filled with 60 ml of saline.  Labs: Lab Results  Component Value Date   WBC 9.9 08/13/2015   HGB 11.9* 08/13/2015   HCT 34.2* 08/13/2015   MCV 86.4 08/13/2015   PLT 200 08/13/2015    Assessment / Plan: [facility]    Labor: s/p foley bulb placement. Continue pitocin per protocol. GHTN:  no signs or symptoms of preeclampsia for now Fetal Wellbeing:  Category I Pain Control:  Labor support without medications I/D:  GBS neg Anticipated MOD:  Patient is undergoing TOLAC  Treston Coker A, MD 08/14/2015, 4:51 PM

## 2015-08-14 NOTE — Anesthesia Preprocedure Evaluation (Addendum)
Anesthesia Evaluation  Patient identified by MRN, date of birth, ID band Patient awake    Reviewed: Allergy & Precautions, NPO status , Patient's Chart, lab work & pertinent test results  History of Anesthesia Complications Negative for: history of anesthetic complications  Airway Mallampati: III  TM Distance: >3 FB Neck ROM: Full    Dental  (+) Teeth Intact, Dental Advisory Given   Pulmonary Current Smoker,    Pulmonary exam normal breath sounds clear to auscultation       Cardiovascular hypertension (PIH), negative cardio ROS Normal cardiovascular exam Rhythm:Regular Rate:Normal     Neuro/Psych negative neurological ROS  negative psych ROS   GI/Hepatic negative GI ROS, Neg liver ROS,   Endo/Other  Morbid obesity  Renal/GU negative Renal ROS  negative genitourinary   Musculoskeletal negative musculoskeletal ROS (+)   Abdominal   Peds  Hematology  (+) Blood dyscrasia, anemia ,   Anesthesia Other Findings Day of surgery medications reviewed with the patient.  Reproductive/Obstetrics (+) Pregnancy                           Lab Results  Component Value Date   WBC 9.9 08/13/2015   HGB 11.9* 08/13/2015   HCT 34.2* 08/13/2015   MCV 86.4 08/13/2015   PLT 200 08/13/2015   Lab Results  Component Value Date   CREATININE 0.64 08/13/2015   BUN 7 08/13/2015   NA 135 08/13/2015   K 3.6 08/13/2015   CL 109 08/13/2015   CO2 21* 08/13/2015   No results found for: INR, PROTIME   Anesthesia Physical Anesthesia Plan  ASA: III  Anesthesia Plan: Epidural   Post-op Pain Management:    Induction:   Airway Management Planned:   Additional Equipment:   Intra-op Plan:   Post-operative Plan:   Informed Consent: I have reviewed the patients History and Physical, chart, labs and discussed the procedure including the risks, benefits and alternatives for the proposed anesthesia with the  patient or authorized representative who has indicated his/her understanding and acceptance.   Dental advisory given  Plan Discussed with: CRNA  Anesthesia Plan Comments: (Patient identified. Risks/Benefits/Options discussed with patient including but not limited to bleeding, infection, nerve damage, paralysis, failed block, incomplete pain control, headache, blood pressure changes, nausea, vomiting, reactions to medication both or allergic, itching and postpartum back pain. Confirmed with bedside nurse the patient's most recent platelet count. Confirmed with patient that they are not currently taking any anticoagulation, have any bleeding history or any family history of bleeding disorders. Patient expressed understanding and wished to proceed. All questions were answered. )       Anesthesia Quick Evaluation

## 2015-08-14 NOTE — H&P (Signed)
Betty Shelton is a 29 y.o. female presenting for induction of labor for gestational hypertension. She was seen for her routine visit today and had a second elevated blood pressure.   Dr Macon LargeAnyanwu Note: 1. Gestational hypertension, antepartum Patient has second episode of SBP above 140; meets criteria for GHTN. Delivery indicated. NST performed today was reviewed and was found to be reactive.  Patient will go for IOL tonight; L&D RN in charge and Dr. Adrian BlackwaterStinson (OB on call) notified. Patient aware that this will be a long process given unfavorable cervix; she still desires TOLAC for now.  Fetus is 8 lb 4 oz as of growth scan today.  2. Fetal macrosomia during pregnancy in third trimester, not applicable or unspecified fetus 08/13/15 1778w0d EFW 3751g (8-4)/>90%, AC 93%, cephalic, AFI 8.59 cm  Maternal Medical History:  Reason for admission: Nausea. Induction of labor  Contractions: Frequency: rare.   Perceived severity is mild.    Fetal activity: Perceived fetal activity is normal.   Last perceived fetal movement was within the past hour.    Prenatal complications: PIH.   No bleeding, HIV, preterm labor, substance abuse or thrombocytopenia.   Prenatal Complications - Diabetes: none.    OB History    Gravida Para Term Preterm AB TAB SAB Ectopic Multiple Living   3 1 1  0 1 0 1 0 0 1     Past Medical History  Diagnosis Date  . Morbid obesity with BMI of 50.0-59.9, adult (HCC)   . Medical history non-contributory    Past Surgical History  Procedure Laterality Date  . Cesarean section    . Tooth extraction  2014  . Wisdom tooth extraction    . Iud removal N/A 11/28/2013    Procedure: INTRAUTERINE DEVICE (IUD) REMOVAL;  Surgeon: Allie BossierMyra C Dove, MD;  Location: [facility];  Service: Gynecology;  Laterality: N/A;   Family History: family history includes Cancer in her maternal aunt; Diabetes in her maternal grandmother and mother; Hypertension in her maternal grandmother. Social  History:  reports that she has been smoking Cigarettes.  She has a 2 pack-year smoking history. She has never used smokeless tobacco. She reports that she does not drink alcohol or use illicit drugs.   Prenatal Transfer Tool  Maternal Diabetes: No Genetic Screening: Normal Maternal Ultrasounds/Referrals: Normal Fetal Ultrasounds or other Referrals:  None Maternal Substance Abuse:  No Significant Maternal Medications:  None Significant Maternal Lab Results:  None Other Comments:  Gestational Hypertension, labs pending  Review of Systems  Constitutional: Negative for fever, chills and malaise/fatigue.  Eyes: Negative for blurred vision and double vision.  Respiratory: Negative for shortness of breath.   Cardiovascular: Negative for chest pain.  Gastrointestinal: Negative for nausea, vomiting, abdominal pain, diarrhea and constipation.  Musculoskeletal: Negative for back pain.  Neurological: Negative for dizziness, weakness and headaches.    Dilation: Fingertip Effacement (%): 50 Station: -3 Exam by:: Artelia LarocheM. Williams, CNM  Blood pressure 123/65, pulse 72, temperature 97.8 F (36.6 C), temperature source Oral, resp. rate 18, height 5\' 5"  (1.651 m), weight 141.069 kg (311 lb), last menstrual period 11/16/2014. Maternal Exam:  Uterine Assessment: Contraction strength is mild.  Contraction frequency is rare.   Abdomen: Patient reports no abdominal tenderness. Surgical scars: low transverse.   Fundal height is 42.   Estimated fetal weight is 8.5.   Fetal presentation: vertex  Introitus: Normal vulva. Normal vagina.  Vagina is negative for discharge.  Ferning test: not done.  Nitrazine test: not done. Amniotic fluid  character: not assessed.  Pelvis: questionable for delivery.   Cervix: Cervix evaluated by digital exam.     Fetal Exam Fetal Monitor Review: Mode: ultrasound.   Baseline rate: 120-130.  Variability: moderate (6-25 bpm).   Pattern: accelerations present and no  decelerations.    Fetal State Assessment: Category I - tracings are normal.     Physical Exam  Constitutional: She is oriented to person, place, and time. She appears well-developed and well-nourished. No distress.  HENT:  Head: Normocephalic.  Neck: Normal range of motion. Neck supple.  Cardiovascular: Normal rate, regular rhythm and normal heart sounds.  Exam reveals no gallop and no friction rub.   No murmur heard. Respiratory: Effort normal and breath sounds normal. No respiratory distress. She has no wheezes. She has no rales. She exhibits no tenderness.  GI: Soft. She exhibits no distension. There is no tenderness. There is no rebound and no guarding.  Genitourinary: Vagina normal. No vaginal discharge found.  Dilation: Fingertip Effacement (%): 50 Cervical Position: Posterior Station: -3 Presentation: Vertex Exam by:: Artelia Laroche, CNM    Musculoskeletal: Normal range of motion. She exhibits edema (trace to 1+).  Neurological: She is alert and oriented to person, place, and time. She has normal reflexes. She displays normal reflexes. She exhibits normal muscle tone (no clonus).  Skin: Skin is warm and dry.  Psychiatric: She has a normal mood and affect.    Prenatal labs: ABO, Rh: O/POS/-- (04/05 1406) Antibody: NEG (04/05 1406) Rubella: 8.54 (04/05 1406) RPR: NON REAC (08/08 1318)  HBsAg: NEGATIVE (04/05 1406)  HIV: NONREACTIVE (08/08 1318)  GBS: Negative (10/11 0000)   Assessment/Plan: A:  SIUP at [redacted]w[redacted]d       Gestational hypertension with no evidence of preeclampsia      Unfavorable cervix  P;  Admit to Birthing Suites      Routine admit orders      Attempted placement of Cook catheter without success. Able to get into external os, but would not thread through internal os, even with stylet      Will use low dose Pitocin at 24mu/min   [facility] 08/14/2015, 12:39 AM

## 2015-08-14 NOTE — Plan of Care (Signed)
Problem: Consults Goal: Birthing Suites Patient Information Press F2 to bring up selections list  Outcome: Completed/Met Date Met:  08/14/15  Inpatient induction

## 2015-08-14 NOTE — Progress Notes (Addendum)
Patient ID: Betty Shelton, female   DOB: Aug 13, 1986, 29 y.o.   MRN: [id number] Doing well  Filed Vitals:   08/14/15 1734 08/14/15 1832 08/14/15 2005 08/14/15 2123  BP: 111/69 129/70 105/87 117/86  Pulse: 63 68 69 71  Temp: 97.5 F (36.4 C)  98 F (36.7 C) 97.8 F (36.6 C)  TempSrc: Oral  Oral Oral  Resp:  16 18 18   Height:      Weight:       Fetal heart rate reassuring Uterine cramps not tracing well  States they feel like menstrual cramps  Will leave Pitocin at 396mu/min  Anticipate increased progress  Dr Penne LashLeggett updated

## 2015-08-15 ENCOUNTER — Inpatient Hospital Stay (HOSPITAL_COMMUNITY): Payer: BLUE CROSS/BLUE SHIELD | Admitting: Anesthesiology

## 2015-08-15 LAB — CBC
HCT: 35.6 % — ABNORMAL LOW (ref 36.0–46.0)
Hemoglobin: 11.8 g/dL — ABNORMAL LOW (ref 12.0–15.0)
MCH: 28.9 pg (ref 26.0–34.0)
MCHC: 33.1 g/dL (ref 30.0–36.0)
MCV: 87 fL (ref 78.0–100.0)
PLATELETS: 196 10*3/uL (ref 150–400)
RBC: 4.09 MIL/uL (ref 3.87–5.11)
RDW: 13.9 % (ref 11.5–15.5)
WBC: 7.6 10*3/uL (ref 4.0–10.5)

## 2015-08-15 LAB — RPR: RPR: NONREACTIVE

## 2015-08-15 MED ORDER — FENTANYL 2.5 MCG/ML BUPIVACAINE 1/10 % EPIDURAL INFUSION (WH - ANES)
14.0000 mL/h | INTRAMUSCULAR | Status: DC | PRN
  Administered 2015-08-15: 14 mL/h via EPIDURAL
  Filled 2015-08-15 (×2): qty 125

## 2015-08-15 MED ORDER — EPHEDRINE 5 MG/ML INJ
10.0000 mg | INTRAVENOUS | Status: AC | PRN
  Administered 2015-08-15 – 2015-08-16 (×2): 10 mg via INTRAVENOUS
  Filled 2015-08-15: qty 4

## 2015-08-15 MED ORDER — DIPHENHYDRAMINE HCL 50 MG/ML IJ SOLN: 12.5000 mg | INTRAMUSCULAR | Status: DC | PRN

## 2015-08-15 MED ORDER — LIDOCAINE HCL (PF) 1 % IJ SOLN
INTRAMUSCULAR | Status: DC | PRN
  Administered 2015-08-15: 5 mL via EPIDURAL
  Administered 2015-08-15: 2 mL via EPIDURAL
  Administered 2015-08-15: 3 mL via EPIDURAL

## 2015-08-15 MED ORDER — PHENYLEPHRINE 40 MCG/ML (10ML) SYRINGE FOR IV PUSH (FOR BLOOD PRESSURE SUPPORT)
80.0000 ug | PREFILLED_SYRINGE | INTRAVENOUS | Status: DC | PRN
  Administered 2015-08-15: 80 ug via INTRAVENOUS
  Filled 2015-08-15: qty 20

## 2015-08-15 MED ORDER — LACTATED RINGERS IV SOLN
INTRAVENOUS | Status: DC
  Administered 2015-08-15: 22:00:00 via INTRAUTERINE

## 2015-08-15 NOTE — Progress Notes (Signed)
Labor Progress Note Betty Shelton is a 29 y.o. G3P1011 at 3759w2d presented for IOL due to gHTN with history of CS and desires TOLAC S: Reporting menstrual like cramping every 4-6 minutes.    O:  BP 122/77 mmHg  Pulse 75  Temp(Src) 97.7 F (36.5 C) (Oral)  Resp 16  Ht 5\' 5"  (1.651 m)  Wt 311 lb (141.069 kg)  BMI 51.75 kg/m2  LMP 11/16/2014 (Approximate) EFM: 125/mod/+accels, occasional deep variables to 70 with return to baseline and moderate variability throughout.   CVE: Dilation: 5 Effacement (%): 60 Cervical Position: Posterior Station: Ballotable Presentation: Vertex Exam by:: Welford RocheJessica Shelton   A&P: 29 y.o. G3P1011 5959w2d IOL for gHTN #Labor: Difficult to assess ctx pattern 2/2 to habitus. Patient is s/p FB but is still very thick and ballotable. Not a candidate for ROM at this point for internal monitoring given the risk of infection and cord prolapse. As patient can feel ctx, we will increase pitocin slowly and monitor for FHR.  Once cervix is more effaced and fetal head is better applied we can internalize her monitors.  #Pain: prn #FWB: Cat II, position changes for variables.  #GBS: negative  #GHTN: well controlled.    Federico FlakeKimberly Niles [name], MD 1:57 PM

## 2015-08-15 NOTE — Progress Notes (Signed)
Discussed status with Dr. Alvester MorinNewton. Reviewed strip. New orders to go up to 8, then 10 on pit and reassess patient status.

## 2015-08-15 NOTE — Anesthesia Procedure Notes (Signed)
Epidural Patient location during procedure: OB  Staffing Anesthesiologist: Danisha Brassfield EDWARD Performed by: anesthesiologist   Preanesthetic Checklist Completed: patient identified, pre-op evaluation, timeout performed, IV checked, risks and benefits discussed and monitors and equipment checked  Epidural Patient position: sitting Prep: DuraPrep Patient monitoring: blood pressure and continuous pulse ox Approach: midline Location: L3-L4 Injection technique: LOR air  Needle:  Needle type: Tuohy  Needle gauge: 17 G Needle length: 9 cm Needle insertion depth: 8 cm Catheter size: 19 Gauge Catheter at skin depth: 13 cm Test dose: negative and Other (1% Lidocaine)  Additional Notes Patient identified.  Risk benefits discussed including failed block, incomplete pain control, headache, nerve damage, paralysis, blood pressure changes, nausea, vomiting, reactions to medication both toxic or allergic, and postpartum back pain.  Patient expressed understanding and wished to proceed.  All questions were answered.  Sterile technique used throughout procedure and epidural site dressed with sterile barrier dressing. No paresthesia or other complications noted. The patient did not experience any signs of intravascular injection such as tinnitus or metallic taste in mouth nor signs of intrathecal spread such as rapid motor block. Please see nursing notes for vital signs. Reason for block:procedure for pain   

## 2015-08-15 NOTE — Progress Notes (Addendum)
Labor Progress Note Patient ID: Betty Shelton, female   DOB: 03/01/1986, 29 y.o.   MRN: [id number]  Betty Shelton is a 29 y.o. G3P1011 at 5040w2d presented for IOL for gHTN  S: Patient is feeling increased intensity of contractions.   O:  BP 115/77 mmHg  Pulse 68  Temp(Src) 97.6 F (36.4 C) (Axillary)  Resp 18  Ht 5\' 5"  (1.651 m)  Wt 311 lb (141.069 kg)  BMI 51.75 kg/m2  SpO2 100%  LMP 11/16/2014 (Approximate) EFM: 145/mod/accels, no decels  CVE: Dilation: 5 Effacement (%): 50 Cervical Position: Posterior Station: -3 Presentation: Vertex Exam by:: Dr. Emelda FearFerguson  AROM performed with smal amount of clear fluid with blood tinging FSE placed IUPC placed  A&P: 29 y.o. G3P1011 7740w2d here for IOl for gHTN #Labor: Increase pitocin now that fetal monitoring and contraction monitoring is reliable.  #Pain: Epidural prn #FWB: Cat I currently, continue to monitor #GBS neg #TOLAC: original VBAC success calculator 25% with cervical change currently 35%. Continue to monitor closely for s/sx of uterine rupture.   Federico FlakeKimberly Niles Kinlie Janice, MD 7:05 PM

## 2015-08-15 NOTE — Progress Notes (Signed)
Patient ID: Betty Shelton, female   DOB: Feb 12, 1986, 29 y.o.   MRN: [id number]  CTSP @ 2148 for FHR variables/decel. Position changes used and O2/ fluid bolus as well. Epidural placed approx 1 hr 15mins ago, and BP now 90s/50s. Phenylephrine and ephedrine used w/ return of BP to 120s/60s and FHR stabilized. Amnioinfusion also started.   Cx now 4-5/70/-3.  FHR 120-130s, +accels, occ mi variables  Pitocin to be restarted @ 2x2. Watch FHR.  Cam HaiSHAW, KIMBERLY 08/15/2015 11:20 PM

## 2015-08-15 NOTE — Progress Notes (Signed)
Patient ID: Betty Shelton, female   DOB: [birth year], 29 y.o.   MRN: [id number] Doing well  Filed Vitals:   08/14/15 2302 08/15/15 0137 08/15/15 0248 08/15/15 0435  BP: 124/74 117/70 112/48 153/71  Pulse: 69 79 72 64  Temp: 97.5 F (36.4 C) 97.6 F (36.4 C)    TempSrc:  Oral    Resp: 18 18 18 18   Height:      Weight:       FHR reactive Category I Baseline 120 with accels to 140-160  Mild contractions every 3-4 min  Dilation: Fingertip Effacement (%): 50 Cervical Position: Posterior Station: -3 Presentation: Vertex Exam by:: Alvester MorinNewton, MD  Foley still in Will leave Pitocin low dose and observe until Foley falls out

## 2015-08-16 ENCOUNTER — Encounter (HOSPITAL_COMMUNITY): Payer: Self-pay

## 2015-08-16 ENCOUNTER — Encounter (HOSPITAL_COMMUNITY)
Admission: AD | Disposition: A | Discharge status: Home / Self Care | Payer: Self-pay | Source: Ambulatory Visit | Attending: Obstetrics and Gynecology

## 2015-08-16 DIAGNOSIS — O3663X0 Maternal care for excessive fetal growth, third trimester, not applicable or unspecified: Secondary | ICD-10-CM

## 2015-08-16 DIAGNOSIS — Z3A39 39 weeks gestation of pregnancy: Secondary | ICD-10-CM

## 2015-08-16 DIAGNOSIS — O134 Gestational [pregnancy-induced] hypertension without significant proteinuria, complicating childbirth: Secondary | ICD-10-CM

## 2015-08-16 SURGERY — Surgical Case
Anesthesia: Epidural | Site: Abdomen

## 2015-08-16 MED ORDER — PRENATAL MULTIVITAMIN CH
1.0000 | ORAL_TABLET | Freq: Every day | ORAL | Status: DC
  Administered 2015-08-16 – 2015-08-17 (×2): 1 via ORAL
  Filled 2015-08-16 (×2): qty 1

## 2015-08-16 MED ORDER — MORPHINE SULFATE (PF) 0.5 MG/ML IJ SOLN
INTRAMUSCULAR | Status: DC | PRN
  Administered 2015-08-16: 4 mg via EPIDURAL

## 2015-08-16 MED ORDER — IBUPROFEN 600 MG PO TABS
600.0000 mg | ORAL_TABLET | Freq: Four times a day (QID) | ORAL | Status: DC
  Administered 2015-08-16 – 2015-08-18 (×9): 600 mg via ORAL
  Filled 2015-08-16 (×10): qty 1

## 2015-08-16 MED ORDER — SODIUM BICARBONATE 8.4 % IV SOLN
INTRAVENOUS | Status: DC | PRN
  Administered 2015-08-16 (×3): 5 mL via EPIDURAL

## 2015-08-16 MED ORDER — DEXTROSE 5 % IV SOLN
3.0000 g | INTRAVENOUS | Status: DC | PRN
  Administered 2015-08-16: 3 g via INTRAVENOUS

## 2015-08-16 MED ORDER — LANOLIN HYDROUS EX OINT: 1.0000 "application " | TOPICAL_OINTMENT | CUTANEOUS | Status: DC | PRN

## 2015-08-16 MED ORDER — NALOXONE HCL 0.4 MG/ML IJ SOLN: 0.4000 mg | INTRAMUSCULAR | Status: DC | PRN

## 2015-08-16 MED ORDER — LACTATED RINGERS IV SOLN
INTRAVENOUS | Status: DC | PRN
  Administered 2015-08-16 (×2): via INTRAVENOUS

## 2015-08-16 MED ORDER — FENTANYL CITRATE (PF) 100 MCG/2ML IJ SOLN: 25.0000 ug | INTRAMUSCULAR | Status: DC | PRN

## 2015-08-16 MED ORDER — LACTATED RINGERS IV SOLN
INTRAVENOUS | Status: DC
Start: 2015-08-16 — End: 2015-08-16

## 2015-08-16 MED ORDER — SCOPOLAMINE 1 MG/3DAYS TD PT72
1.0000 | MEDICATED_PATCH | Freq: Once | TRANSDERMAL | Status: DC
  Filled 2015-08-16: qty 1

## 2015-08-16 MED ORDER — OXYTOCIN 10 UNIT/ML IJ SOLN
INTRAMUSCULAR | Status: AC
  Filled 2015-08-16: qty 4

## 2015-08-16 MED ORDER — NALBUPHINE HCL 10 MG/ML IJ SOLN: 5.0000 mg | Freq: Once | INTRAMUSCULAR | Status: DC | PRN

## 2015-08-16 MED ORDER — SODIUM CHLORIDE 0.9 % IJ SOLN: 3.0000 mL | INTRAMUSCULAR | Status: DC | PRN

## 2015-08-16 MED ORDER — TETANUS-DIPHTH-ACELL PERTUSSIS 5-2.5-18.5 LF-MCG/0.5 IM SUSP: 0.5000 mL | Freq: Once | INTRAMUSCULAR | Status: DC

## 2015-08-16 MED ORDER — SCOPOLAMINE 1 MG/3DAYS TD PT72
MEDICATED_PATCH | TRANSDERMAL | Status: AC
  Filled 2015-08-16: qty 1

## 2015-08-16 MED ORDER — MEPERIDINE HCL 25 MG/ML IJ SOLN
INTRAMUSCULAR | Status: DC | PRN
  Administered 2015-08-16 (×2): 12.5 mg via INTRAVENOUS

## 2015-08-16 MED ORDER — KETOROLAC TROMETHAMINE 30 MG/ML IJ SOLN: 30.0000 mg | Freq: Four times a day (QID) | INTRAMUSCULAR | Status: AC | PRN

## 2015-08-16 MED ORDER — ONDANSETRON HCL 4 MG/2ML IJ SOLN
INTRAMUSCULAR | Status: AC
  Filled 2015-08-16: qty 2

## 2015-08-16 MED ORDER — SENNOSIDES-DOCUSATE SODIUM 8.6-50 MG PO TABS
2.0000 | ORAL_TABLET | ORAL | Status: DC
  Administered 2015-08-17 – 2015-08-18 (×2): 2 via ORAL
  Filled 2015-08-16 (×2): qty 2

## 2015-08-16 MED ORDER — NALBUPHINE HCL 10 MG/ML IJ SOLN: 5.0000 mg | INTRAMUSCULAR | Status: DC | PRN

## 2015-08-16 MED ORDER — SODIUM BICARBONATE 8.4 % IV SOLN
INTRAVENOUS | Status: AC
  Filled 2015-08-16: qty 50

## 2015-08-16 MED ORDER — MEPERIDINE HCL 25 MG/ML IJ SOLN
INTRAMUSCULAR | Status: AC
  Filled 2015-08-16: qty 1

## 2015-08-16 MED ORDER — LACTATED RINGERS IV SOLN: INTRAVENOUS | Status: DC

## 2015-08-16 MED ORDER — MENTHOL 3 MG MT LOZG: 1.0000 | LOZENGE | OROMUCOSAL | Status: DC | PRN

## 2015-08-16 MED ORDER — DIPHENHYDRAMINE HCL 25 MG PO CAPS: 25.0000 mg | ORAL_CAPSULE | ORAL | Status: DC | PRN

## 2015-08-16 MED ORDER — ONDANSETRON HCL 4 MG/2ML IJ SOLN: 4.0000 mg | Freq: Three times a day (TID) | INTRAMUSCULAR | Status: DC | PRN

## 2015-08-16 MED ORDER — WITCH HAZEL-GLYCERIN EX PADS: 1.0000 "application " | MEDICATED_PAD | CUTANEOUS | Status: DC | PRN

## 2015-08-16 MED ORDER — OXYCODONE-ACETAMINOPHEN 5-325 MG PO TABS
1.0000 | ORAL_TABLET | ORAL | Status: DC | PRN
Start: 2015-08-16 — End: 2015-08-18

## 2015-08-16 MED ORDER — ONDANSETRON HCL 4 MG/2ML IJ SOLN
INTRAMUSCULAR | Status: DC | PRN
  Administered 2015-08-16: 4 mg via INTRAVENOUS

## 2015-08-16 MED ORDER — MEPERIDINE HCL 25 MG/ML IJ SOLN: 6.2500 mg | INTRAMUSCULAR | Status: DC | PRN

## 2015-08-16 MED ORDER — SIMETHICONE 80 MG PO CHEW: 80.0000 mg | CHEWABLE_TABLET | ORAL | Status: DC | PRN

## 2015-08-16 MED ORDER — OXYCODONE-ACETAMINOPHEN 5-325 MG PO TABS: 2.0000 | ORAL_TABLET | ORAL | Status: DC | PRN

## 2015-08-16 MED ORDER — LIDOCAINE-EPINEPHRINE (PF) 2 %-1:200000 IJ SOLN
INTRAMUSCULAR | Status: AC
  Filled 2015-08-16: qty 20

## 2015-08-16 MED ORDER — ZOLPIDEM TARTRATE 5 MG PO TABS: 5.0000 mg | ORAL_TABLET | Freq: Every evening | ORAL | Status: DC | PRN

## 2015-08-16 MED ORDER — PHENYLEPHRINE 40 MCG/ML (10ML) SYRINGE FOR IV PUSH (FOR BLOOD PRESSURE SUPPORT)
PREFILLED_SYRINGE | INTRAVENOUS | Status: AC
  Filled 2015-08-16: qty 10

## 2015-08-16 MED ORDER — DIPHENHYDRAMINE HCL 25 MG PO CAPS: 25.0000 mg | ORAL_CAPSULE | Freq: Four times a day (QID) | ORAL | Status: DC | PRN

## 2015-08-16 MED ORDER — ACETAMINOPHEN 325 MG PO TABS: 650.0000 mg | ORAL_TABLET | ORAL | Status: DC | PRN

## 2015-08-16 MED ORDER — SIMETHICONE 80 MG PO CHEW
80.0000 mg | CHEWABLE_TABLET | Freq: Three times a day (TID) | ORAL | Status: DC
  Administered 2015-08-16 – 2015-08-18 (×7): 80 mg via ORAL
  Filled 2015-08-16 (×7): qty 1

## 2015-08-16 MED ORDER — DIPHENHYDRAMINE HCL 50 MG/ML IJ SOLN: 12.5000 mg | INTRAMUSCULAR | Status: DC | PRN

## 2015-08-16 MED ORDER — OXYTOCIN 40 UNITS IN LACTATED RINGERS INFUSION - SIMPLE MED: 62.5000 mL/h | INTRAVENOUS | Status: AC

## 2015-08-16 MED ORDER — MORPHINE SULFATE (PF) 0.5 MG/ML IJ SOLN
INTRAMUSCULAR | Status: AC
  Filled 2015-08-16: qty 100

## 2015-08-16 MED ORDER — SIMETHICONE 80 MG PO CHEW
80.0000 mg | CHEWABLE_TABLET | ORAL | Status: DC
  Administered 2015-08-17 (×2): 80 mg via ORAL
  Filled 2015-08-16 (×2): qty 1

## 2015-08-16 MED ORDER — OXYTOCIN 10 UNIT/ML IJ SOLN
40.0000 [IU] | INTRAMUSCULAR | Status: DC | PRN
  Administered 2015-08-16: 40 [IU] via INTRAVENOUS

## 2015-08-16 MED ORDER — NALOXONE HCL 2 MG/2ML IJ SOSY
1.0000 ug/kg/h | PREFILLED_SYRINGE | INTRAMUSCULAR | Status: DC | PRN
  Filled 2015-08-16: qty 2

## 2015-08-16 MED ORDER — SCOPOLAMINE 1 MG/3DAYS TD PT72
MEDICATED_PATCH | TRANSDERMAL | Status: DC | PRN
  Administered 2015-08-16: 1 via TRANSDERMAL

## 2015-08-16 MED ORDER — DIBUCAINE 1 % RE OINT: 1.0000 "application " | TOPICAL_OINTMENT | RECTAL | Status: DC | PRN

## 2015-08-16 SURGICAL SUPPLY — 33 items
BENZOIN TINCTURE PRP APPL 2/3 (GAUZE/BANDAGES/DRESSINGS) ×3 IMPLANT
CLAMP CORD UMBIL (MISCELLANEOUS) IMPLANT
CLOSURE WOUND 1/2 X4 (GAUZE/BANDAGES/DRESSINGS) ×1
CLOTH BEACON ORANGE TIMEOUT ST (SAFETY) ×3 IMPLANT
DRAPE SHEET LG 3/4 BI-LAMINATE (DRAPES) IMPLANT
DRSG OPSITE POSTOP 4X10 (GAUZE/BANDAGES/DRESSINGS) ×3 IMPLANT
DURAPREP 26ML APPLICATOR (WOUND CARE) ×3 IMPLANT
ELECT REM PT RETURN 9FT ADLT (ELECTROSURGICAL) ×3
ELECTRODE REM PT RTRN 9FT ADLT (ELECTROSURGICAL) ×1 IMPLANT
EXTRACTOR VACUUM KIWI (MISCELLANEOUS) IMPLANT
GLOVE BIO SURGEON ST LM GN SZ9 (GLOVE) ×3 IMPLANT
GLOVE BIOGEL PI IND STRL 9 (GLOVE) ×1 IMPLANT
GLOVE BIOGEL PI INDICATOR 9 (GLOVE) ×2
GOWN STRL REUS W/TWL 2XL LVL3 (GOWN DISPOSABLE) ×3 IMPLANT
GOWN STRL REUS W/TWL LRG LVL3 (GOWN DISPOSABLE) ×3 IMPLANT
NEEDLE HYPO 25X5/8 SAFETYGLIDE (NEEDLE) IMPLANT
NS IRRIG 1000ML POUR BTL (IV SOLUTION) ×3 IMPLANT
PACK C SECTION WH (CUSTOM PROCEDURE TRAY) ×3 IMPLANT
PAD OB MATERNITY 4.3X12.25 (PERSONAL CARE ITEMS) ×3 IMPLANT
PENCIL SMOKE EVAC W/HOLSTER (ELECTROSURGICAL) IMPLANT
RTRCTR C-SECT PINK 25CM LRG (MISCELLANEOUS) IMPLANT
RTRCTR C-SECT PINK 34CM XLRG (MISCELLANEOUS) IMPLANT
STRIP CLOSURE SKIN 1/2X4 (GAUZE/BANDAGES/DRESSINGS) ×2 IMPLANT
SUT MNCRL 0 VIOLET CTX 36 (SUTURE) ×2 IMPLANT
SUT MONOCRYL 0 CTX 36 (SUTURE) ×4
SUT VIC AB 0 CT1 27 (SUTURE) ×2
SUT VIC AB 0 CT1 27XBRD ANBCTR (SUTURE) ×1 IMPLANT
SUT VIC AB 2-0 CT1 27 (SUTURE) ×2
SUT VIC AB 2-0 CT1 TAPERPNT 27 (SUTURE) ×1 IMPLANT
SUT VIC AB 4-0 KS 27 (SUTURE) ×3 IMPLANT
SYR BULB IRRIGATION 50ML (SYRINGE) IMPLANT
TOWEL OR 17X24 6PK STRL BLUE (TOWEL DISPOSABLE) ×3 IMPLANT
TRAY FOLEY CATH SILVER 14FR (SET/KITS/TRAYS/PACK) ×3 IMPLANT

## 2015-08-16 NOTE — Anesthesia Postprocedure Evaluation (Signed)
  Anesthesia Post-op Note  Patient: Betty Shelton  Procedure(s) Performed: Procedure(s) (LRB): CESAREAN SECTION (N/A)  Patient Location: PACU  Anesthesia Type: Epidural  Level of Consciousness: awake and alert   Airway and Oxygen Therapy: Patient Spontanous Breathing  Post-op Pain: mild  Post-op Assessment: Post-op Vital signs reviewed, Patient's Cardiovascular Status Stable, Respiratory Function Stable, Patent Airway and No signs of Nausea or vomiting  Last Vitals:  Filed Vitals:   08/16/15 0445  BP: 134/79  Pulse: 77  Temp:   Resp: 16    Post-op Vital Signs: stable   Complications: No apparent anesthesia complications

## 2015-08-16 NOTE — Transfer of Care (Addendum)
Immediate Anesthesia Transfer of Care Note  Patient: Betty Shelton  Procedure(s) Performed: Procedure(s): CESAREAN SECTION (N/A)  Patient Location: PACU  Anesthesia Type:Epidural  Level of Consciousness: awake, alert , oriented and patient cooperative  Airway & Oxygen Therapy: Patient Spontanous Breathing  Post-op Assessment: Report given to RN and Post -op Vital signs reviewed and stable  Post vital signs: Reviewed and stable  Last Vitals:  Temp 98.0 BP 143/76 HR 98 RR 18 POX 100  Complications:

## 2015-08-16 NOTE — Progress Notes (Signed)
Betty Shelton is a 29 y.o. G3P1011 at 7062w3d  admitted for induction of labor [facility] Recent decel due to baby response to vomiting, with prolonged decel, and recovery.  Subjective:   Objective: BP 142/81 mmHg  Pulse 75  Temp(Src) 97.3 F (36.3 C) (Axillary)  Resp 18  Ht 5\' 5"  (1.651 m)  Wt 311 lb (141.069 kg)  BMI 51.75 kg/m2  SpO2 100%  LMP 11/16/2014 (Approximate)   Total I/O In: -  Out: 400 [Urine:400]  FHT:  FHR: 145 bpm, variability: moderate,  accelerations:  Present,  decelerations:  Present prolonged decel, recovered UC:   regular, every 3 minutes SVE:   Dilation: 5 Effacement (%): 80 Station: -2 Exam by:: Dr. Mauricio PoFergerson Pt now 5.90/-2-3, with some moulding present but No descent by same examiner over 3 additional hours (JVF).  Labs: Lab Results  Component Value Date   WBC 7.6 08/15/2015   HGB 11.8* 08/15/2015   HCT 35.6* 08/15/2015   MCV 87.0 08/15/2015   PLT 196 08/15/2015    Assessment / Plan: Arrest in active phase of labor  Labor: absence of significant progress in 3 hours.  Declining prognosis for vaginal delivery reviewed with patient, and cesarean delivery recommended and readily accepted by pt.  Risks of procedure, bleeding , transfusion, proxoimity to adjacent organs, bowel and bladder reviewed. Pt will be taken for repeat cesarean for failed TOLAC. Preeclampsia:   Fetal Wellbeing:  Category I Pain Control:  Epidural I/D:  n/a Anticipated MOD:  cesarean section.  Dung Prien V 08/16/2015, 2:47 AM

## 2015-08-16 NOTE — Anesthesia Postprocedure Evaluation (Signed)
  Anesthesia Post-op Note  Patient: Betty Shelton  Procedure(s) Performed: Procedure(s): CESAREAN SECTION (N/A)  Patient Location: Mother/Baby  Anesthesia Type:Epidural  Level of Consciousness: awake, alert  and oriented  Airway and Oxygen Therapy: Patient Spontanous Breathing  Post-op Pain: none  Post-op Assessment: Post-op Vital signs reviewed, Patient's Cardiovascular Status Stable, Respiratory Function Stable, Patent Airway, Adequate PO intake, Pain level controlled, No headache and No backache              Post-op Vital Signs: Reviewed and stable  Last Vitals:  Filed Vitals:   08/16/15 1010  BP: 103/64  Pulse: 65  Temp: 36.9 C  Resp: 20    Complications: No apparent anesthesia complications

## 2015-08-16 NOTE — Addendum Note (Signed)
Addendum  created 08/16/15 1054 by Shanon PayorSuzanne M Friend Dorfman, CRNA   Modules edited: Notes Section   Notes Section:  File: [id number]

## 2015-08-16 NOTE — Lactation Note (Signed)
This note was copied from the chart of Betty Aretha Parrotrica Lotspeich. Lactation Consultation Note  Patient Name: Betty Shelton ZOXWR'UToday's Date: 08/16/2015 Reason for consult: Initial assessment Mom is experienced BF and reports this baby has nursed well after delivery. Encouraged to BF with feeding ques, basic teaching reviewed. Lactation brochure left for review, advised of OP services and support group. Encouraged to call for questions/concerns or assist as needed.   Maternal Data Has patient been taught Hand Expression?: No (Mom reports she knows how to hand express, declined demonstration)  Feeding    LATCH Score/Interventions                      Lactation Tools Discussed/Used     Consult Status Consult Status: Follow-up Date: 08/16/15 Follow-up type: In-patient    Alfred LevinsGranger, Kelty Szafran Ann 08/16/2015, 12:29 PM

## 2015-08-16 NOTE — Brief Op Note (Signed)
08/13/2015 - 08/16/2015  4:03 AM  PATIENT:  Betty Shelton  29 y.o. female  PRE-OPERATIVE DIAGNOSIS:  Failure to Progess  POST-OPERATIVE DIAGNOSIS:  Failure to Progess/cephalopelvic disproportion  PROCEDURE:  Procedure(s): CESAREAN SECTION (N/A)  SURGEON:  Surgeon(s) and Role:    * Tilda BurrowJohn Annali Lybrand V, MD - Primary  PHYSICIAN ASSISTANT:   ASSISTANTS: Jason FilaDu Pisanie, Johannes MSIII [facility]   ANESTHESIA:   epidural  EBL:  Total I/O In: 2300 [I.V.:2300] Out: 1100 [Urine:500; Blood:600]  BLOOD ADMINISTERED:none  DRAINS: Urinary Catheter (Foley)   LOCAL MEDICATIONS USED:  NONE  SPECIMEN:  Source of Specimen:  placenta to L&D  DISPOSITION OF SPECIMEN:  N/A  COUNTS:  YES  TOURNIQUET:  * No tourniquets in log *  DICTATION: .Dragon Dictation  PLAN OF CARE: Admit to inpatient   PATIENT DISPOSITION:  PACU - hemodynamically stable.   Delay start of Pharmacological VTE agent (>24hrs) due to surgical blood loss or risk of bleeding: not applicable

## 2015-08-16 NOTE — Progress Notes (Signed)
Betty Shelton is a 29 y.o. G3P1011 at 627w3d  admitted for induction of labor [facility]  Subjective: Pt has responded to oxytocin with a good labor pattern, MVU's adequate since restarting pitocin  Objective: BP 137/63 mmHg  Pulse 66  Temp(Src) 97.7 F (36.5 C) (Axillary)  Resp 18  Ht 5\' 5"  (1.651 m)  Wt 311 lb (141.069 kg)  BMI 51.75 kg/m2  SpO2 100%  LMP 11/16/2014 (Approximate)   Total I/O In: -  Out: 400 [Urine:400]  FHT:  FHR: 145 bpm, variability: moderate,  accelerations:  Present,  decelerations:  Absent UC:   regular, every 3 minutes SVE:   Dilation: 5 Effacement (%): 70 Station: -3 Exam by:: Dannette BarbaraJohannes, DU Pisanie, Philipp DeputyKim Shaw, CNM  Exam By me 4-5/80/-3 with lite bloody show. Cervix tissue texture still quite firm. Labs: Lab Results  Component Value Date   WBC 7.6 08/15/2015   HGB 11.8* 08/15/2015   HCT 35.6* 08/15/2015   MCV 87.0 08/15/2015   PLT 196 08/15/2015    Assessment / Plan: Induction of labor due to gestational hypertension,  progressing well on pitocin  Labor: Progressing normally but prolonged latent phase. Preeclampsia:   Fetal Wellbeing:  Category I Pain Control:  Epidural I/D:  n/a Anticipated MOD:  undetermined.  Chong January V 08/16/2015, 12:10 AM

## 2015-08-17 ENCOUNTER — Encounter (HOSPITAL_COMMUNITY): Payer: Self-pay | Admitting: Obstetrics and Gynecology

## 2015-08-17 LAB — CBC
HCT: 26.5 % — ABNORMAL LOW (ref 36.0–46.0)
HEMOGLOBIN: 9 g/dL — AB (ref 12.0–15.0)
MCH: 29.8 pg (ref 26.0–34.0)
MCHC: 34 g/dL (ref 30.0–36.0)
MCV: 87.7 fL (ref 78.0–100.0)
Platelets: 154 10*3/uL (ref 150–400)
RBC: 3.02 MIL/uL — ABNORMAL LOW (ref 3.87–5.11)
RDW: 14.2 % (ref 11.5–15.5)
WBC: 10.5 10*3/uL (ref 4.0–10.5)

## 2015-08-17 LAB — BIRTH TISSUE RECOVERY COLLECTION (PLACENTA DONATION)

## 2015-08-17 NOTE — Lactation Note (Signed)
This note was copied from the chart of [name]. Lactation Consultation Note  Patient Name: [name] ZOXWR'UToday's Date: 08/17/2015 Reason for consult: Follow-up assessment  Per mom the baby recently breast fed 15 mins each breast . Per mom ' The latches have been comfortable. LC discussed the benefits of prior  To latching the 1st breast - breast massage , hand express, ( per mom comfortable with technique )  And breast compressions with latch until swallows and then intermittent.  LC changed a large wet diaper for mom while she was eating her lunch and once baby swaddled baby  Content.    Maternal Data Has patient been taught Hand Expression?: Yes (reviewed with mom )  Feeding Feeding Type:  (per mom is familiar with hand expressing and  already doing it ) Length of feed: 15 min (per mom )  LATCH Score/Interventions Latch: Grasps breast easily, tongue down, lips flanged, rhythmical sucking.  Audible Swallowing: A few with stimulation Intervention(s): Skin to skin;Hand expression  Type of Nipple: Everted at rest and after stimulation  Comfort (Breast/Nipple): Soft / non-tender     Hold (Positioning): Assistance needed to correctly position infant at breast and maintain latch. Intervention(s): Breastfeeding basics reviewed  LATCH Score: 8  Lactation Tools Discussed/Used     Consult Status Consult Status: Follow-up Date: 08/18/15 Follow-up type: In-patient    Kathrin Greathouseorio, Udell Mazzocco Ann 08/17/2015, 4:21 PM

## 2015-08-17 NOTE — Progress Notes (Signed)
Betty Drownrica G Repinski is a 29 y.o. G3 now P2012 who is POD # 1 s/p rLTCS at 1538w3d following IOL for gHTN and a failed TOLAC.  Subjective: Patient reports doing well this morning. States she is having very minimal pain, only discomfort with positional changes. Ambulating to bathroom without issues, reports normal voiding. Tolerating regular diet. Reports mild lochia with minimal dark bleeding, which is decreasing. Pertinent negatives on ROS include no blurry vision, no headache, no dyspnea, no chest pain, no epigastric or RUQ pain, no nausea or vomiting, no fevers, no hematuria, no dysuria.  Objective: Filed Vitals:   08/16/15 1742 08/16/15 1820 08/16/15 2200 08/17/15 0230  BP: 115/70 113/63 114/54 107/50  Pulse: 66 64 61 66  Temp: 98.3 F (36.8 C) 98.2 F (36.8 C) 97.9 F (36.6 C) 98.2 F (36.8 C)  TempSrc: Oral Oral Oral Oral  Resp: 20 20 18 18   SpO2: 97%  98% 98%    GEN: Sleeping but easily arousable, comfortable-appearing woman resting in hospital bed, holding baby. PULM: CTAB on frontal field exam CV: RRR, S1 and S2 heard, no M/R/G appreciated ABD: Fundus difficult to palpate d/t body habitus. Abdomen appropriately TTP. No epigastric or RUQ pain. No guarding. INCISION: Dressing in place is C/D/I. No erythema, exudate, or bleeding noted. EXTR: No LE edema or calf tenderness.   Labs CBC Latest Ref Rng 08/17/2015 08/15/2015 08/13/2015  WBC 4.0 - 10.5 K/uL 10.5 7.6 9.9  Hemoglobin 12.0 - 15.0 g/dL 9.0(L) 11.8(L) 11.9(L)  Hematocrit 36.0 - 46.0 % 26.5(L) 35.6(L) 34.2(L)  Platelets 150 - 400 K/uL 154 196 200   Assessment / Plan: Betty Shelton is a 29 y.o. [id number] who is POD 1 s/p rLTCS at 3938w3d following IOL for gHTN and failed TOLAC.  -- Pain controlled  -- Hemodynamically stable -- BPs WNL since delivery -- Tolerating regular diet -- Ambulating and voiding without issue -- no BM yet, +flatus -- Feeding method: breast -- Planned contraception method: IUD -- Circ  outpatient  Diet: Regular PPx: No DVT ppx at this time - patient is low risk and ambulating ad lib Code Status: FULL Dispo: Continue inpatient post-partum care x 48 hours s/p caesarian as is standard of care. If patient continues to do well anticipate d/c tomorrow.  Gerri Sporeaitlin Nikia Mangino, MD 08/17/2015 7:28 AM
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
## 2015-08-18 MED ORDER — IBUPROFEN 600 MG PO TABS: 600.0000 mg | ORAL_TABLET | Freq: Four times a day (QID) | ORAL | Status: DC | PRN

## 2015-08-18 MED ORDER — OXYCODONE-ACETAMINOPHEN 5-325 MG PO TABS: 1.0000 | ORAL_TABLET | ORAL | Status: DC | PRN

## 2015-08-18 NOTE — Op Note (Signed)
08/13/2015 - 08/16/2015  4:03 AM  PATIENT:  Pam DrownErica G Ackroyd  29 y.o. female  PRE-OPERATIVE DIAGNOSIS:  Failure to Progess  POST-OPERATIVE DIAGNOSIS:  Failure to Progess/cephalopelvic disproportion  PROCEDURE:  Procedure(s): CESAREAN SECTION (N/A)  SURGEON:  Surgeon(s) and Role:    * Tilda BurrowJohn Djibril Glogowski V, MD - Primary  PHYSICIAN ASSISTANT:   ASSISTANTS: Jason FilaDu Pisanie, Johannes MSIII [facility]   ANESTHESIA:   epidural  EBL:  Total I/O In: 2300 [I.V.:2300] Out: 1100 [Urine:500; Blood:600]  BLOOD ADMINISTERED:none  DRAINS: Urinary Catheter (Foley)   LOCAL MEDICATIONS USED:  NONE  SPECIMEN:  Source of Specimen:  placenta to L&D  DISPOSITION OF SPECIMEN:  N/A  COUNTS:  YES  TOURNIQUET:  * No tourniquets in log *  DICTATION: .Dragon Dictation  PLAN OF CARE: Admit to inpatient   PATIENT DISPOSITION:  PACU - hemodynamically stable.   Delay start of Pharmacological VTE agent (>24hrs) due to surgical blood loss or risk of bleeding: not applicable  Details of procedure. Patient was taken operating room prepped and draped for lower surgery with transverse lower abdominal incision performed sharply dissecting to the fascia which was opened transversely and the peritoneal cavity opened in the midline. Bladder flap was developed and transverse uterine incision performed identifying the fetal vertex which which were rotated into the incision and delivered easily placenta followed and uterus was irrigated and closed with 2 layer closure single layer running locking 0 Monocryl followed by continuous running 0 Monocryl abdomen was irrigated, anterior peritoneum closed with 2-0 chromic, the fascia closed with 0 Vicryl, subcutaneous tissue approximated with transverse horizontal mattress sutures of 2-0 Vicryl followed by subcuticular 4-0 Vicryl skin closure sponge and needle counts correct

## 2015-08-18 NOTE — Discharge Summary (Signed)
OB Discharge Summary     Patient Name: Betty Shelton DOB: [birth year] MRN: [id number]  Date of admission: 08/13/2015 Delivering MD: Tilda BurrowFERGUSON, JOHN V   Date of discharge: 08/18/2015  Admitting diagnosis: direct admit, 39w  Intrauterine pregnancy: [id number]     Secondary diagnosis:  Active Problems:   Gestational hypertension  Additional problems: none     Discharge diagnosis: Term Pregnancy Delivered and Gestational Hypertension ; failed TOLAC                                                                                               Post partum procedures:none  Augmentation: Pitocin and Foley Balloon  Complications: None  Hospital course:  Induction of Labor With Cesarean Section  [name] was admitted to the hospital 08/13/2015 for induction of labor. Patient had a labor course significant for IOL w/ foley and Pitocin x 2 days until she became failure to progress despite adequate ctx. The patient went for cesarean section due to Arrest of Dilation, and delivered a Viable infant,@BABYSUPPRESS (DBLINK,ept,110,,1,,) Membrane Rupture Time/Date: )6:44 PM ,08/15/2015   @Details  of operation can be found in separate operative Note.  Patient had an uncomplicated postpartum course. She is ambulating, tolerating a regular diet, passing flatus, and urinating well.  Patient is discharged home in stable condition on 08/18/15.                                     Physical exam  Filed Vitals:   08/16/15 2200 08/17/15 0230 08/17/15 1853 08/18/15 0522  BP: 114/54 107/50 105/58 138/93  Pulse: 61 66 75 78  Temp: 97.9 F (36.6 C) 98.2 F (36.8 C) 98 F (36.7 C) 98 F (36.7 C)  TempSrc: Oral Oral Oral Oral  Resp: 18 18 18 19   Height:      Weight:      SpO2: 98% 98%     General: alert, cooperative and no distress Lochia: appropriate Uterine Fundus: firm Incision: Dressing is clean, dry, and intact DVT Evaluation: No evidence of DVT seen on physical exam. Labs: Lab  Results  Component Value Date   WBC 10.5 08/17/2015   HGB 9.0* 08/17/2015   HCT 26.5* 08/17/2015   MCV 87.7 08/17/2015   PLT 154 08/17/2015   CMP Latest Ref Rng 08/13/2015  Glucose 65 - 99 mg/dL 90  BUN 6 - 20 mg/dL 7  Creatinine 2.950.44 - 6.211.00 mg/dL 3.080.64  Sodium 657135 - 846145 mmol/L 135  Potassium 3.5 - 5.1 mmol/L 3.6  Chloride 101 - 111 mmol/L 109  CO2 22 - 32 mmol/L 21(L)  Calcium 8.9 - 10.3 mg/dL 9.6(E8.5(L)  Total Protein 6.5 - 8.1 g/dL 6.4(L)  Total Bilirubin 0.3 - 1.2 mg/dL 0.4  Alkaline Phos 38 - 126 U/L 128(H)  AST 15 - 41 U/L 13(L)  ALT 14 - 54 U/L 8(L)    Discharge instruction: per After Visit Summary and "Baby and Me Booklet".  Medications:MEDICATIONS: See list below After visit meds:    Medication List  TAKE these medications        CONCEPT OB 130-92.4-1 MG Caps  Take 1 capsule by mouth daily.     ibuprofen 600 MG tablet  Commonly known as:  ADVIL,MOTRIN  Take 1 tablet (600 mg total) by mouth every 6 (six) hours as needed.     oxyCODONE-acetaminophen 5-325 MG tablet  Commonly known as:  PERCOCET/ROXICET  Take 1 tablet by mouth every 4 (four) hours as needed (for pain scale 4-7).        Diet: routine diet  Activity: Advance as tolerated. Pelvic rest for 6 weeks.   Outpatient follow up:6 weeks Follow up Appt:No future appointments. Follow up Visit:No Follow-up on file.  Postpartum contraception: IUD Paragard  Newborn Data: Live born female  Birth Weight: 7 lb 10.9 oz (3485 g) APGAR: 8, 9  Baby Feeding: Breast Disposition:home with mother   08/18/2015 Cam Hai, CNM  08/18/2015

## 2015-08-18 NOTE — Discharge Instructions (Signed)
Postpartum Care After Cesarean Delivery °After you deliver your newborn (postpartum period), the usual stay in the hospital is 24-72 hours. If there were problems with your labor or delivery, or if you have other medical problems, you might be in the hospital longer.  °While you are in the hospital, you will receive help and instructions on how to care for yourself and your newborn during the postpartum period.  °While you are in the hospital: °· It is normal for you to have pain or discomfort from the incision in your abdomen. Be sure to tell your nurses when you are having pain, where the pain is located, and what makes the pain worse. °· If you are breastfeeding, you may feel uncomfortable contractions of your uterus for a couple of weeks. This is normal. The contractions help your uterus get back to normal size. °· It is normal to have some bleeding after delivery. °· For the first 1-3 days after delivery, the flow is red and the amount may be similar to a period. °· It is common for the flow to start and stop. °· In the first few days, you may pass some small clots. Let your nurses know if you begin to pass large clots or your flow increases. °· Do not  flush blood clots down the toilet before having the nurse look at them. °· During the next 3-10 days after delivery, your flow should become more watery and pink or brown-tinged in color. °· Ten to fourteen days after delivery, your flow should be a small amount of yellowish-white discharge. °· The amount of your flow will decrease over the first few weeks after delivery. Your flow may stop in 6-8 weeks. Most women have had their flow stop by 12 weeks after delivery. °· You should change your sanitary pads frequently. °· Wash your hands thoroughly with soap and water for at least 20 seconds after changing pads, using the toilet, or before holding or feeding your newborn. °· Your intravenous (IV) tubing will be removed when you are drinking enough fluids. °· The  urine drainage tube (urinary catheter) that was inserted before delivery may be removed within 6-8 hours after delivery or when feeling returns to your legs. You should feel like you need to empty your bladder within the first 6-8 hours after the catheter has been removed. °· In case you become weak, lightheaded, or faint, call your nurse before you get out of bed for the first time and before you take a shower for the first time. °· Within the first few days after delivery, your breasts may begin to feel tender and full. This is called engorgement. Breast tenderness usually goes away within 48-72 hours after engorgement occurs. You may also notice milk leaking from your breasts. If you are not breastfeeding, do not stimulate your breasts. Breast stimulation can make your breasts produce more milk. °· Spending as much time as possible with your newborn is very important. During this time, you and your newborn can feel close and get to know each other. Having your newborn stay in your room (rooming in) will help to strengthen the bond with your newborn. It will give you time to get to know your newborn and become comfortable caring for your newborn. °· Your hormones change after delivery. Sometimes the hormone changes can temporarily cause you to feel sad or tearful. These feelings should not last more than a few days. If these feelings last longer than that, you should talk to your   caregiver. °· If desired, talk to your caregiver about methods of family planning or contraception. °· Talk to your caregiver about immunizations. Your caregiver may want you to have the following immunizations before leaving the hospital: °· Tetanus, diphtheria, and pertussis (Tdap) or tetanus and diphtheria (Td) immunization. It is very important that you and your family (including grandparents) or others caring for your newborn are up-to-date with the Tdap or Td immunizations. The Tdap or Td immunization can help protect your newborn  from getting ill. °· Rubella immunization. °· Varicella (chickenpox) immunization. °· Influenza immunization. You should receive this annual immunization if you did not receive the immunization during your pregnancy. °  °This information is not intended to replace advice given to you by your health care provider. Make sure you discuss any questions you have with your health care provider. °  °Document Released: 06/23/2012 Document Reviewed: 06/23/2012 °Elsevier Interactive Patient Education ©2016 [employer]vier Inc. ° °Iron-Rich Diet °Iron is a mineral that helps your body to produce hemoglobin. Hemoglobin is a protein in your red blood cells that carries oxygen to your body's tissues. Eating too little iron may cause you to feel weak and tired, and it can increase your risk for infection. Eating enough iron is necessary for your body's metabolism, muscle function, and nervous system. °Iron is naturally found in many foods. It can also be added to foods or fortified in foods. There are two types of dietary iron: °· Heme iron. Heme iron is absorbed by the body more easily than nonheme iron. Heme iron is found in meat, poultry, and fish. °· Nonheme iron. Nonheme iron is found in dietary supplements, iron-fortified grains, beans, and vegetables. °You may need to follow an iron-rich diet if: °· You have been diagnosed with iron deficiency or iron-deficiency anemia. °· You have a condition that prevents you from absorbing dietary iron, such as: °¨ Infection in your intestines. °¨ Celiac disease. This involves long-lasting (chronic) inflammation of your intestines. °· You do not eat enough iron. °· You eat a diet that is high in foods that impair iron absorption. °· You have lost a lot of blood. °· You have heavy bleeding during your menstrual cycle. °· You are pregnant. °WHAT IS MY PLAN? °Your health care provider may help you to determine how much iron you need per day based on your condition. Generally, when a person  consumes sufficient amounts of iron in the diet, the following iron needs are met: °· Men. °¨ 14-18 years old: 11 mg per day. °¨ 19-50 years old: 8 mg per day. °· Women.   °¨ 14-18 years old: 15 mg per day. °¨ 19-50 years old: 18 mg per day. °¨ Over 50 years old: 8 mg per day. °¨ Pregnant women: 27 mg per day. °¨ Breastfeeding women: 9 mg per day. °WHAT DO I NEED TO KNOW ABOUT AN IRON-RICH DIET? °· Eat fresh fruits and vegetables that are high in vitamin C along with foods that are high in iron. This will help increase the amount of iron that your body absorbs from food, especially with foods containing nonheme iron. Foods that are high in vitamin C include oranges, peppers, tomatoes, and mango. °· Take iron supplements only as directed by your health care provider. Overdose of iron can be life-threatening. If you were prescribed iron supplements, take them with orange juice or a vitamin C supplement. °· Cook foods in pots and pans that are made from iron.   °· Eat nonheme iron-containing foods alongside foods that   are high in heme iron. This helps to improve your iron absorption.   °· Certain foods and drinks contain compounds that impair iron absorption. Avoid eating these foods in the same meal as iron-rich foods or with iron supplements. These include: °¨ Coffee, black tea, and red wine. °¨ Milk, dairy products, and foods that are high in calcium. °¨ Beans, soybeans, and peas. °¨ Whole grains. °· When eating foods that contain both nonheme iron and compounds that impair iron absorption, follow these tips to absorb iron better.   °¨ Soak beans overnight before cooking. °¨ Soak whole grains overnight and drain them before using. °¨ Ferment flours before baking, such as using yeast in bread dough. °WHAT FOODS CAN I EAT? °Grains  °Iron-fortified breakfast cereal. Iron-fortified whole-wheat bread. Enriched rice. Sprouted grains. °Vegetables  °Spinach. Potatoes with skin. Green peas. Broccoli. Red and green bell  peppers. Fermented vegetables. °Fruits  °Prunes. Raisins. Oranges. Strawberries. Mango. Grapefruit. °Meats and Other Protein Sources  °Beef liver. Oysters. Beef. Shrimp. Turkey. Chicken. Tuna. Sardines. Chickpeas. Nuts. Tofu. °Beverages  °Tomato juice. Fresh orange juice. Prune juice. Hibiscus tea. Fortified instant breakfast shakes. °Condiments  °Tahini. Fermented soy sauce.  °Sweets and Desserts  °Black-strap molasses.  °Other  °Wheat germ. °The items listed above may not be a complete list of recommended foods or beverages. Contact your dietitian for more options.  °WHAT FOODS ARE NOT RECOMMENDED? °Grains  °Whole grains. Bran cereal. Bran flour. Oats. °Vegetables  °Artichokes. Brussels sprouts. Kale. °Fruits  °Blueberries. Raspberries. Strawberries. Figs. °Meats and Other Protein Sources  °Soybeans. Products made from soy protein. °Dairy  °Milk. Cream. Cheese. Yogurt. Cottage cheese. °Beverages  °Coffee. Black tea. Red wine. °Sweets and Desserts  °Cocoa. Chocolate. Ice cream. °Other  °Basil. Oregano. Parsley. °The items listed above may not be a complete list of foods and beverages to avoid. Contact your dietitian for more information.  °  °This information is not intended to replace advice given to you by your health care provider. Make sure you discuss any questions you have with your health care provider. °  °Document Released: 05/13/2005 Document Revised: 10/20/2014 Document Reviewed: 04/26/2014 °Elsevier Interactive Patient Education ©2016 Elsevier Inc. ° °

## 2015-09-18 ENCOUNTER — Ambulatory Visit (INDEPENDENT_AMBULATORY_CARE_PROVIDER_SITE_OTHER): Payer: BLUE CROSS/BLUE SHIELD | Admitting: Obstetrics & Gynecology

## 2015-09-18 ENCOUNTER — Encounter: Payer: Self-pay | Admitting: Obstetrics & Gynecology

## 2015-09-18 DIAGNOSIS — Z30430 Encounter for insertion of intrauterine contraceptive device: Secondary | ICD-10-CM | POA: Diagnosis not present

## 2015-09-18 NOTE — Progress Notes (Signed)
Subjective:     Betty Shelton is a 29 y.o. [phone] female who presents for a postpartum visit. She is 4 weeks postpartum following a PLTCS for failure of cervical dilation. I have fully reviewed the prenatal and intrapartum course; had GHTN. The delivery was at 39 gestational weeks. Postpartum course has been uncomplicated . Baby's course has been uncomplicated. Baby is feeding by breast. Bleeding no bleeding. Bowel function is normal. Bladder function is normal. Patient is not sexually active. Contraception method is none, desires Skyla. Postpartum depression screening: negative.  The following portions of the patient's history were reviewed and updated as appropriate: allergies, current medications, past family history, past medical history, past social history, past surgical history and problem list. Normal pap 11/06/2104.  Review of Systems Pertinent items noted in HPI and remainder of comprehensive ROS otherwise negative.   Objective:    BP 118/81 mmHg  Pulse 66  Wt 280 lb (127.007 kg)  Breastfeeding? Yes  General:  alert and no distress   Breasts:  inspection negative, no nipple discharge or bleeding, no masses or nodularity palpable  Lungs: clear to auscultation bilaterally  Heart:  regular rate and rhythm  Abdomen: soft, non-tender; bowel sounds normal; no masses,  no organomegaly.  Incision C/D/I, no erythema, no induration   Pelvic:  not evaluated      IUD Insertion Procedure Note Patient identified, informed consent performed, consent signed.   Discussed risks of irregular bleeding, cramping, infection, malpositioning or misplacement of the IUD outside the uterus which may require further procedure such as laparoscopy. Time out was performed.  Urine pregnancy test negative.  Speculum placed in the vagina.  Cervix visualized.  Cleaned with Betadine x 2.  Grasped anteriorly with a single tooth tenaculum.  Uterus sounded to 9 cm.  Skyla IUD placed per manufacturer's recommendations.   Strings trimmed to 3 cm. Tenaculum was removed, good hemostasis noted.  Patient tolerated procedure well.   Patient was given post-procedure instructions.  She was advised to have backup contraception for one week.     Assessment:   Normal postpartum exam. Pap smear not done at today's visit.  Skyla placed.  Plan:   1. Contraception: IUD. Skyla placed today. 2. Follow up in: 4 weeks for IUD check or as needed.    Jaynie CollinsUGONNA  Kimberle Stanfill, MD, FACOG Attending Obstetrician & Gynecologist, [facility]

## 2015-09-25 ENCOUNTER — Encounter: Payer: Self-pay | Admitting: *Deleted

## 2015-10-22 ENCOUNTER — Encounter: Payer: Self-pay | Admitting: *Deleted

## 2015-10-22 ENCOUNTER — Encounter: Payer: Self-pay | Admitting: Family Medicine

## 2015-10-22 ENCOUNTER — Ambulatory Visit (INDEPENDENT_AMBULATORY_CARE_PROVIDER_SITE_OTHER): Payer: BLUE CROSS/BLUE SHIELD | Admitting: Family Medicine

## 2015-10-22 VITALS — BP 136/87 | HR 76 | Resp 20 | Ht 65.0 in | Wt 292.0 lb

## 2015-10-22 DIAGNOSIS — Z30431 Encounter for routine checking of intrauterine contraceptive device: Secondary | ICD-10-CM | POA: Diagnosis not present

## 2015-10-22 NOTE — Progress Notes (Signed)
    Subjective:    Patient ID: Betty Shelton is a 30 y.o. female presenting with String check  on 10/22/2015  HPI: Here for string check. Reports some small amount of spotting. Continues nursing. IUD placed 4 wks ago--Skyla.  Review of Systems  Constitutional: Negative for fever and chills.  Respiratory: Negative for shortness of breath.   Cardiovascular: Negative for chest pain.  Gastrointestinal: Negative for nausea, vomiting and abdominal pain.  Genitourinary: Negative for dysuria.  Skin: Negative for rash.      Objective:    BP 136/87 mmHg  Pulse 76  Resp 20  Ht 5\' 5"  (1.651 m)  Wt 292 lb (132.45 kg)  BMI 48.59 kg/m2  LMP 09/18/2015 Physical Exam  Constitutional: She is oriented to person, place, and time. She appears well-developed and well-nourished. No distress.  HENT:  Head: Normocephalic and atraumatic.  Eyes: No scleral icterus.  Neck: Neck supple.  Cardiovascular: Normal rate.   Pulmonary/Chest: Effort normal.  Abdominal: Soft.  Genitourinary: Vagina normal.  BUS normal, vagina is pink and rugated, cervix is parous without lesion, IUD strings are not visualized.   Neurological: She is alert and oriented to person, place, and time.  Skin: Skin is warm and dry.  Psychiatric: She has a normal mood and affect.   TVUS performed and reveals IUD in correct position in uterus.     Assessment & Plan:  IUD check up  Correct placement.  Return if symptoms worsen or fail to improve.  Alp Goldwater S 10/22/2015 1:28 PM

## 2015-10-22 NOTE — Patient Instructions (Signed)
Levonorgestrel intrauterine device (IUD) What is this medicine? LEVONORGESTREL IUD (LEE voe nor jes trel) is a contraceptive (birth control) device. The device is placed inside the uterus by a healthcare professional. It is used to prevent pregnancy and can also be used to treat heavy bleeding that occurs during your period. Depending on the device, it can be used for 3 to 5 years. This medicine may be used for other purposes; ask your health care provider or pharmacist if you have questions. What should I tell my health care provider before I take this medicine? They need to know if you have any of these conditions: -abnormal Pap smear -cancer of the breast, uterus, or cervix -diabetes -endometritis -genital or pelvic infection now or in the past -have more than one sexual partner or your partner has more than one partner -heart disease -history of an ectopic or tubal pregnancy -immune system problems -IUD in place -liver disease or tumor -problems with blood clots or take blood-thinners -use intravenous drugs -uterus of unusual shape -vaginal bleeding that has not been explained -an unusual or allergic reaction to levonorgestrel, other hormones, silicone, or polyethylene, medicines, foods, dyes, or preservatives -pregnant or trying to get pregnant -breast-feeding How should I use this medicine? This device is placed inside the uterus by a health care professional. Talk to your pediatrician regarding the use of this medicine in children. Special care may be needed. Overdosage: If you think you have taken too much of this medicine contact a poison control center or emergency room at once. NOTE: This medicine is only for you. Do not share this medicine with others. What if I miss a dose? This does not apply. What may interact with this medicine? Do not take this medicine with any of the following medications: -amprenavir -bosentan -fosamprenavir This medicine may also interact with  the following medications: -aprepitant -barbiturate medicines for inducing sleep or treating seizures -bexarotene -griseofulvin -medicines to treat seizures like carbamazepine, ethotoin, felbamate, oxcarbazepine, phenytoin, topiramate -modafinil -pioglitazone -rifabutin -rifampin -rifapentine -some medicines to treat HIV infection like atazanavir, indinavir, lopinavir, nelfinavir, tipranavir, ritonavir -St. John's wort -warfarin This list may not describe all possible interactions. Give your health care provider a list of all the medicines, herbs, non-prescription drugs, or dietary supplements you use. Also tell them if you smoke, drink alcohol, or use illegal drugs. Some items may interact with your medicine. What should I watch for while using this medicine? Visit your doctor or health care professional for regular check ups. See your doctor if you or your partner has sexual contact with others, becomes HIV positive, or gets a sexual transmitted disease. This product does not protect you against HIV infection (AIDS) or other sexually transmitted diseases. You can check the placement of the IUD yourself by reaching up to the top of your vagina with clean fingers to feel the threads. Do not pull on the threads. It is a good habit to check placement after each menstrual period. Call your doctor right away if you feel more of the IUD than just the threads or if you cannot feel the threads at all. The IUD may come out by itself. You may become pregnant if the device comes out. If you notice that the IUD has come out use a backup birth control method like condoms and call your health care provider. Using tampons will not change the position of the IUD and are okay to use during your period. What side effects may I notice from receiving this medicine?   Side effects that you should report to your doctor or health care professional as soon as possible: -allergic reactions like skin rash, itching or  hives, swelling of the face, lips, or tongue -fever, flu-like symptoms -genital sores -high blood pressure -no menstrual period for 6 weeks during use -pain, swelling, warmth in the leg -pelvic pain or tenderness -severe or sudden headache -signs of pregnancy -stomach cramping -sudden shortness of breath -trouble with balance, talking, or walking -unusual vaginal bleeding, discharge -yellowing of the eyes or skin Side effects that usually do not require medical attention (report to your doctor or health care professional if they continue or are bothersome): -acne -breast pain -change in sex drive or performance -changes in weight -cramping, dizziness, or faintness while the device is being inserted -headache -irregular menstrual bleeding within first 3 to 6 months of use -nausea This list may not describe all possible side effects. Call your doctor for medical advice about side effects. You may report side effects to FDA at 1-800-FDA-1088. Where should I keep my medicine? This does not apply. NOTE: This sheet is a summary. It may not cover all possible information. If you have questions about this medicine, talk to your doctor, pharmacist, or health care provider.    2016, Elsevier/Gold Standard. (2011-10-30 13:54:04)  

## 2015-12-25 ENCOUNTER — Telehealth (HOSPITAL_COMMUNITY): Payer: Self-pay | Admitting: Lactation Services

## 2015-12-25 NOTE — Telephone Encounter (Signed)
Mom called concerned about milk supply Was able to pump 5 oz at a pumping now only 1/2 oz. Baby is still nursing well and seems satisfied after most nursings. Using same pump- no change in that- [employer] at work [employer] at home. Feels suction is the same. Has tried Mother's tea, essential oils and power pumping. Back at work Suggested more fluids- she states she probably could do better at that, oatmeal and lactation cookies. Mom will try these things and call back if needed, No further questions at present.

## 2016-02-28 ENCOUNTER — Encounter: Payer: Self-pay | Admitting: Obstetrics and Gynecology

## 2016-02-28 ENCOUNTER — Ambulatory Visit (INDEPENDENT_AMBULATORY_CARE_PROVIDER_SITE_OTHER): Payer: BLUE CROSS/BLUE SHIELD | Admitting: Obstetrics and Gynecology

## 2016-02-28 VITALS — BP 123/80 | HR 87 | Ht 65.0 in | Wt 316.0 lb

## 2016-02-28 DIAGNOSIS — Z30431 Encounter for routine checking of intrauterine contraceptive device: Secondary | ICD-10-CM | POA: Diagnosis not present

## 2016-02-28 DIAGNOSIS — Z720 Tobacco use: Secondary | ICD-10-CM

## 2016-02-28 DIAGNOSIS — Z124 Encounter for screening for malignant neoplasm of cervix: Secondary | ICD-10-CM | POA: Diagnosis not present

## 2016-02-28 DIAGNOSIS — Z113 Encounter for screening for infections with a predominantly sexual mode of transmission: Secondary | ICD-10-CM | POA: Diagnosis not present

## 2016-02-28 DIAGNOSIS — F17200 Nicotine dependence, unspecified, uncomplicated: Secondary | ICD-10-CM

## 2016-02-28 DIAGNOSIS — Z01419 Encounter for gynecological examination (general) (routine) without abnormal findings: Secondary | ICD-10-CM | POA: Diagnosis not present

## 2016-02-28 NOTE — Progress Notes (Signed)
Patient ID: Betty Shelton, female   DOB: Feb 16, 1986, 30 y.o.   MRN: [id number]  Reason for Visit: Annual GYN  SUBJECTIVE: Betty MIMBS is a 30 y.o. G3P2012 at 6 months postpartum. No concerns. Stopped breastfeeding at 4 months. Working FT and also has 28 yo. No questions re: C/S for FTP after 6 cm. Sexually active without issues with libido which she had with Mirena in past. Last Pap 10/2014 was negative. Pt uncertain timing of any previous Paps, denies hx abnormal. Happy with Skyla IUD and having scant menses. String not found at last exam but device in place by [place]. She cannot feel string.  Not dieting or exercising which she attributes to busy lifestyle and single parenting. Smokes 2 cigarettes a day. Uses seatbelts.   Past Medical History  Diagnosis Date  . Morbid obesity with BMI of 50.0-59.9, adult (HCC)   . Medical history non-contributory    OB History  Gravida Para Term Preterm AB SAB TAB Ectopic Multiple Living  0 1 1 0 0 0 2    # Outcome Date GA Lbr Len/2nd Weight Sex Delivery Anes PTL Lv  3 Term 08/16/15 [redacted]w[redacted]d  7 lb 10.9 oz (3.485 kg) M CS-LTranv EPI  Y  2 Term 07/28/09 [redacted]w[redacted]d  5 lb 10 oz (2.551 kg) F CS-LTranv  N Y  1 SAB               Past Surgical History  Procedure Laterality Date  . Cesarean section    . Tooth extraction  2014  . Wisdom tooth extraction    . Iud removal N/A 11/28/2013    Procedure: INTRAUTERINE DEVICE (IUD) REMOVAL;  Surgeon: Allie Bossier, MD;  Location: [facility];  Service: Gynecology;  Laterality: N/A;  . Cesarean section N/A 08/16/2015    Procedure: CESAREAN SECTION;  Surgeon: Tilda Burrow, MD;  Location: [facility];  Service: Obstetrics;  Laterality: N/A;   Outpatient Encounter Prescriptions as of 02/28/2016  Medication Sig  . Levonorgestrel (SKYLA) 13.5 MG IUD by Intrauterine route.  . [DISCONTINUED] Prenat w/o A Vit-FeFum-FePo-FA (CONCEPT OB) 130-92.4-1 MG CAPS Take 1 capsule by mouth daily.   No facility-administered encounter medications on  file as of 02/28/2016.  Review of Systems  Respiratory: Negative for cough.   Genitourinary: Negative for dysuria, urgency and frequency.  Musculoskeletal: Negative for myalgias.  Skin:       Every few months gets boils in axillae, none at present  Neurological: Negative for dizziness, weakness and headaches.  Psychiatric/Behavioral: Negative for depression. The patient is not nervous/anxious and does not have insomnia.    OBJECTIVE: Filed Vitals:   02/28/16 1342  BP: 123/80  Pulse: 87  Height:  (1.651 m)  Weight: 316 lb (143.337 kg)  Physical Exam  Constitutional: She is well-developed, well-nourished, and in no distress. No distress.  Obese   HENT:  Head: Normocephalic.  Eyes: Pupils are equal, round, and reactive to light.  Neck: Normal range of motion. Neck supple. No thyromegaly present.  Cardiovascular: Normal rate, regular rhythm and normal heart sounds.   No murmur heard. Pulmonary/Chest: Effort normal and breath sounds normal.  Abdominal: Soft. She exhibits no mass. There is no tenderness. There is no guarding.  Genitourinary: Uterus normal and cervix normal.  NEFG Vagina with homogenous white discharge Cervix clean, posterior with IUD string short, about 0.5 cm from ext os Uterus and adnexae difficult to outline due to body habitus, NT, no enlargement or masses noted  Nursing note and vitals reviewed.  ASSESSMENT and PLAN: Smoker  Encounter for routine gynecological examination - Plan: Cytology - PAP, HIV antibody, Hepatitis B surface antigen, Hepatitis C antibody, RPR Information on smoking cessation, weight loss diet and strategies. Will start walking around track.  Reassured IUD string seen Betty OrleansDeirdre C Lener Shelton, CNM 02/28/2016 4:37 PM

## 2016-02-28 NOTE — Patient Instructions (Signed)
Calorie Counting for Weight Loss Calories are energy you get from the things you eat and drink. Your body uses this energy to keep you going throughout the day. The number of calories you eat affects your weight. When you eat more calories than your body needs, your body stores the extra calories as fat. When you eat fewer calories than your body needs, your body burns fat to get the energy it needs. Calorie counting means keeping track of how many calories you eat and drink each day. If you make sure to eat fewer calories than your body needs, you should lose weight. In order for calorie counting to work, you will need to eat the number of calories that are right for you in a day to lose a healthy amount of weight per week. A healthy amount of weight to lose per week is usually 1-2 lb (0.5-0.9 kg). A dietitian can determine how many calories you need in a day and give you suggestions on how to reach your calorie goal.  WHAT IS MY MY PLAN? My goal is to have __________ calories per day.  If I have this many calories per day, I should lose around __________ pounds per week. WHAT DO I NEED TO KNOW ABOUT CALORIE COUNTING? In order to meet your daily calorie goal, you will need to:  Find out how many calories are in each food you would like to eat. Try to do this before you eat.  Decide how much of the food you can eat.  Write down what you ate and how many calories it had. Doing this is called keeping a food log. WHERE DO I FIND CALORIE INFORMATION? The number of calories in a food can be found on a Nutrition Facts label. Note that all the information on a label is based on a specific serving of the food. If a food does not have a Nutrition Facts label, try to look up the calories online or ask your dietitian for help. HOW DO I DECIDE HOW MUCH TO EAT? To decide how much of the food you can eat, you will need to consider both the number of calories in one serving and the size of one serving. This  information can be found on the Nutrition Facts label. If a food does not have a Nutrition Facts label, look up the information online or ask your dietitian for help. Remember that calories are listed per serving. If you choose to have more than one serving of a food, you will have to multiply the calories per serving by the amount of servings you plan to eat. For example, the label on a package of bread might say that a serving size is 1 slice and that there are 90 calories in a serving. If you eat 1 slice, you will have eaten 90 calories. If you eat 2 slices, you will have eaten 180 calories. HOW DO I KEEP A FOOD LOG? After each meal, record the following information in your food log:  What you ate.  How much of it you ate.  How many calories it had.  Then, add up your calories. Keep your food log near you, such as in a small notebook in your pocket. Another option is to use a mobile app or website. Some programs will calculate calories for you and show you how many calories you have left each time you add an item to the log. WHAT ARE SOME CALORIE COUNTING TIPS?  Use your calories on foods   and drinks that will fill you up and not leave you hungry. Some examples of this include foods like nuts and nut butters, vegetables, lean proteins, and high-fiber foods (more than 5 g fiber per serving).  Eat nutritious foods and avoid empty calories. Empty calories are calories you get from foods or beverages that do not have many nutrients, such as candy and soda. It is better to have a nutritious high-calorie food (such as an avocado) than a food with few nutrients (such as a bag of chips).  Know how many calories are in the foods you eat most often. This way, you do not have to look up how many calories they have each time you eat them.  Look out for foods that may seem like low-calorie foods but are really high-calorie foods, such as baked goods, soda, and fat-free candy.  Pay attention to calories  in drinks. Drinks such as sodas, specialty coffee drinks, alcohol, and juices have a lot of calories yet do not fill you up. Choose low-calorie drinks like water and diet drinks.  Focus your calorie counting efforts on higher calorie items. Logging the calories in a garden salad that contains only vegetables is less important than calculating the calories in a milk shake.  Find a way of tracking calories that works for you. Get creative. Most people who are successful find ways to keep track of how much they eat in a day, even if they do not count every calorie. WHAT ARE SOME PORTION CONTROL TIPS?  Know how many calories are in a serving. This will help you know how many servings of a certain food you can have.  Use a measuring cup to measure serving sizes. This is helpful when you start out. With time, you will be able to estimate serving sizes for some foods.  Take some time to put servings of different foods on your favorite plates, bowls, and cups so you know what a serving looks like.  Try not to eat straight from a bag or box. Doing this can lead to overeating. Put the amount you would like to eat in a cup or on a plate to make sure you are eating the right portion.  Use smaller plates, glasses, and bowls to prevent overeating. This is a quick and easy way to practice portion control. If your plate is smaller, less food can fit on it.  Try not to multitask while eating, such as watching TV or using your computer. If it is time to eat, sit down at a table and enjoy your food. Doing this will help you to start recognizing when you are full. It will also make you more aware of what and how much you are eating. HOW CAN I CALORIE COUNT WHEN EATING OUT?  Ask for smaller portion sizes or child-sized portions.  Consider sharing an entree and sides instead of getting your own entree.  If you get your own entree, eat only half. Ask for a box at the beginning of your meal and put the rest of your  entree in it so you are not tempted to eat it.  Look for the calories on the menu. If calories are listed, choose the lower calorie options.  Choose dishes that include vegetables, fruits, whole grains, low-fat dairy products, and lean protein. Focusing on smart food choices from each of the 5 food groups can help you stay on track at restaurants.  Choose items that are boiled, broiled, grilled, or steamed.  Choose   water, milk, unsweetened iced tea, or other drinks without added sugars. If you want an alcoholic beverage, choose a lower calorie option. For example, a regular margarita can have up to 700 calories and a glass of wine has around 150.  Stay away from items that are buttered, battered, fried, or served with cream sauce. Items labeled "crispy" are usually fried, unless stated otherwise.  Ask for dressings, sauces, and syrups on the side. These are usually very high in calories, so do not eat much of them.  Watch out for salads. Many people think salads are a healthy option, but this is often not the case. Many salads come with bacon, fried chicken, lots of cheese, fried chips, and dressing. All of these items have a lot of calories. If you want a salad, choose a garden salad and ask for grilled meats or steak. Ask for the dressing on the side, or ask for olive oil and vinegar or lemon to use as dressing.  Estimate how many servings of a food you are given. For example, a serving of cooked rice is  cup or about the size of half a tennis ball or one cupcake wrapper. Knowing serving sizes will help you be aware of how much food you are eating at restaurants. The list below tells you how big or small some common portion sizes are based on everyday objects.  1 oz--4 stacked dice.  3 oz--1 deck of cards.  1 tsp--1 dice.  1 Tbsp-- a Ping-Pong ball.  2 Tbsp--1 Ping-Pong ball.   cup--1 tennis ball or 1 cupcake wrapper.  1 cup--1 baseball.   This information is not intended to  replace advice given to you by your health care provider. Make sure you discuss any questions you have with your health care provider.   Document Released: 09/29/2005 Document Revised: 10/20/2014 Document Reviewed: 08/04/2013 Elsevier Interactive Patient Education 2016 [employer] Smoking Cessation, Tips for Success If you are ready to quit smoking, congratulations! You have chosen to help yourself be healthier. Cigarettes bring nicotine, tar, carbon monoxide, and other irritants into your body. Your lungs, heart, and blood vessels will be able to work better without these poisons. There are many different ways to quit smoking. Nicotine gum, nicotine patches, a nicotine inhaler, or nicotine nasal spray can help with physical craving. Hypnosis, support groups, and medicines help break the habit of smoking. WHAT THINGS CAN I DO TO MAKE QUITTING EASIER?  Here are some tips to help you quit for good:  Pick a date when you will quit smoking completely. Tell all of your friends and family about your plan to quit on that date.  Do not try to slowly cut down on the number of cigarettes you are smoking. Pick a quit date and quit smoking completely starting on that day.  Throw away all cigarettes.   Clean and remove all ashtrays from your home, work, and car.  On a card, write down your reasons for quitting. Carry the card with you and read it when you get the urge to smoke.  Cleanse your body of nicotine. Drink enough water and fluids to keep your urine clear or pale yellow. Do this after quitting to flush the nicotine from your body.  Learn to predict your moods. Do not let a bad situation be your excuse to have a cigarette. Some situations in your life might tempt you into wanting a cigarette.  Never have "just one" cigarette. It leads to wanting another and another. Remind  yourself of your decision to quit.  Change habits associated with smoking. If you smoked while driving or when feeling  stressed, try other activities to replace smoking. Stand up when drinking your coffee. Brush your teeth after eating. Sit in a different chair when you read the paper. Avoid alcohol while trying to quit, and try to drink fewer caffeinated beverages. Alcohol and caffeine may urge you to smoke.  Avoid foods and drinks that can trigger a desire to smoke, such as sugary or spicy foods and alcohol.  Ask people who smoke not to smoke around you.  Have something planned to do right after eating or having a cup of coffee. For example, plan to take a walk or exercise.  Try a relaxation exercise to calm you down and decrease your stress. Remember, you may be tense and nervous for the first 2 weeks after you quit, but this will pass.  Find new activities to keep your hands busy. Play with a pen, coin, or rubber band. Doodle or draw things on paper.  Brush your teeth right after eating. This will help cut down on the craving for the taste of tobacco after meals. You can also try mouthwash.   Use oral substitutes in place of cigarettes. Try using lemon drops, carrots, cinnamon sticks, or chewing gum. Keep them handy so they are available when you have the urge to smoke.  When you have the urge to smoke, try deep breathing.  Designate your home as a nonsmoking area.  If you are a heavy smoker, ask your health care provider about a prescription for nicotine chewing gum. It can ease your withdrawal from nicotine.  Reward yourself. Set aside the cigarette money you save and buy yourself something nice.  Look for support from others. Join a support group or smoking cessation program. Ask someone at home or at work to help you with your plan to quit smoking.  Always ask yourself, "Do I need this cigarette or is this just a reflex?" Tell yourself, "Today, I choose not to smoke," or "I do not want to smoke." You are reminding yourself of your decision to quit.  Do not replace cigarette smoking with  electronic cigarettes (commonly called e-cigarettes). The safety of e-cigarettes is unknown, and some may contain harmful chemicals.  If you relapse, do not give up! Plan ahead and think about what you will do the next time you get the urge to smoke. HOW WILL I FEEL WHEN I QUIT SMOKING? You may have symptoms of withdrawal because your body is used to nicotine (the addictive substance in cigarettes). You may crave cigarettes, be irritable, feel very hungry, cough often, get headaches, or have difficulty concentrating. The withdrawal symptoms are only temporary. They are strongest when you first quit but will go away within 10-14 days. When withdrawal symptoms occur, stay in control. Think about your reasons for quitting. Remind yourself that these are signs that your body is healing and getting used to being without cigarettes. Remember that withdrawal symptoms are easier to treat than the major diseases that smoking can cause.  Even after the withdrawal is over, expect periodic urges to smoke. However, these cravings are generally short lived and will go away whether you smoke or not. Do not smoke! WHAT RESOURCES ARE AVAILABLE TO HELP ME QUIT SMOKING? Your health care provider can direct you to community resources or hospitals for support, which may include:  Group support.  Education.  Hypnosis.  Therapy.   This information is  not intended to replace advice given to you by your health care provider. Make sure you discuss any questions you have with your health care provider.   Document Released: 06/27/2004 Document Revised: 10/20/2014 Document Reviewed: 03/17/2013 Elsevier Interactive Patient Education [employer].

## 2016-02-29 LAB — HEPATITIS B SURFACE ANTIGEN: HEP B S AG: NEGATIVE

## 2016-02-29 LAB — RPR

## 2016-02-29 LAB — HEPATITIS C ANTIBODY: HCV Ab: NEGATIVE

## 2016-02-29 LAB — HIV ANTIBODY (ROUTINE TESTING W REFLEX): HIV 1&2 Ab, 4th Generation: NONREACTIVE

## 2016-03-03 LAB — CYTOLOGY - PAP

## 2016-03-05 ENCOUNTER — Telehealth: Payer: Self-pay | Admitting: *Deleted

## 2016-03-05 NOTE — Telephone Encounter (Signed)
Pt requesting results from previous visit, all WDL.

## 2017-04-02 IMAGING — US US MFM OB FOLLOW-UP
1 series · 14 of 28 positions shown · non-contrast
Comparison: none

[Series 1: us mfm ob follow-up · 50 acquisitions, 14 frames shown]
[im 2/50]
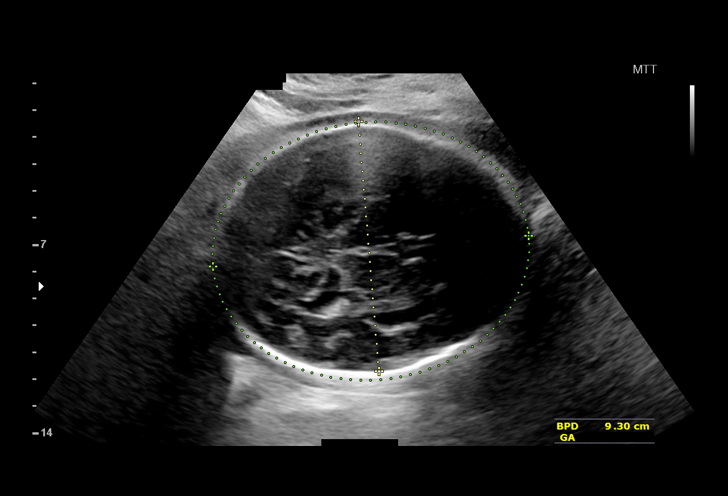
[im 6/50]
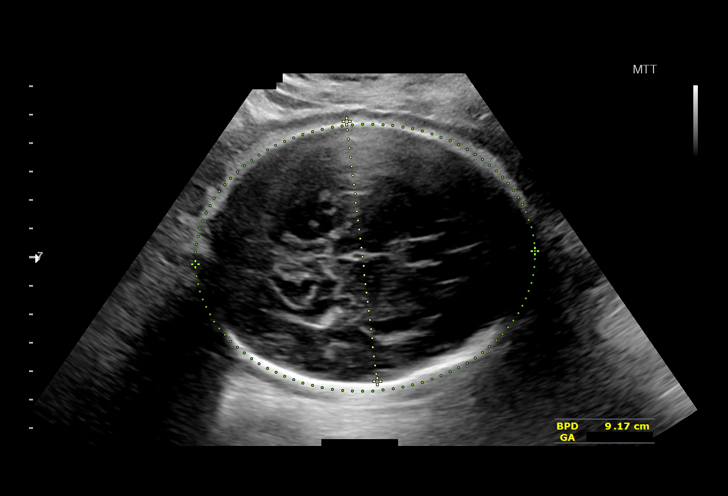
[im 10/50]
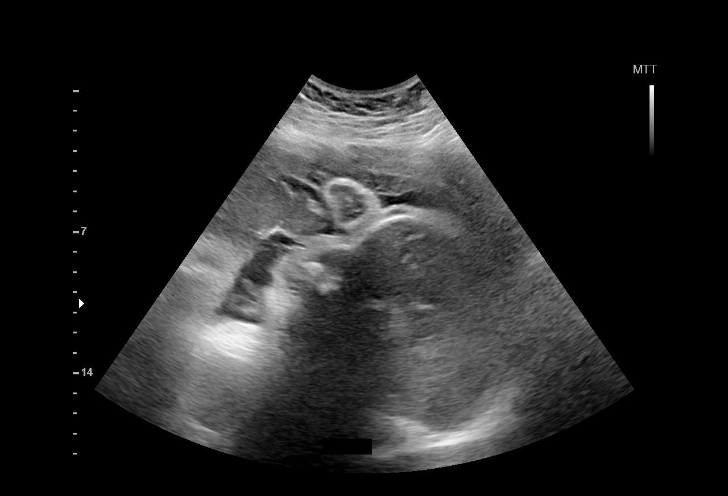
[im 13/50]
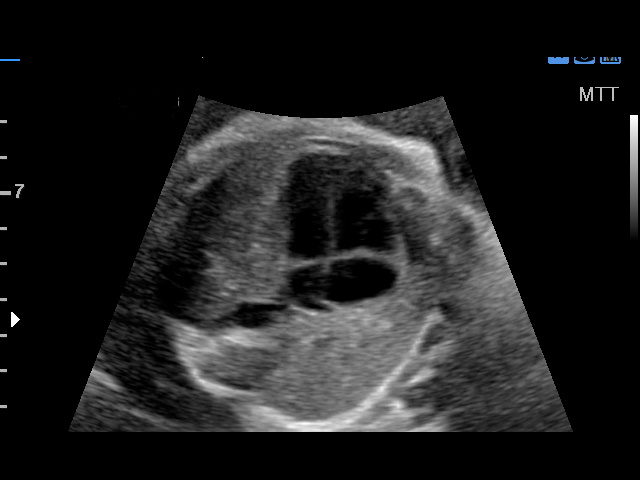
[im 17/50]
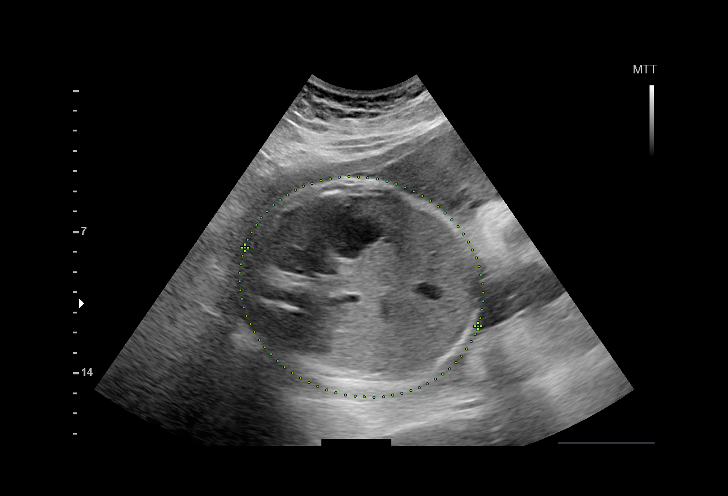
[im 20/50]
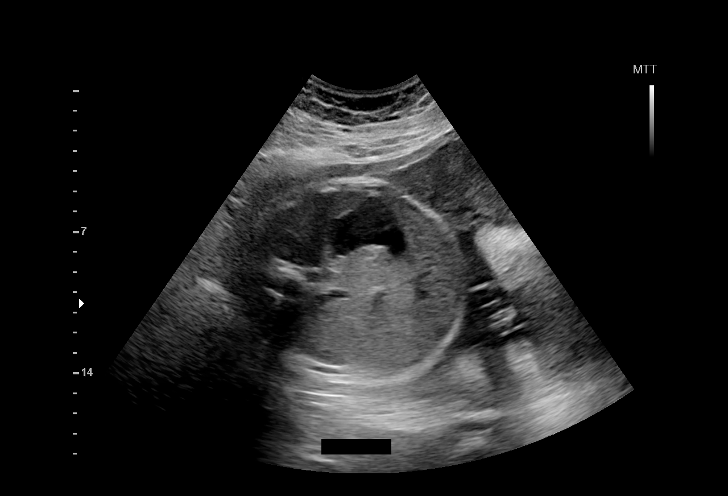
[im 24/50]
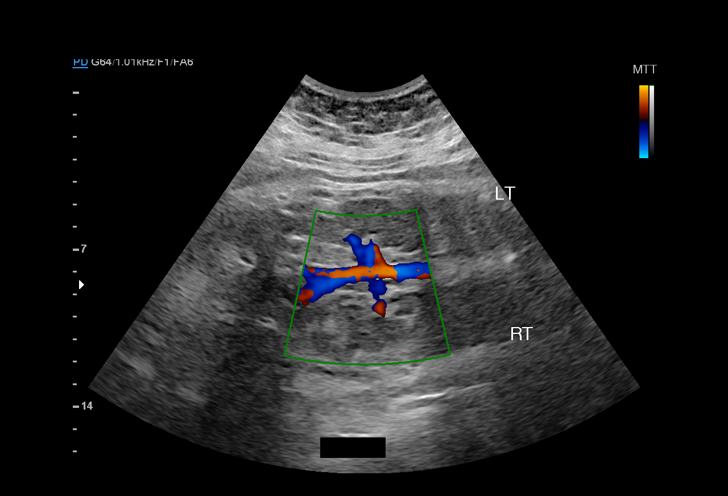
[im 28/50]
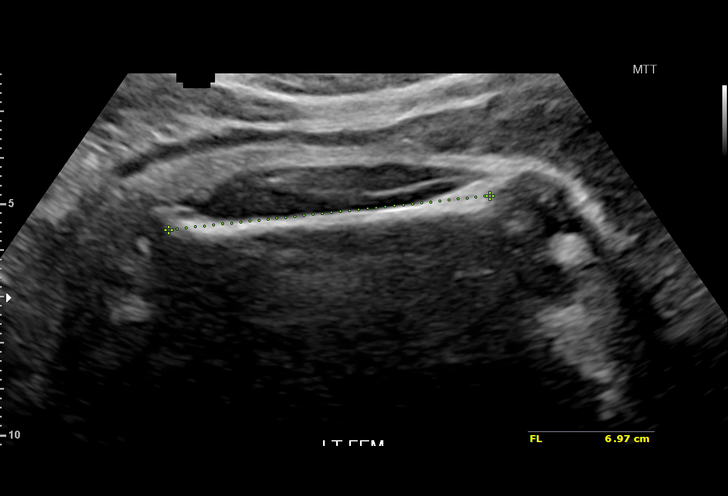
[im 31/50]
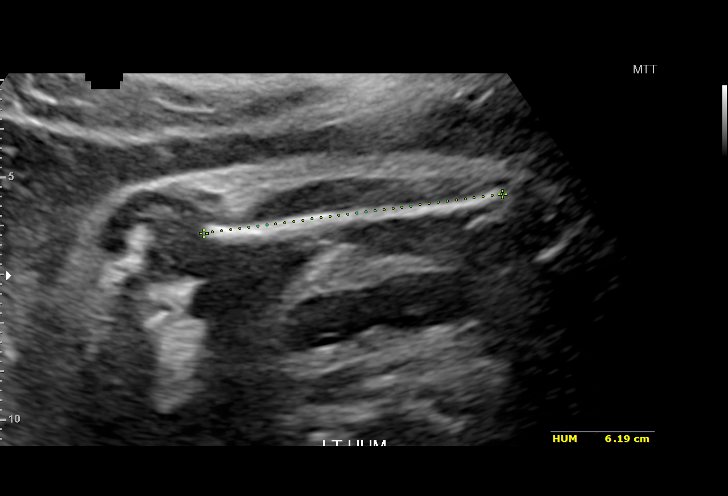
[im 35/50]
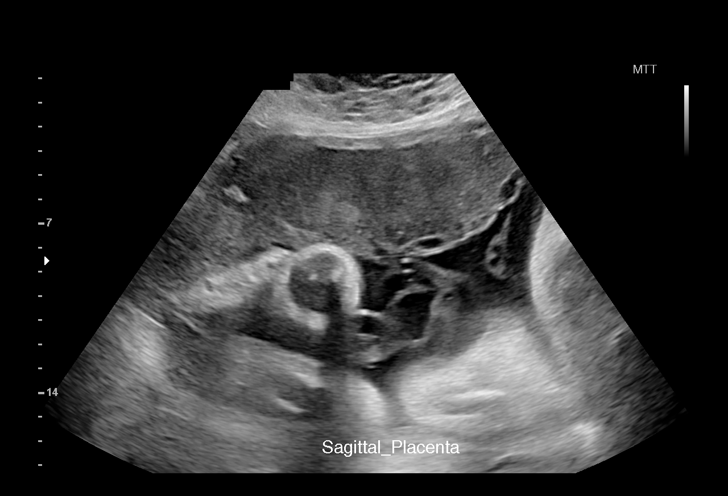
[im 39/50]
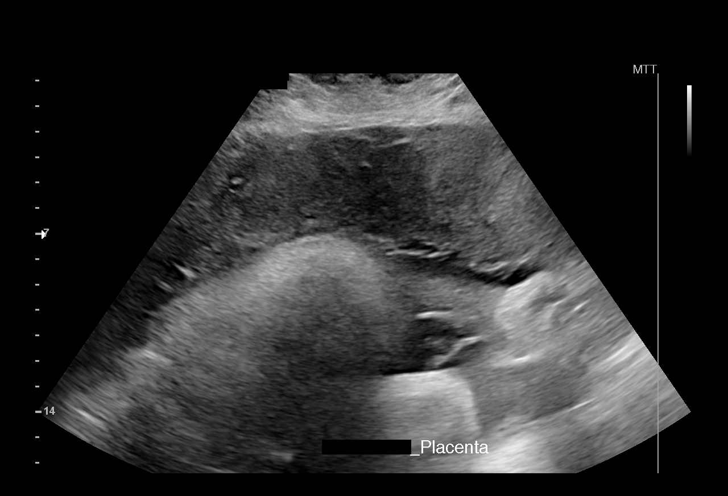
[im 42/50]
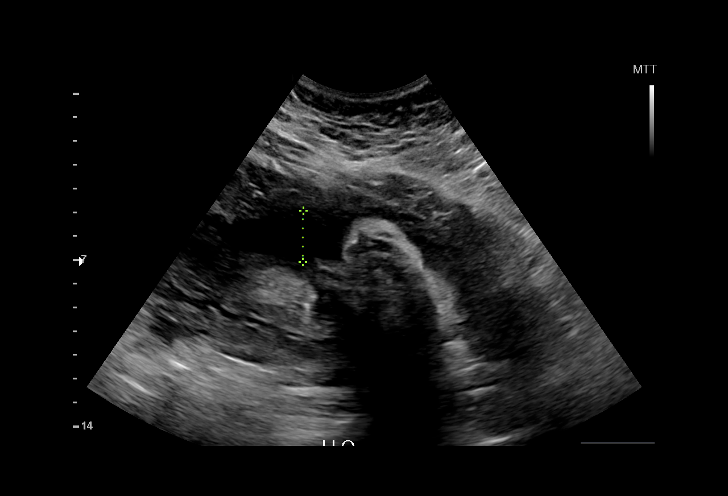
[im 46/50]
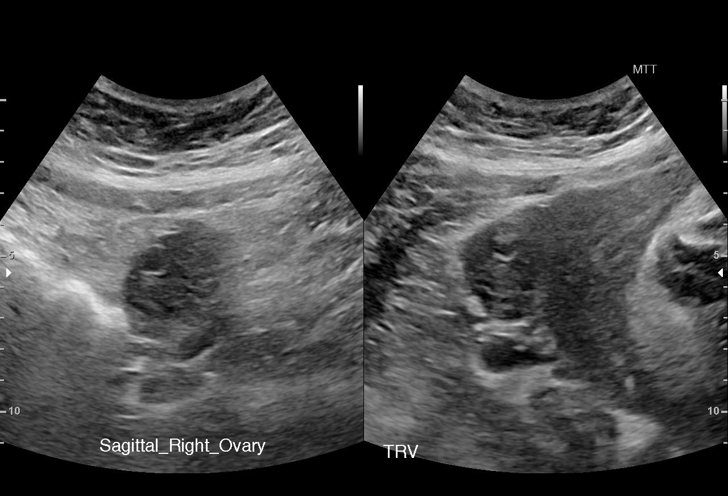
[im 50/50]
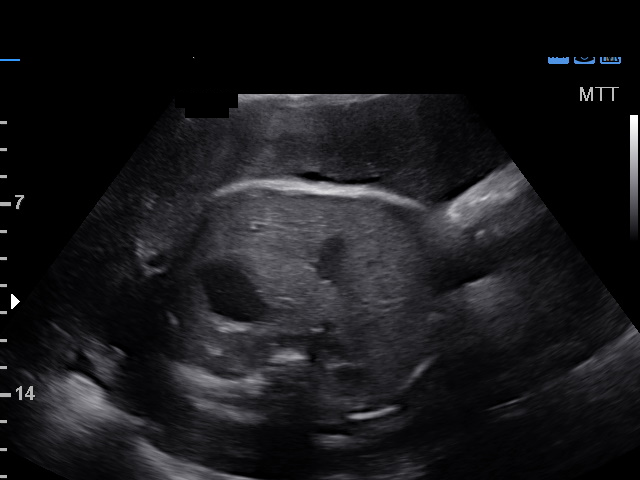

[14 of 28 positions shown; findings below may reference images not displayed]

OBSTETRICS REPORT
(Signed Final 07/24/2015 [DATE])

Service(s) Provided

Indications

Maternal morbid obesity, BMI:
Previous cesarean delivery, antepartum
Poor obstetric history: Previous fetal growth
restriction (5+10 at 41 weeks)
36 weeks gestation of pregnancy
Fetal Evaluation

Num Of Fetuses:    1
Fetal Heart Rate:  135                          bpm
Cardiac Activity:  Observed
Presentation:      Cephalic
Placenta:          Anterior, above cervical os
P. Cord            Previously Visualized
Insertion:

Amniotic Fluid
AFI FV:      Subjectively within normal limits
AFI Sum:     10.35   cm       25  %Tile     Larg Pckt:     3.7  cm
RUQ:   3.49    cm   RLQ:    1.01   cm    LUQ:   3.7     cm   LLQ:    2.15   cm
Biometry

BPD:     92.1  mm     G. Age:  37w 3d                CI:        75.74   70 - 86
FL/HC:      20.5   20.1 -
22.1
HC:     335.5  mm     G. Age:  38w 3d       77  %    HC/AC:      0.94   0.93 -
1.11
AC:       358  mm     G. Age:  39w 5d     > 97  %    FL/BPD:     74.6   71 - 87
FL:      68.7  mm     G. Age:  35w 2d       27  %    FL/AC:      19.2   20 - 24
HUM:     61.7  mm     G. Age:  35w 5d       58  %
Est. FW:    5555  gm    7 lb 10 oz    > 90  %
Gestational Age

LMP:           34w 2d        Date:  11/25/14                 EDD:   09/01/15
U/S Today:     37w 5d                                        EDD:   08/08/15
Best:          36w 0d     Det. By:  Early Ultrasound         EDD:   08/20/15
(01/16/15)
Anatomy

Cranium:          Appears normal         Aortic Arch:      Previously seen
Fetal Cavum:      Previously seen        Ductal Arch:      Previously seen
Ventricles:       Appears normal         Diaphragm:        Appears normal
Choroid Plexus:   Previously seen        Stomach:          Appears normal, left
sided
Cerebellum:       Previously seen        Abdomen:          Previously seen
Posterior Fossa:  Previously seen        Abdominal Wall:   Previously seen
Nuchal Fold:      Not applicable (>20    Cord Vessels:     Previously seen
wks GA)
Face:             Orbits and profile     Kidneys:          Appear normal
previously seen
Lips:             Previously seen        Bladder:          Appears normal
Heart:            Appears normal         Spine:            Limited views
(4CH, axis, and                          previously seen
situs)
RVOT:             Appears normal         Lower             Previously seen
Extremities:
LVOT:             Appears normal         Upper             Previously seen
Extremities:

Other:  Male gender previously seen. Heels previously seen. Technically
difficult due to maternal habitus and fetal position.
Cervix Uterus Adnexa

Cervix:       Not visualized (advanced GA >24wks)
Uterus:       No abnormality visualized.
Cul De Sac:   No free fluid seen.
Left Ovary:    Within normal limits.
Right Ovary:   Within normal limits.

Adnexa:     No abnormality visualized.
Impression

Single IUP at 36w 0d in gestation compicated by maternal
obesity (remote read only)
Interval fetal anatomy appears without defect noting limitation
as detailed above
EFW >90th%'le
Normal amniotic fluid volume
No previa
Recommendations

Would repeat interval growth in 3-4 weeks if undelivered by
that time.
Otherwise, follow up as clinically indicated.

questions or concerns.

## 2017-07-22 ENCOUNTER — Ambulatory Visit (INDEPENDENT_AMBULATORY_CARE_PROVIDER_SITE_OTHER): Payer: 59 | Admitting: Student

## 2017-07-22 ENCOUNTER — Encounter: Payer: Self-pay | Admitting: Student

## 2017-07-22 VITALS — BP 133/80 | HR 81 | Wt 329.0 lb

## 2017-07-22 DIAGNOSIS — Z01419 Encounter for gynecological examination (general) (routine) without abnormal findings: Secondary | ICD-10-CM | POA: Diagnosis not present

## 2017-07-22 DIAGNOSIS — F3289 Other specified depressive episodes: Secondary | ICD-10-CM

## 2017-07-22 DIAGNOSIS — Z113 Encounter for screening for infections with a predominantly sexual mode of transmission: Secondary | ICD-10-CM

## 2017-07-22 DIAGNOSIS — Z124 Encounter for screening for malignant neoplasm of cervix: Secondary | ICD-10-CM | POA: Diagnosis not present

## 2017-07-23 DIAGNOSIS — F329 Major depressive disorder, single episode, unspecified: Secondary | ICD-10-CM | POA: Insufficient documentation

## 2017-07-23 DIAGNOSIS — Z01419 Encounter for gynecological examination (general) (routine) without abnormal findings: Secondary | ICD-10-CM | POA: Insufficient documentation

## 2017-07-23 DIAGNOSIS — F32A Depression, unspecified: Secondary | ICD-10-CM | POA: Insufficient documentation

## 2017-07-23 LAB — HIV ANTIBODY (ROUTINE TESTING W REFLEX): HIV Screen 4th Generation wRfx: NONREACTIVE

## 2017-07-23 NOTE — Progress Notes (Signed)
Subjective:     Patient ID: Betty Shelton, female   DOB: 1986/06/17, 31 y.o.   MRN: [id number]  HPI Patient Betty Shelton is a 31 y.o. [id number] non-pregnant Betty Shelton) female here for STI testing. Patient has recently changed partners and wants to get tested. She denies vaginal discharge, odor, itching, dysuria or other ob-gyn complaints.  Patient became teary when asked how she is doing as a newly single mom. Patient expresses some distress over maintaining her household on her own; she feels like she is depressed and requests services.  Patient is also frustrated over recent weight gain and her poor eating habits.  Review of Systems  HENT: Negative.   Eyes: Negative.   Gastrointestinal: Negative.   Genitourinary: Negative.   Musculoskeletal: Negative.   Neurological: Negative.   Hematological: Negative.        Objective:   Physical Exam  Constitutional: She is oriented to person, place, and time. She appears well-developed.  HENT:  Head: Normocephalic.  Pulmonary/Chest: Effort normal.  Abdominal: Soft.  Genitourinary: Vagina normal.  Musculoskeletal: Normal range of motion.  Neurological: She is alert and oriented to person, place, and time. She has normal reflexes.  Skin: Skin is warm and dry.  Psychiatric: She has a normal mood and affect.  Breast exam benign: no lumps or masses palpated.  NEFG: no discharge in the vagina. No lesions on the cervix; no CMT, adnexal or suprapubic tenderness. Skyla strings palpated.     Assessment:     Healthy well woman exam with depressed mood.     Plan:     1.Pap with GC CT and Trich,  2. HIV and RPRP 3. Referral made to psychotherapy services. Support and encouragement given for diet and exercise changes.  4., Encouraged patient to call if she feels she develops STI symptoms; let her know that we will call her if any of her results come back positive.  Luna Kitchens CNM

## 2017-07-24 LAB — CYTOLOGY - PAP
Bacterial vaginitis: POSITIVE — AB
CHLAMYDIA, DNA PROBE: NEGATIVE
Candida vaginitis: POSITIVE — AB
Diagnosis: NEGATIVE
NEISSERIA GONORRHEA: NEGATIVE
TRICH (WINDOWPATH): NEGATIVE

## 2017-07-27 ENCOUNTER — Other Ambulatory Visit: Payer: Self-pay | Admitting: Student

## 2017-07-27 ENCOUNTER — Telehealth: Payer: Self-pay

## 2017-07-27 MED ORDER — FLUCONAZOLE 150 MG PO TABS: 150.0000 mg | ORAL_TABLET | Freq: Every day | ORAL | 3 refills | Status: AC

## 2017-07-27 MED ORDER — METRONIDAZOLE 500 MG PO TABS: 500.0000 mg | ORAL_TABLET | Freq: Two times a day (BID) | ORAL | 0 refills | Status: DC

## 2017-07-27 NOTE — Telephone Encounter (Signed)
-----   Message from Marylene Land, CNM sent at 07/27/2017  3:11 AM EDT ----- Regarding: please call and notify of her yeast and BV Hello! Do you mind calling Ms. Vanleeuwen and telling her that her results came back positive for yeast and BV. Her other STI tests were negative.  Thank you so much!  Samara Deist

## 2017-07-27 NOTE — Telephone Encounter (Signed)
Call patient to inform of lab results. I have left a voicemail for patient to call us back regarding lab results.

## 2018-03-08 ENCOUNTER — Emergency Department (HOSPITAL_COMMUNITY)
Admission: EM | Admit: 2018-03-08 | Discharge: 2018-03-08 | Disposition: A | Discharge status: Home / Self Care | Payer: 59 | Attending: Emergency Medicine | Admitting: Emergency Medicine

## 2018-03-08 ENCOUNTER — Other Ambulatory Visit: Payer: Self-pay

## 2018-03-08 ENCOUNTER — Encounter (HOSPITAL_COMMUNITY): Payer: Self-pay

## 2018-03-08 DIAGNOSIS — F17210 Nicotine dependence, cigarettes, uncomplicated: Secondary | ICD-10-CM | POA: Insufficient documentation

## 2018-03-08 DIAGNOSIS — Z113 Encounter for screening for infections with a predominantly sexual mode of transmission: Secondary | ICD-10-CM | POA: Diagnosis not present

## 2018-03-08 DIAGNOSIS — B373 Candidiasis of vulva and vagina: Secondary | ICD-10-CM

## 2018-03-08 DIAGNOSIS — Z79899 Other long term (current) drug therapy: Secondary | ICD-10-CM | POA: Diagnosis not present

## 2018-03-08 DIAGNOSIS — Z711 Person with feared health complaint in whom no diagnosis is made: Secondary | ICD-10-CM | POA: Diagnosis not present

## 2018-03-08 DIAGNOSIS — N3000 Acute cystitis without hematuria: Secondary | ICD-10-CM | POA: Diagnosis not present

## 2018-03-08 DIAGNOSIS — B3731 Acute candidiasis of vulva and vagina: Secondary | ICD-10-CM

## 2018-03-08 DIAGNOSIS — N898 Other specified noninflammatory disorders of vagina: Secondary | ICD-10-CM | POA: Diagnosis present

## 2018-03-08 LAB — URINALYSIS, ROUTINE W REFLEX MICROSCOPIC
Bilirubin Urine: NEGATIVE
GLUCOSE, UA: NEGATIVE mg/dL
Ketones, ur: NEGATIVE mg/dL
NITRITE: NEGATIVE
Protein, ur: 30 mg/dL — AB
RBC / HPF: >50 RBC/hpf — ABNORMAL HIGH (ref 0–5)
SPECIFIC GRAVITY, URINE: 1.011 (ref 1.005–1.030)
WBC, UA: >50 WBC/hpf — ABNORMAL HIGH (ref 0–5)
pH: 6 (ref 5.0–8.0)

## 2018-03-08 LAB — WET PREP, GENITAL
Sperm: NONE SEEN
Trich, Wet Prep: NONE SEEN

## 2018-03-08 LAB — PREGNANCY, URINE: Preg Test, Ur: NEGATIVE

## 2018-03-08 MED ORDER — AZITHROMYCIN 250 MG PO TABS
1000.0000 mg | ORAL_TABLET | Freq: Once | ORAL | Status: AC
  Administered 2018-03-08: 1000 mg via ORAL
  Filled 2018-03-08: qty 4

## 2018-03-08 MED ORDER — ONDANSETRON 8 MG PO TBDP
8.0000 mg | ORAL_TABLET | Freq: Once | ORAL | Status: AC
  Administered 2018-03-08: 8 mg via ORAL
  Filled 2018-03-08: qty 1

## 2018-03-08 MED ORDER — FLUCONAZOLE 150 MG PO TABS: 150.0000 mg | ORAL_TABLET | Freq: Once | ORAL | 0 refills | Status: AC

## 2018-03-08 MED ORDER — CEPHALEXIN 500 MG PO CAPS: 500.0000 mg | ORAL_CAPSULE | Freq: Four times a day (QID) | ORAL | 0 refills | Status: DC

## 2018-03-08 MED ORDER — CEFTRIAXONE SODIUM 250 MG IJ SOLR
250.0000 mg | Freq: Once | INTRAMUSCULAR | Status: AC
  Administered 2018-03-08: 250 mg via INTRAMUSCULAR
  Filled 2018-03-08: qty 250

## 2018-03-08 MED ORDER — METRONIDAZOLE 500 MG PO TABS
2000.0000 mg | ORAL_TABLET | Freq: Once | ORAL | Status: AC
  Administered 2018-03-08: 2000 mg via ORAL
  Filled 2018-03-08: qty 4

## 2018-03-08 MED ORDER — NYSTATIN 100000 UNIT/GM EX CREA: 1.0000 "application " | TOPICAL_CREAM | Freq: Two times a day (BID) | CUTANEOUS | 0 refills | Status: DC

## 2018-03-08 NOTE — Discharge Instructions (Addendum)
1. Medications: Diflucan, Keflex, nystatin, usual home medications 2. Treatment: rest, drink plenty of fluids, use a condom with every sexual encounter 3. Follow Up: Please followup with your primary doctor in 3 days for discussion of your diagnoses and further evaluation after today's visit; if you do not have a primary care doctor use the resource guide provided to find one; Please return to the ER for worsening symptoms, high fevers or persistent vomiting.  You have been tested for HIV, syphilis, chlamydia and gonorrhea.  These results will be available in approximately 3 days.  Please inform all sexual partners if you test positive for any of these diseases.

## 2018-03-08 NOTE — ED Triage Notes (Signed)
Pt states she's been having pain when she's at the end of emptying her bladder since 03/04/18 (and had STD check at health dept that day).  States she last had sex w/her partner on 02/27/18 and often gets an STD from him so she feels it may possibly be another STD. Denies any other sx's.

## 2018-03-08 NOTE — ED Provider Notes (Signed)
[facility]-EMERGENCY DEPT Provider Note   CSN: [id number] Arrival date & time: 03/08/18  2041     History   Chief Complaint Chief Complaint  Patient presents with  . Dysuria  . Vaginal Itching  . Vaginal Discharge    HPI Betty Shelton is a 32 y.o. female with a hx of obesity presents to the Emergency Department complaining of gradual, persistent, progressively worsening vaginal discharge, dysuria, vaginal itching onset approximately 5 days ago.  Patient reports she was evaluated for the vaginal itching and discharge at the health department 5 days ago.  She reports she has not yet received her results from her STD testing.  She states she was recently sexually active with an old boyfriend and she reports a history of contracting an STD and sometimes a UTI every time she has sex with him.  She reports symptoms today are similar to all of her previous symptoms.  She states dysuria began 2 days ago and is worse at the end of urination.  She has taken Azo and cranberry juice without relief.  She denies fevers or chills, nausea or vomiting, abdominal pain, weakness, dizziness, syncope.  Urination makes her pain worse.  Nothing seems to make her symptoms better.  Mucus  The history is provided by the patient and medical records. No language interpreter was used.    Past Medical History:  Diagnosis Date  . Medical history non-contributory   . Morbid obesity with BMI of 50.0-59.9, adult [facility])     Patient Active Problem List   Diagnosis Date Noted  . Depression 07/23/2017  . Women's annual routine gynecological examination 07/23/2017  . Smoker 02/28/2016  . Morbid obesity (HCC) 01/02/2014    Past Surgical History:  Procedure Laterality Date  . CESAREAN SECTION    . CESAREAN SECTION N/A 08/16/2015   Procedure: CESAREAN SECTION;  Surgeon: Tilda Burrow, MD;  Location: [facility] ORS;  Service: Obstetrics;  Laterality: N/A;  . IUD REMOVAL N/A 11/28/2013   Procedure:  INTRAUTERINE DEVICE (IUD) REMOVAL;  Surgeon: Allie Bossier, MD;  Location: [facility] ORS;  Service: Gynecology;  Laterality: N/A;  . TOOTH EXTRACTION  2014  . WISDOM TOOTH EXTRACTION       OB History    Gravida  3   Para  2   Term  2   Preterm  0   AB  1   Living  2     SAB  1   TAB  0   Ectopic  0   Multiple  0   Live Births  2            Home Medications    Prior to Admission medications   Medication Sig Start Date End Date Taking? Authorizing Provider  cephALEXin (KEFLEX) 500 MG capsule Take 1 capsule (500 mg total) by mouth 4 (four) times daily. 03/08/18   Jaleah Lefevre, Dahlia Client, PA-C  fluconazole (DIFLUCAN) 150 MG tablet Take 1 tablet (150 mg total) by mouth once for 1 dose. 03/08/18 03/08/18  Renaud Celli, Dahlia Client, PA-C  Levonorgestrel (SKYLA) 13.5 MG IUD by Intrauterine route.    [provider]  metroNIDAZOLE (FLAGYL) 500 MG tablet Take 1 tablet (500 mg total) by mouth 2 (two) times daily. 07/27/17   Marylene Land, CNM  nystatin cream (MYCOSTATIN) Apply 1 application topically 2 (two) times daily. 03/08/18   Illene Sweeting, Dahlia Client, PA-C    Family History Family History  Problem Relation Age of Onset  . Diabetes Mother   .  Cancer Maternal Aunt        breast  . Diabetes Maternal Grandmother   . Hypertension Maternal Grandmother     Social History Social History   Tobacco Use  . Smoking status: Current Every Day Smoker    Packs/day: 0.25    Years: 8.00    Pack years: 2.00    Types: Cigarettes  . Smokeless tobacco: Never Used  Substance Use Topics  . Alcohol use: Yes    Comment: socially  . Drug use: No     Allergies   Mucinex [guaifenesin er]   Review of Systems Review of Systems  Constitutional: Negative for appetite change, diaphoresis, fatigue, fever and unexpected weight change.  HENT: Negative for mouth sores.   Eyes: Negative for visual disturbance.  Respiratory: Negative for cough, chest tightness, shortness of breath  and wheezing.   Cardiovascular: Negative for chest pain.  Gastrointestinal: Negative for abdominal pain, constipation, diarrhea, nausea and vomiting.  Endocrine: Negative for polydipsia, polyphagia and polyuria.  Genitourinary: Positive for dysuria and vaginal discharge. Negative for frequency, hematuria and urgency.  Musculoskeletal: Negative for back pain and neck stiffness.  Skin: Negative for rash.  Allergic/Immunologic: Negative for immunocompromised state.  Neurological: Negative for syncope, light-headedness and headaches.  Hematological: Does not bruise/bleed easily.  Psychiatric/Behavioral: Negative for sleep disturbance. The patient is not nervous/anxious.      Physical Exam Updated Vital Signs BP 127/69 (BP Location: Left Arm)   Pulse 66   Temp 98.9 F (37.2 C) (Oral)   Resp (!) 22   Ht  (1.651 m)   Wt (!) 143.3 kg (316 lb)   SpO2 97%   BMI 52.59 kg/m   Physical Exam  Constitutional: She appears well-developed and well-nourished. No distress.  HENT:  Head: Normocephalic and atraumatic.  Eyes: Conjunctivae are normal.  Neck: Normal range of motion.  Cardiovascular: Normal rate, regular rhythm, normal heart sounds and intact distal pulses.  No murmur heard. Pulmonary/Chest: Effort normal and breath sounds normal. No respiratory distress. She has no wheezes.  Abdominal: Soft. Bowel sounds are normal. There is no tenderness. There is no rebound and no guarding. Hernia confirmed negative in the right inguinal area and confirmed negative in the left inguinal area.  Genitourinary: Uterus normal. No labial fusion. There is no rash, tenderness or lesion on the right labia. There is no rash, tenderness or lesion on the left labia. Uterus is not deviated, not enlarged, not fixed and not tender. Cervix exhibits no motion tenderness, no discharge and no friability. Right adnexum displays no mass, no tenderness and no fullness. Left adnexum displays no mass, no tenderness and  no fullness. No erythema, tenderness or bleeding in the vagina. No foreign body in the vagina. No signs of injury around the vagina. Vaginal discharge (moderate amount of thick, brown, malodorous discharge) found.  Musculoskeletal: Normal range of motion. She exhibits no edema.  Lymphadenopathy:       Right: No inguinal adenopathy present.       Left: No inguinal adenopathy present.  Neurological: She is alert.  Skin: Skin is warm and dry. She is not diaphoretic. No erythema.  Psychiatric: She has a normal mood and affect.  Nursing note and vitals reviewed.    ED Treatments / Results  Labs (all labs ordered are listed, but only abnormal results are displayed) Labs Reviewed  WET PREP, GENITAL - Abnormal; Notable for the following components:      Result Value   Yeast Wet Prep HPF POC FEW (*)  Clue Cells Wet Prep HPF POC PRESENT (*)    WBC, Wet Prep HPF POC MANY (*)    All other components within normal limits  URINALYSIS, ROUTINE W REFLEX MICROSCOPIC - Abnormal; Notable for the following components:   APPearance CLOUDY (*)    Hgb urine dipstick LARGE (*)    Protein, ur 30 (*)    Leukocytes, UA LARGE (*)    RBC / HPF >50 (*)    WBC, UA >50 (*)    Bacteria, UA RARE (*)    All other components within normal limits  URINE CULTURE  PREGNANCY, URINE  GC/CHLAMYDIA PROBE AMP ([place]) NOT AT [facility]    Procedures Procedures (including critical care time)  Medications Ordered in ED Medications  ondansetron (ZOFRAN-ODT) disintegrating tablet 8 mg (8 mg Oral Given 03/08/18 2322)  cefTRIAXone (ROCEPHIN) injection 250 mg (250 mg Intramuscular Given 03/08/18 2326)  azithromycin (ZITHROMAX) tablet 1,000 mg (1,000 mg Oral Given 03/08/18 2328)  metroNIDAZOLE (FLAGYL) tablet 2,000 mg (2,000 mg Oral Given 03/08/18 2326)     Initial Impression / Assessment and Plan / ED Course  I have reviewed the triage vital signs and the nursing notes.  Pertinent labs & imaging results that were  available during my care of the patient were reviewed by me and considered in my medical decision making (see chart for details).     Pt presents with concerns for possible STD.  Pt has been treated prophylactically with azithromycin and Rocephin due to pts history, pelvic exam, and wet prep with increased WBCs.  Pt not concerning for PID because hemodynamically stable and no cervical motion tenderness on pelvic exam. Pt has also been treated with Flagyl for Hx of trich. Pt has been advised to not drink alcohol while on this medication.  UA with signs of UTI and pt is symptomatic.  Urine culture sent.  Pt will be d/c home with keflex.  Clinical exam concerning for concurrent yeast vaginitis. Pt will be treated for this as well.  Patient to be discharged with instructions to follow up with OBGYN/PCP. Discussed importance of using protection when sexually active.  Pt understands that they have GC/Chlamydia cultures pending and that they will need to inform all sexual partners if results return positive.   Final Clinical Impressions(s) / ED Diagnoses   Final diagnoses:  Concern about STD in female without diagnosis  Yeast vaginitis  Acute cystitis without hematuria    ED Discharge Orders        Ordered    fluconazole (DIFLUCAN) 150 MG tablet   Once     03/08/18 2303    nystatin cream (MYCOSTATIN)  2 times daily     03/08/18 2303    cephALEXin (KEFLEX) 500 MG capsule  4 times daily     03/08/18 2304       Enda Santo, Boyd Kerbs 03/08/18 2356    Charlynne Pander, MD 03/10/18 1515

## 2018-03-09 LAB — GC/CHLAMYDIA PROBE AMP (~~LOC~~) NOT AT ARMC
CHLAMYDIA, DNA PROBE: NEGATIVE
Neisseria Gonorrhea: NEGATIVE

## 2018-03-11 LAB — URINE CULTURE: Culture: >=100000 — AB

## 2018-03-12 ENCOUNTER — Telehealth: Payer: Self-pay | Admitting: *Deleted

## 2018-03-12 NOTE — Telephone Encounter (Signed)
Post ED Visit - Positive Culture Follow-up: Unsuccessful Patient Follow-up  Culture assessed and recommendations reviewed by:   Enzo Bi, Pharm.D.  Celedonio Miyamoto, Pharm.D., BCPS AQ-ID  Garvin Fila, Pharm.D., BCPS  Georgina Pillion, Pharm.D., BCPS  [place], [place].D., BCPS, AAHIVP  Estella Husk, Pharm.D., BCPS, AAHIVP  Sherlynn Carbon, PharmD  Pollyann Samples, PharmD, BCPS  Positive urine culture   Patient discharged without antimicrobial prescription and treatment is now indicated  Organism is resistant to prescribed ED discharge antimicrobial  Patient with positive blood cultures  Evaluated by Harvie Heck, PA-C.  Advised to take Cephalexin BID through June 3 and then discard remainder.  Unable to contact patient after 3 attempts, letter will be sent to address on file  Lysle Pearl 03/12/2018, 10:12 AM

## 2018-03-18 ENCOUNTER — Telehealth: Payer: Self-pay | Admitting: *Deleted

## 2018-03-18 NOTE — Telephone Encounter (Signed)
Received call from patient who states UTI symptoms are resoloved and antibiotic therapy was completed.

## 2018-04-28 ENCOUNTER — Other Ambulatory Visit: Payer: Self-pay

## 2018-04-28 ENCOUNTER — Telehealth: Payer: Self-pay

## 2018-04-28 MED ORDER — METRONIDAZOLE 500 MG PO TABS: 500.0000 mg | ORAL_TABLET | Freq: Two times a day (BID) | ORAL | 0 refills | Status: DC

## 2018-04-28 MED ORDER — BORIC ACID CRYS: 600.0000 mg | CRYSTALS | Freq: Two times a day (BID) | 2 refills | Status: DC

## 2018-04-28 MED ORDER — FLUCONAZOLE 150 MG PO TABS: 150.0000 mg | ORAL_TABLET | Freq: Once | ORAL | 0 refills | Status: AC

## 2018-04-28 NOTE — Telephone Encounter (Signed)
Patient thinks she has a yeast infection and would like a diflucan called into for [facility] on [address]. Patient is also requesting a refill on boric acid she reports this has help with her BV in the past.

## 2018-04-28 NOTE — Telephone Encounter (Signed)
Yes. Also offer her the standard treatment with Metronidazole.

## 2018-04-28 NOTE — Telephone Encounter (Signed)
Called patient to inform her Dr.Anyanwu would be able to called in some boric acid for her take. She would also like for her to take a course metronidazole 500 mg to help with her symptoms. Left message for patient to call back.

## 2018-04-28 NOTE — Telephone Encounter (Signed)
Patient is requesting a refill on boric acid. She reports this has help her in the past with BV. Are you ok with me seeing [facility]?

## 2018-04-28 NOTE — Telephone Encounter (Signed)
-----   Message from Lindell SparHeather L Bacon, [place] sent at 04/28/2018 10:48 AM EDT ----- Regarding: pt requesting a call back  Please call patient about getting a refill

## 2018-10-07 ENCOUNTER — Encounter: Payer: Self-pay | Admitting: Student

## 2018-10-07 ENCOUNTER — Ambulatory Visit (INDEPENDENT_AMBULATORY_CARE_PROVIDER_SITE_OTHER): Payer: 59 | Admitting: Student

## 2018-10-07 VITALS — BP 127/70 | HR 76 | Ht 65.0 in | Wt 323.6 lb

## 2018-10-07 DIAGNOSIS — Z1151 Encounter for screening for human papillomavirus (HPV): Secondary | ICD-10-CM

## 2018-10-07 DIAGNOSIS — Z124 Encounter for screening for malignant neoplasm of cervix: Secondary | ICD-10-CM

## 2018-10-07 DIAGNOSIS — Z01419 Encounter for gynecological examination (general) (routine) without abnormal findings: Secondary | ICD-10-CM | POA: Diagnosis not present

## 2018-10-07 DIAGNOSIS — Z113 Encounter for screening for infections with a predominantly sexual mode of transmission: Secondary | ICD-10-CM

## 2018-10-07 NOTE — Patient Instructions (Signed)
Pap Test  Why am I having this test?  A Pap test, also called a Pap smear, is a screening test to check for signs of:  · Cancer of the vagina, cervix, and uterus. The cervix is the lower part of the uterus that opens into the vagina.  · Infection.  · Changes that may be a sign that cancer is developing (precancerous changes).  Women need this test on a regular basis. In general, you should have a Pap test every 3 years until you reach menopause or age 30. Women aged 30-60 may choose to have their Pap test done at the same time as an HPV (human papillomavirus) test every 5 years (instead of every 3 years).  Your health care provider may recommend having Pap tests more or less often depending on your medical conditions and past Pap test results.  What kind of sample is taken?    Your health care provider will collect a sample of cells from the surface of your cervix. This will be done using a small cotton swab, plastic spatula, or brush. This sample is often collected during a pelvic exam, when you are lying on your back on an exam table with feet in footrests (stirrups).  In some cases, fluids (secretions) from the cervix or vagina may also be collected.  How do I prepare for this test?  · Be aware of where you are in your menstrual cycle. If you are menstruating on the day of the test, you may be asked to reschedule.  · You may need to reschedule if you have a known vaginal infection on the day of the test.  · Follow instructions from your health care provider about:  ? Changing or stopping your regular medicines. Some medicines can cause abnormal test results, such as digitalis and tetracycline.  ? Avoiding douching or taking a bath the day before or the day of the test.  Tell a health care provider about:  · Any allergies you have.  · All medicines you are taking, including vitamins, herbs, eye drops, creams, and over-the-counter medicines.  · Any blood disorders you have.  · Any surgeries you have had.  · Any  medical conditions you have.  · Whether you are pregnant or may be pregnant.  How are the results reported?  Your test results will be reported as either abnormal or normal.  A false-positive result can occur. A false positive is incorrect because it means that a condition is present when it is not.  A false-negative result can occur. A false negative is incorrect because it means that a condition is not present when it is.  What do the results mean?  A normal test result means that you do not have signs of cancer of the vagina, cervix, or uterus.  An abnormal result may mean that you have:  · Cancer. A Pap test by itself is not enough to diagnose cancer. You will have more tests done in this case.  · Precancerous changes in your vagina, cervix, or uterus.  · Inflammation of the cervix.  · An STD (sexually transmitted disease).  · A fungal infection.  · A parasite infection.  Talk with your health care provider about what your results mean.  Questions to ask your health care provider  Ask your health care provider, or the department that is doing the test:  · When will my results be ready?  · How will I get my results?  · What are my   treatment options?  · What other tests do I need?  · What are my next steps?  Summary  · In general, women should have a Pap test every 3 years until they reach menopause or age 30.  · Your health care provider will collect a sample of cells from the surface of your cervix. This will be done using a small cotton swab, plastic spatula, or brush.  · In some cases, fluids (secretions) from the cervix or vagina may also be collected.  This information is not intended to replace advice given to you by your health care provider. Make sure you discuss any questions you have with your health care provider.  Document Released: 12/20/2002 Document Revised: 06/08/2017 Document Reviewed: 06/08/2017  Elsevier Interactive Patient Education © 2019 [employer]

## 2018-10-07 NOTE — Progress Notes (Signed)
History:  Ms. Betty Shelton is a 32 y.o. [id number] who presents to clinic today for IUD removal, well-woman exam and STI testing. She has recently been tested for HIV and RPR; she is only concerned about Gonorrhea, trich and syphilis.   The following portions of the patient's history were reviewed and updated as appropriate: allergies, current medications, family history, past medical history, social history, past surgical history and problem list.  Review of Systems:  Review of Systems  Constitutional: Negative.   HENT: Negative.   Respiratory: Negative.   Cardiovascular: Negative.   Gastrointestinal: Negative.   Genitourinary: Negative.   Musculoskeletal: Negative.   Skin: Negative.   Neurological: Negative.       Objective:  Physical Exam BP 127/70   Pulse 76   Ht 5\' 5"  (1.651 m)   Wt (!) 323 lb 9.6 oz (146.8 kg)   BMI 53.85 kg/m  Physical Exam Constitutional:      Appearance: Normal appearance.  HENT:     Head: Normocephalic.  Cardiovascular:     Rate and Rhythm: Normal rate and regular rhythm.  Pulmonary:     Effort: Pulmonary effort is normal.  Abdominal:     General: Abdomen is flat. Bowel sounds are normal. There is no distension.     Palpations: There is no mass.     Tenderness: There is no abdominal tenderness.  Genitourinary:    General: Normal vulva.     Vagina: No vaginal discharge.  Musculoskeletal: Normal range of motion.  Neurological:     Mental Status: She is alert.   Breast exam: No masses, lumps, tenderness , or dimpling.    Labs and Imaging No results found for this or any previous visit (from the past 24 hour(s)).  No results found.   Assessment & Plan:  1. Well woman exam  - Cytology - PAP( [facility]) With GC and trich testing  -IUD removed without incident; patient is planning to use condoms.   -return to clinic as needed.   Betty Shelton, Betty Shelton, CNM 10/07/2018 2:42 PM

## 2018-10-15 LAB — CYTOLOGY - PAP
Chlamydia: NEGATIVE
Diagnosis: NEGATIVE
HPV (WINDOPATH): NOT DETECTED
NEISSERIA GONORRHEA: NEGATIVE
Trichomonas: NEGATIVE

## 2019-07-26 ENCOUNTER — Other Ambulatory Visit: Payer: Self-pay

## 2019-07-26 ENCOUNTER — Encounter (HOSPITAL_COMMUNITY): Payer: Self-pay | Admitting: Emergency Medicine

## 2019-07-26 ENCOUNTER — Ambulatory Visit (HOSPITAL_COMMUNITY)
Admission: EM | Admit: 2019-07-26 | Discharge: 2019-07-26 | Disposition: A | Discharge status: Home / Self Care | Payer: 59 | Attending: Family Medicine | Admitting: Family Medicine

## 2019-07-26 DIAGNOSIS — L0291 Cutaneous abscess, unspecified: Secondary | ICD-10-CM

## 2019-07-26 MED ORDER — IBUPROFEN 800 MG PO TABS: 800.0000 mg | ORAL_TABLET | Freq: Three times a day (TID) | ORAL | 0 refills | Status: DC

## 2019-07-26 MED ORDER — SULFAMETHOXAZOLE-TRIMETHOPRIM 800-160 MG PO TABS: 1.0000 | ORAL_TABLET | Freq: Two times a day (BID) | ORAL | 0 refills | Status: AC

## 2019-07-26 MED ORDER — FLUCONAZOLE 150 MG PO TABS: 150.0000 mg | ORAL_TABLET | Freq: Every day | ORAL | 0 refills | Status: DC

## 2019-07-26 NOTE — Discharge Instructions (Addendum)
Take the antibiotic 2 times a day.  Take 2 doses today May take ibuprofen 3 times a day with food Take Diflucan if needed for yeast infection Warm compresses to areas Follow-up if needed

## 2019-07-26 NOTE — ED Triage Notes (Signed)
Pt has several abscesses on her body, one on each side of her groin and a few under her breasts. The abscess to the right groin opened and drained but the others have not.

## 2019-07-26 NOTE — ED Provider Notes (Signed)
[facility]    CSN: [id number] Arrival date & time: 07/26/19  1014      History   Chief Complaint Chief Complaint  Patient presents with  . Abscess    HPI Betty DOLINGER is a 33 y.o. female.   HPI  Long history of abscess recurring in axilla, under breasts and in groin.  Currently hs one under left abdominal pannus fold and int he groin.  Sometimes can manage at home with warm compresses, sometimes need drainage and/or antibiotics.  Not diabetic.  +runs in family.  +Obese and worse when weight is up.  Past Medical History:  Diagnosis Date  . Medical history non-contributory   . Morbid obesity with BMI of 50.0-59.9, adult [facility])     Patient Active Problem List   Diagnosis Date Noted  . Depression 07/23/2017  . Women's annual routine gynecological examination 07/23/2017  . Smoker 02/28/2016  . Morbid obesity (HCC) 01/02/2014    Past Surgical History:  Procedure Laterality Date  . CESAREAN SECTION    . CESAREAN SECTION N/A 08/16/2015   Procedure: CESAREAN SECTION;  Surgeon: Tilda Burrow, MD;  Location: [facility];  Service: Obstetrics;  Laterality: N/A;  . IUD REMOVAL N/A 11/28/2013   Procedure: INTRAUTERINE DEVICE (IUD) REMOVAL;  Surgeon: Allie Bossier, MD;  Location: [facility];  Service: Gynecology;  Laterality: N/A;  . TOOTH EXTRACTION  2014  . WISDOM TOOTH EXTRACTION      OB History    Gravida  3   Para  2   Term  2   Preterm  0   AB  1   Living  2     SAB  1   TAB  0   Ectopic  0   Multiple  0   Live Births  2            Home Medications    Prior to Admission medications   Medication Sig Start Date End Date Taking? Authorizing Provider  fluconazole (DIFLUCAN) 150 MG tablet Take 1 tablet (150 mg total) by mouth daily. Repeat in 1 week if needed 07/26/19   Eustace Moore, MD  ibuprofen (ADVIL) 800 MG tablet Take 1 tablet (800 mg total) by mouth 3 (three) times daily. 07/26/19   Eustace Moore, MD   sulfamethoxazole-trimethoprim (BACTRIM DS) 800-160 MG tablet Take 1 tablet by mouth 2 (two) times daily for 7 days. 07/26/19 08/02/19  Eustace Moore, MD    Family History Family History  Problem Relation Age of Onset  . Diabetes Mother   . Cancer Maternal Aunt        breast  . Diabetes Maternal Grandmother   . Hypertension Maternal Grandmother     Social History Social History   Tobacco Use  . Smoking status: Current Every Day Smoker    Packs/day: 0.25    Years: 8.00    Pack years: 2.00    Types: Cigarettes  . Smokeless tobacco: Never Used  Substance Use Topics  . Alcohol use: Yes    Comment: socially  . Drug use: No     Allergies   Mucinex [guaifenesin er]   Review of Systems Review of Systems  Constitutional: Negative for chills and fever.  HENT: Negative for ear pain and sore throat.   Eyes: Negative for pain and visual disturbance.  Respiratory: Negative for cough and shortness of breath.   Cardiovascular: Negative for chest pain and palpitations.  Gastrointestinal: Negative for abdominal pain and vomiting.  Genitourinary: Negative for dysuria and hematuria.  Musculoskeletal: Negative for arthralgias and back pain.  Skin: Positive for wound. Negative for color change and rash.  Neurological: Negative for seizures and syncope.  All other systems reviewed and are negative.    Physical Exam Triage Vital Signs ED Triage Vitals  Enc Vitals Group     BP 07/26/19 1048 109/72     Pulse Rate 07/26/19 1048 77     Resp 07/26/19 1048 18     Temp 07/26/19 1048 98.6 F (37 C)     Temp Source 07/26/19 1048 Oral     SpO2 07/26/19 1048 99 %     Weight --      Height --      Head Circumference --      Peak Flow --      Pain Score 07/26/19 1049 3     Pain Loc --      Pain Edu? --      Excl. in GC? --    No data found.  Updated Vital Signs BP 109/72 (BP Location: Right Wrist)   Pulse 77   Temp 98.6 F (37 C) (Oral)   Resp 18   LMP 07/02/2019  (Approximate)   SpO2 99%       Physical Exam Constitutional:      General: She is not in acute distress.    Appearance: She is well-developed. She is obese.  HENT:     Head: Normocephalic and atraumatic.  Eyes:     Conjunctiva/sclera: Conjunctivae normal.     Pupils: Pupils are equal, round, and reactive to light.  Neck:     Musculoskeletal: Normal range of motion.  Cardiovascular:     Rate and Rhythm: Normal rate.  Pulmonary:     Effort: Pulmonary effort is normal. No respiratory distress.  Abdominal:     General: There is no distension.     Palpations: Abdomen is soft.  Musculoskeletal: Normal range of motion.  Skin:    General: Skin is warm and dry.     Comments: Under the obese abdominal pannus on the left side there is a 2 to 3 cm indurated area with a fluctuant top, measures only 1 or so centimeters across.  This is I indeed with only a mild amount of purulence released.  The rest of this is indurated.  On the mons pubis there is an indurated mass is 3 to 4 cm across.  It is firm.  It has a punctate opening.  It looks like a sebaceous/epithelial cyst.  No redness, no tenderness, no fluctuation  Neurological:     Mental Status: She is alert.      UC Treatments / Results  Labs (all labs ordered are listed, but only abnormal results are displayed) Labs Reviewed - No data to display  EKG   Radiology No results found.  Procedures Procedures (including critical care time)  Medications Ordered in UC Medications - No data to display  Initial Impression / Assessment and Plan / UC Course  I have reviewed the triage vital signs and the nursing notes.  Pertinent labs & imaging results that were available during my care of the patient were reviewed by me and considered in my medical decision making (see chart for details).     Abscess.  Inclusion cyst. Final Clinical Impressions(s) / UC Diagnoses   Final diagnoses:  Abscess     Discharge Instructions      Take the antibiotic 2 times a day.  Take 2 doses today May take ibuprofen 3 times a day with food Take Diflucan if needed for yeast infection Warm compresses to areas Follow-up if needed    ED Prescriptions    Medication Sig Dispense Auth. Provider   sulfamethoxazole-trimethoprim (BACTRIM DS) 800-160 MG tablet Take 1 tablet by mouth 2 (two) times daily for 7 days. 14 tablet Raylene Everts, MD   ibuprofen (ADVIL) 800 MG tablet Take 1 tablet (800 mg total) by mouth 3 (three) times daily. 21 tablet Raylene Everts, MD   fluconazole (DIFLUCAN) 150 MG tablet Take 1 tablet (150 mg total) by mouth daily. Repeat in 1 week if needed 2 tablet Raylene Everts, MD     PDMP not reviewed this encounter.   Raylene Everts, MD 07/26/19 1201

## 2019-11-16 ENCOUNTER — Other Ambulatory Visit: Payer: Self-pay

## 2019-11-16 ENCOUNTER — Encounter (HOSPITAL_COMMUNITY): Payer: Self-pay

## 2019-11-16 ENCOUNTER — Ambulatory Visit (HOSPITAL_COMMUNITY)
Admission: EM | Admit: 2019-11-16 | Discharge: 2019-11-16 | Disposition: A | Discharge status: Home / Self Care | Payer: 59 | Attending: Family Medicine | Admitting: Family Medicine

## 2019-11-16 DIAGNOSIS — M545 Low back pain, unspecified: Secondary | ICD-10-CM

## 2019-11-16 MED ORDER — CYCLOBENZAPRINE HCL 5 MG PO TABS: 5.0000 mg | ORAL_TABLET | Freq: Three times a day (TID) | ORAL | 0 refills | Status: DC | PRN

## 2019-11-16 MED ORDER — KETOROLAC TROMETHAMINE 60 MG/2ML IM SOLN
60.0000 mg | Freq: Once | INTRAMUSCULAR | Status: AC
  Administered 2019-11-16: 60 mg via INTRAMUSCULAR

## 2019-11-16 MED ORDER — TRAMADOL HCL 50 MG PO TABS: 50.0000 mg | ORAL_TABLET | Freq: Four times a day (QID) | ORAL | 0 refills | Status: DC | PRN

## 2019-11-16 MED ORDER — NAPROXEN 500 MG PO TABS: 500.0000 mg | ORAL_TABLET | Freq: Two times a day (BID) | ORAL | 0 refills | Status: DC

## 2019-11-16 MED ORDER — KETOROLAC TROMETHAMINE 60 MG/2ML IM SOLN
INTRAMUSCULAR | Status: AC
  Filled 2019-11-16: qty 2

## 2019-11-16 NOTE — ED Triage Notes (Signed)
Pt states she has been having back spasms after lying in bed for a week. Pt state she went to the Chiropractor   and her back still hurts.

## 2019-11-16 NOTE — ED Provider Notes (Signed)
[facility]    CSN: [id number] Arrival date & time: 11/16/19  1251      History   Chief Complaint Chief Complaint  Patient presents with  . Back Pain    HPI Betty Shelton is a 34 y.o. female.   Betty Shelton presents with complaints of low back pain, worse to right low back. Tightness/ squeezing/ twisting sensation which is triggered with certain movements. Started on 1/28. No specific injury but prior to onset had been laying in bed a lot due to an episode of depression. Just prior to onset she had then decided to get a new mattress and had cleaned her room/ moved old mattress. No radiation of pain to buttocks or thighs. No numbness tingling or weakness. No saddle symptoms. No bowel or bladder involvement. Has seen her chiropractor twice which hasn't helped. Took ibuprofen last night which didn't help, no medications for pain today. Has had similar in the past when she was pregnant. Lifting or raising her right leg, or weight bearing on right side worsens the pain. Doesn't have a PCP.    ROS per HPI, negative if not otherwise mentioned.      Past Medical History:  Diagnosis Date  . Medical history non-contributory   . Morbid obesity with BMI of 50.0-59.9, adult [facility])     Patient Active Problem List   Diagnosis Date Noted  . Depression 07/23/2017  . Women's annual routine gynecological examination 07/23/2017  . Smoker 02/28/2016  . Morbid obesity ([place]) 01/02/2014    Past Surgical History:  Procedure Laterality Date  . CESAREAN SECTION    . CESAREAN SECTION N/A 08/16/2015   Procedure: CESAREAN SECTION;  Surgeon: Jonnie Kind, MD;  Location: [facility];  Service: Obstetrics;  Laterality: N/A;  . IUD REMOVAL N/A 11/28/2013   Procedure: INTRAUTERINE DEVICE (IUD) REMOVAL;  Surgeon: Emily Filbert, MD;  Location: [facility];  Service: Gynecology;  Laterality: N/A;  . TOOTH EXTRACTION  2014  . WISDOM TOOTH EXTRACTION      OB History    Gravida  3   Para  2   Term   2   Preterm  0   AB  1   Living  2     SAB  1   TAB  0   Ectopic  0   Multiple  0   Live Births  2            Home Medications    Prior to Admission medications   Medication Sig Start Date End Date Taking? Authorizing Provider  cyclobenzaprine (FLEXERIL) 5 MG tablet Take 1 tablet (5 mg total) by mouth 3 (three) times daily as needed for muscle spasms. 11/16/19   Zigmund Gottron, NP  fluconazole (DIFLUCAN) 150 MG tablet Take 1 tablet (150 mg total) by mouth daily. Repeat in 1 week if needed 07/26/19   Raylene Everts, MD  ibuprofen (ADVIL) 800 MG tablet Take 1 tablet (800 mg total) by mouth 3 (three) times daily. 07/26/19   Raylene Everts, MD  naproxen (NAPROSYN) 500 MG tablet Take 1 tablet (500 mg total) by mouth 2 (two) times daily. 11/16/19   Zigmund Gottron, NP  traMADol (ULTRAM) 50 MG tablet Take 1 tablet (50 mg total) by mouth every 6 (six) hours as needed. 11/16/19   Zigmund Gottron, NP    Family History Family History  Problem Relation Age of Onset  . Diabetes Mother   . Cancer Maternal Aunt  breast  . Diabetes Maternal Grandmother   . Hypertension Maternal Grandmother     Social History Social History   Tobacco Use  . Smoking status: Current Every Day Smoker    Packs/day: 0.25    Years: 8.00    Pack years: 2.00    Types: Cigarettes  . Smokeless tobacco: Never Used  Substance Use Topics  . Alcohol use: Yes    Comment: socially  . Drug use: No     Allergies   Mucinex [guaifenesin er]   Review of Systems Review of Systems   Physical Exam Triage Vital Signs ED Triage Vitals [11/16/19 1401]  Enc Vitals Group     BP (!) 148/77     Pulse Rate 74     Resp 18     Temp 98.6 F (37 C)     Temp Source Oral     SpO2 100 %     Weight      Height      Head Circumference      Peak Flow      Pain Score 9     Pain Loc      Pain Edu?      Excl. in GC?    No data found.  Updated Vital Signs BP (!) 148/77 (BP Location: Right  Arm)   Pulse 74   Temp 98.6 F (37 C) (Oral)   Resp 18   LMP 11/11/2019   SpO2 100%    Physical Exam Constitutional:      General: She is not in acute distress.    Appearance: She is well-developed. She is obese.  Cardiovascular:     Rate and Rhythm: Normal rate.  Pulmonary:     Effort: Pulmonary effort is normal.  Musculoskeletal:     Lumbar back: Spasms and tenderness present. No bony tenderness. Decreased range of motion.     Comments: Right low back with tenderness; significant pain noted with certain positions and twistign while stepping onto exam table; unable to tolerate transition from sit to lay due to significant pain; pain with bilateral hip flexion and straight leg raise, to right low back, but no radiation of pain; strength equal bilaterally; gross sensation intact to lower extremities   Skin:    General: Skin is warm and dry.  Neurological:     Mental Status: She is alert and oriented to person, place, and time.      UC Treatments / Results  Labs (all labs ordered are listed, but only abnormal results are displayed) Labs Reviewed - No data to display  EKG   Radiology No results found.  Procedures Procedures (including critical care time)  Medications Ordered in UC Medications  ketorolac (TORADOL) injection 60 mg (60 mg Intramuscular Given 11/16/19 1452)    Initial Impression / Assessment and Plan / UC Course  I have reviewed the triage vital signs and the nursing notes.  Pertinent labs & imaging results that were available during my care of the patient were reviewed by me and considered in my medical decision making (see chart for details).     Lumbar strain. Pain management discussed with nsaids and muscle relaxers. toradol provided in clinic. Encouraged establish with PCP for recheck and management as needed. Return precautions provided. Patient verbalized understanding and agreeable to plan.    Final Clinical Impressions(s) / UC Diagnoses    Final diagnoses:  Acute right-sided low back pain without sciatica     Discharge Instructions     Light and  regular activity as tolerated.  See exercises provided.  Heat application while active can help with muscle spasms.  Sleep with pillow under your knees.   Naproxen twice a day, don't take first dose until tonight.  Flexeril as needed for spasms, every 8 hours as needed, tramadol for breakthrough pain. Both of these may cause drowsiness. Please do not take if driving or drinking alcohol. Be mindful of taking both at once, as well.  Please establish with a primary care provider as you may need further management if persistent or recurrent     ED Prescriptions    Medication Sig Dispense Auth. Provider   naproxen (NAPROSYN) 500 MG tablet Take 1 tablet (500 mg total) by mouth 2 (two) times daily. 30 tablet Linus Mako B, NP   cyclobenzaprine (FLEXERIL) 5 MG tablet Take 1 tablet (5 mg total) by mouth 3 (three) times daily as needed for muscle spasms. 15 tablet Linus Mako B, NP   traMADol (ULTRAM) 50 MG tablet Take 1 tablet (50 mg total) by mouth every 6 (six) hours as needed. 15 tablet Linus Mako B, NP     I have reviewed the PDMP during this encounter.   Georgetta Haber, NP 11/16/19 1546

## 2019-11-16 NOTE — Discharge Instructions (Signed)
Light and regular activity as tolerated.  See exercises provided.  Heat application while active can help with muscle spasms.  Sleep with pillow under your knees.   Naproxen twice a day, don't take first dose until tonight.  Flexeril as needed for spasms, every 8 hours as needed, tramadol for breakthrough pain. Both of these may cause drowsiness. Please do not take if driving or drinking alcohol. Be mindful of taking both at once, as well.  Please establish with a primary care provider as you may need further management if persistent or recurrent

## 2019-12-05 ENCOUNTER — Ambulatory Visit: Payer: 59 | Attending: Internal Medicine | Admitting: Internal Medicine

## 2019-12-05 ENCOUNTER — Other Ambulatory Visit: Payer: Self-pay

## 2019-12-05 DIAGNOSIS — M545 Low back pain, unspecified: Secondary | ICD-10-CM

## 2019-12-05 MED ORDER — NAPROXEN 500 MG PO TABS: 500.0000 mg | ORAL_TABLET | Freq: Two times a day (BID) | ORAL | 0 refills | Status: DC | PRN

## 2019-12-05 MED ORDER — CYCLOBENZAPRINE HCL 5 MG PO TABS: 5.0000 mg | ORAL_TABLET | Freq: Two times a day (BID) | ORAL | 0 refills | Status: DC | PRN

## 2019-12-05 NOTE — Progress Notes (Signed)
Virtual Visit via Telephone Note Due to current restrictions/limitations of in-office visits due to the COVID-19 pandemic, this scheduled clinical appointment was converted to a telehealth visit  I connected with Betty Shelton on 12/05/19 at 8:57 a.m by telephone and verified that I am speaking with the correct person using two identifiers. I am in my office.  The patient is at home.  Only the patient and myself participated in this encounter.  I discussed the limitations, risks, security and privacy concerns of performing an evaluation and management service by telephone and the availability of in person appointments. I also discussed with the patient that there may be a patient responsible charge related to this service. The patient expressed understanding and agreed to proceed.   History of Present Illness: Pt with hx of obesity, tob dep. No previous PCP Today's visit is f/u from UC visit "Pain is still there but not as terrible as it was when I was in the urgent care." -initially pain was severe when she turned over in bed.  Can stand up from laying in bed with slight discomfort.  -pain mainly in RT lower back.  No radiation -started after she spent a wk in bed feeling depress. She decided to get up and do some house work including moving her mattress. Bent over to pick something up and felt a pop in lower back; "it was a slight discomfort."  Went to chiropractor next day then did a workout at home the following day.  Went back to chiropractor for further adjustments.  Pain started the next day. -no numbness or tingling; no weakness in the legs.  "I still have that pain but I can walk just fine." -at UC she was prescribed Tramadol, Flexeril, Naprosyn. -using heating pads and the meds prescribed.    Past social, medical, family history, surgical history reviewed in the system.  Social History   Socioeconomic History  . Marital status: Single    Spouse name: Not on file  . Number of  children: Not on file  . Years of education: Not on file  . Highest education level: Not on file  Occupational History  . Not on file  Tobacco Use  . Smoking status: Current Every Day Smoker    Packs/day: 0.25    Years: 8.00    Pack years: 2.00    Types: Cigarettes  . Smokeless tobacco: Never Used  Substance and Sexual Activity  . Alcohol use: Yes    Comment: socially  . Drug use: No  . Sexual activity: Yes    Birth control/protection: I.U.D.  Other Topics Concern  . Not on file  Social History Narrative  . Not on file   Social Determinants of Health   Financial Resource Strain:   . Difficulty of Paying Living Expenses: Not on file  Food Insecurity:   . Worried About Charity fundraiser in the Last Year: Not on file  . Ran Out of Food in the Last Year: Not on file  Transportation Needs:   . Lack of Transportation (Medical): Not on file  . Lack of Transportation (Non-Medical): Not on file  Physical Activity:   . Days of Exercise per Week: Not on file  . Minutes of Exercise per Session: Not on file  Stress:   . Feeling of Stress : Not on file  Social Connections:   . Frequency of Communication with Friends and Family: Not on file  . Frequency of Social Gatherings with Friends and Family: Not on  file  . Attends Religious Services: Not on file  . Active Member of Clubs or Organizations: Not on file  . Attends Banker Meetings: Not on file  . Marital Status: Not on file    Observations/Objective: No direct observation done as this was a telephone encounter.  Assessment and Plan: 1. Acute right-sided low back pain without sciatica Likely muscle spasm versus nerve irritation.  By her account she is clinically improving but not back to her baseline. I recommend getting a full size heating pad and using it when she is sitting or lying down. Continue Flexeril and Naprosyn as needed. If she does not continue to improve we can refer to physical therapy. -  cyclobenzaprine (FLEXERIL) 5 MG tablet; Take 1 tablet (5 mg total) by mouth 2 (two) times daily as needed for muscle spasms.  Dispense: 30 tablet; Refill: 0 - naproxen (NAPROSYN) 500 MG tablet; Take 1 tablet (500 mg total) by mouth 2 (two) times daily as needed. Take with food  Dispense: 40 tablet; Refill: 0   Follow Up Instructions: As needed   I discussed the assessment and treatment plan with the patient. The patient was provided an opportunity to ask questions and all were answered. The patient agreed with the plan and demonstrated an understanding of the instructions.   The patient was advised to call back or seek an in-person evaluation if the symptoms worsen or if the condition fails to improve as anticipated.  I provided 19 minutes of non-face-to-face time during this encounter.   Jonah Blue, MD

## 2019-12-05 NOTE — Progress Notes (Signed)
Pt states the pain is manageable without the medication but she is wanting to know why is the pain still there   Pt states she is only comfortable when she is laying in bed

## 2020-05-11 ENCOUNTER — Other Ambulatory Visit: Payer: Self-pay

## 2020-05-11 ENCOUNTER — Ambulatory Visit (HOSPITAL_COMMUNITY)
Admission: EM | Admit: 2020-05-11 | Discharge: 2020-05-11 | Disposition: A | Discharge status: Home / Self Care | Payer: 59

## 2020-05-11 NOTE — ED Notes (Signed)
Pt states she tested positive at pharmacy yesterday for COVID; symptoms began last Thursday and requesting testing for her children. Denies symptoms for her children. Explained to pt that we can test her and her children but that they could be negative for now and still develop symptoms/acquire COVID in the next several days and she may want to test them again if they do develop symptoms. Pt elected to NOT have testing today for herself/children and will quarantine and monitor their symptoms and return if needed.

## 2021-02-07 ENCOUNTER — Encounter (HOSPITAL_BASED_OUTPATIENT_CLINIC_OR_DEPARTMENT_OTHER): Payer: Self-pay | Admitting: Emergency Medicine

## 2021-02-07 ENCOUNTER — Other Ambulatory Visit: Payer: Self-pay

## 2021-02-07 ENCOUNTER — Emergency Department (HOSPITAL_BASED_OUTPATIENT_CLINIC_OR_DEPARTMENT_OTHER): Payer: 59

## 2021-02-07 ENCOUNTER — Inpatient Hospital Stay (HOSPITAL_COMMUNITY)
Admission: AD | Admit: 2021-02-07 | Discharge: 2021-02-07 | Disposition: A | Discharge status: Home / Self Care | Payer: 59 | Source: Home / Self Care | Attending: Obstetrics and Gynecology | Admitting: Obstetrics and Gynecology

## 2021-02-07 ENCOUNTER — Emergency Department (HOSPITAL_BASED_OUTPATIENT_CLINIC_OR_DEPARTMENT_OTHER)
Admission: EM | Admit: 2021-02-07 | Discharge: 2021-02-07 | Disposition: A | Discharge status: Home / Self Care | Payer: 59 | Attending: Emergency Medicine | Admitting: Emergency Medicine

## 2021-02-07 DIAGNOSIS — Z3202 Encounter for pregnancy test, result negative: Secondary | ICD-10-CM | POA: Insufficient documentation

## 2021-02-07 DIAGNOSIS — N938 Other specified abnormal uterine and vaginal bleeding: Secondary | ICD-10-CM

## 2021-02-07 DIAGNOSIS — N939 Abnormal uterine and vaginal bleeding, unspecified: Secondary | ICD-10-CM | POA: Insufficient documentation

## 2021-02-07 DIAGNOSIS — N83202 Unspecified ovarian cyst, left side: Secondary | ICD-10-CM | POA: Diagnosis not present

## 2021-02-07 DIAGNOSIS — F17210 Nicotine dependence, cigarettes, uncomplicated: Secondary | ICD-10-CM | POA: Diagnosis not present

## 2021-02-07 LAB — CBC WITH DIFFERENTIAL/PLATELET
Abs Immature Granulocytes: 0.01 10*3/uL (ref 0.00–0.07)
Basophils Absolute: 0 10*3/uL (ref 0.0–0.1)
Basophils Relative: 1 %
Eosinophils Absolute: 0.3 10*3/uL (ref 0.0–0.5)
Eosinophils Relative: 4 %
HCT: 36.1 % (ref 36.0–46.0)
Hemoglobin: 12 g/dL (ref 12.0–15.0)
Immature Granulocytes: 0 %
Lymphocytes Relative: 32 %
Lymphs Abs: 2.8 10*3/uL (ref 0.7–4.0)
MCH: 29.1 pg (ref 26.0–34.0)
MCHC: 33.2 g/dL (ref 30.0–36.0)
MCV: 87.6 fL (ref 80.0–100.0)
Monocytes Absolute: 0.4 10*3/uL (ref 0.1–1.0)
Monocytes Relative: 5 %
Neutro Abs: 5 10*3/uL (ref 1.7–7.7)
Neutrophils Relative %: 58 %
Platelets: 314 10*3/uL (ref 150–400)
RBC: 4.12 MIL/uL (ref 3.87–5.11)
RDW: 12.8 % (ref 11.5–15.5)
WBC: 8.6 10*3/uL (ref 4.0–10.5)
nRBC: 0 % (ref 0.0–0.2)

## 2021-02-07 LAB — POCT PREGNANCY, URINE: Preg Test, Ur: NEGATIVE

## 2021-02-07 LAB — HCG, QUANTITATIVE, PREGNANCY: hCG, Beta Chain, Quant, S: <1 m[IU]/mL (ref <5)

## 2021-02-07 NOTE — MAU Note (Signed)
Betty Shelton is a 36 y.o. here in MAU reporting: thought her period started last Thursday but it was much lighter than normal. States bleeding started getting heavier on Tuesday and her period is lasting longer then normal. Today passed a 2 inch clot. Having some abdominal cramping. Had neg UPT at home.   LMP: 01/31/21  Onset of complaint: ongoing  Pain score: 2/10  Vitals:   02/07/21 0924  BP: (!) 141/81  Pulse: 73  Resp: 16  Temp: 98 F (36.7 C)  SpO2: 99%     Lab orders placed from triage: UPT

## 2021-02-07 NOTE — ED Provider Notes (Signed)
MEDCENTER [facility] EMERGENCY DEPT Provider Note   CSN: [id number] Arrival date & time: 02/07/21  1216     History Chief Complaint  Patient presents with  . Vaginal Bleeding    Betty Shelton is a 35 y.o. female.  The history is provided by the patient.  Vaginal Bleeding Quality:  Dark red and clots Severity:  Moderate Onset quality:  Sudden Duration:  7 days Timing:  Constant Progression:  Worsening Chronicity:  New Menstrual history:  Regular Possible pregnancy: no (Had negative pregnancy test today)   Relieved by:  Nothing Worsened by:  Nothing Ineffective treatments:  None tried Associated symptoms: abdominal pain (like period cramps, no worse)   Associated symptoms: no back pain, no dysuria and no fever        Past Medical History:  Diagnosis Date  . Medical history non-contributory   . Morbid obesity with BMI of 50.0-59.9, adult [facility])     Patient Active Problem List   Diagnosis Date Noted  . Depression 07/23/2017  . Women's annual routine gynecological examination 07/23/2017  . Smoker 02/28/2016  . Morbid obesity (HCC) 01/02/2014    Past Surgical History:  Procedure Laterality Date  . CESAREAN SECTION    . CESAREAN SECTION N/A 08/16/2015   Procedure: CESAREAN SECTION;  Surgeon: Tilda Burrow, MD;  Location: [facility] ORS;  Service: Obstetrics;  Laterality: N/A;  . IUD REMOVAL N/A 11/28/2013   Procedure: INTRAUTERINE DEVICE (IUD) REMOVAL;  Surgeon: Allie Bossier, MD;  Location: [facility] ORS;  Service: Gynecology;  Laterality: N/A;  . TOOTH EXTRACTION  2014  . WISDOM TOOTH EXTRACTION       OB History    Gravida  3   Para  2   Term  2   Preterm  0   AB  1   Living  2     SAB  1   IAB  0   Ectopic  0   Multiple  0   Live Births  2           Family History  Problem Relation Age of Onset  . Diabetes Mother   . Cancer Maternal Aunt        breast  . Diabetes Maternal Grandmother   . Hypertension Maternal Grandmother     Social  History   Tobacco Use  . Smoking status: Current Every Day Smoker    Packs/day: 0.25    Years: 8.00    Pack years: 2.00    Types: Cigarettes  . Smokeless tobacco: Never Used  Vaping Use  . Vaping Use: Never used  Substance Use Topics  . Alcohol use: Yes    Comment: socially  . Drug use: No    Home Medications Prior to Admission medications   Medication Sig Start Date End Date Taking? Authorizing Provider  cyclobenzaprine (FLEXERIL) 5 MG tablet Take 1 tablet (5 mg total) by mouth 2 (two) times daily as needed for muscle spasms. 12/05/19   Marcine Matar, MD  naproxen (NAPROSYN) 500 MG tablet Take 1 tablet (500 mg total) by mouth 2 (two) times daily as needed. Take with food 12/05/19   Marcine Matar, MD  traMADol (ULTRAM) 50 MG tablet Take 1 tablet (50 mg total) by mouth every 6 (six) hours as needed. Patient not taking: Reported on 12/05/2019 11/16/19   Georgetta Haber, NP    Allergies    Mucinex [guaifenesin er]  Review of Systems   Review of Systems  Constitutional: Negative for chills  and fever.  HENT: Negative for ear pain and sore throat.   Eyes: Negative for pain and visual disturbance.  Respiratory: Negative for cough and shortness of breath.   Cardiovascular: Negative for chest pain and palpitations.  Gastrointestinal: Positive for abdominal pain (like period cramps, no worse). Negative for vomiting.  Genitourinary: Positive for vaginal bleeding. Negative for dysuria and hematuria.  Musculoskeletal: Negative for arthralgias and back pain.  Skin: Negative for color change and rash.  Neurological: Negative for seizures and syncope.  All other systems reviewed and are negative.   Physical Exam Updated Vital Signs BP (!) 149/127 (BP Location: Left Arm)   Pulse 80   Temp 98.7 F (37.1 C) (Oral)   Resp 18   Ht 5\' 5"  (1.651 m)   Wt (!) 156.4 kg   LMP 01/31/2021   SpO2 99%   BMI 57.39 kg/m   Physical Exam Vitals and nursing note reviewed. Exam conducted  with a chaperone present.  HENT:     Head: Normocephalic and atraumatic.  Eyes:     General: No scleral icterus. Pulmonary:     Effort: Pulmonary effort is normal. No respiratory distress.  Genitourinary:    General: Normal vulva.     Vagina: Bleeding present.     Cervix: No cervical motion tenderness.     Uterus: Normal.      Adnexa: Right adnexa normal and left adnexa normal.  Musculoskeletal:     Cervical back: Normal range of motion.  Skin:    General: Skin is warm and dry.  Neurological:     Mental Status: She is alert.  Psychiatric:        Mood and Affect: Mood normal.     ED Results / Procedures / Treatments   Labs (all labs ordered are listed, but only abnormal results are displayed) Labs Reviewed  HCG, QUANTITATIVE, PREGNANCY  CBC WITH DIFFERENTIAL/PLATELET    EKG None  Radiology 02/02/2021 PELVIC COMPLETE W TRANSVAGINAL AND TORSION R/O  Result Date: 02/07/2021 CLINICAL DATA:  Dysfunctional uterine bleeding EXAM: TRANSABDOMINAL AND TRANSVAGINAL ULTRASOUND OF PELVIS DOPPLER ULTRASOUND OF OVARIES TECHNIQUE: Study was performed transabdominally to optimize pelvic field of view evaluation and transvaginally to optimize internal visceral architecture evaluation. Color and duplex Doppler ultrasound was utilized to evaluate blood flow to the ovaries. COMPARISON:  None. FINDINGS: Uterus Measurements: 9.2 x 4.6 x 4.1 cm = volume: 91.2 mL. No fibroid or other intrauterine mass evident. Uterine echotexture normal. Endometrium Thickness: 12 mm.  No focal abnormality visualized. Right ovary Measurements: 4.0 x 1.8 x 1.9 cm = volume: 7.3 mL. Normal appearance/no adnexal mass. Left ovary Measurements: 4.3 x 2.2 x 1.5 cm = volume: 7.1 mL. There is a complex predominantly cystic left adnexal mass measuring 6.8 x 6.1 x 6.5 cm. No other extrauterine pelvic mass. Pulsed Doppler evaluation of both ovaries demonstrates normal low-resistance arterial and venous waveforms. Other findings No abnormal  free fluid. IMPRESSION: 1. There is a complex predominantly cystic left adnexal mass measuring 6.8 x 6.1 x 6.5 cm. Statistically, this mass most likely represents a hemorrhagic cyst. Differential considerations must include endometrioma and atypical ovarian neoplasm. Short-interval follow up ultrasound in 6-12 weeks is recommended, preferably during the week following the patient's normal menses. 2. Low resistance waveform in each ovary. No findings indicative of ovarian torsion. Note that the current left adnexal mass potentially places left ovary at increased risk for torsion. 3.  No intrauterine mass.  Endometrium does not appear thickened. Electronically Signed   By: 8-12  Margarita Grizzle III M.D.   On: 02/07/2021 14:47    Procedures Procedures   Medications Ordered in ED Medications - No data to display  ED Course  I have reviewed the triage vital signs and the nursing notes.  Pertinent labs & imaging results that were available during my care of the patient were reviewed by me and considered in my medical decision making (see chart for details).    MDM Rules/Calculators/A&P                          Betty Shelton presented with heavy vaginal bleeding.  She was evaluated for evidence of anemia.  Hemoglobin was normal.  I was able to review pregnancy test performed today at the maternal admission unit.  Ultrasound revealed what is likely a hemorrhagic cyst, and the patient was advised on instructions to follow-up within 6 to 12 weeks for repeat. Final Clinical Impression(s) / ED Diagnoses Final diagnoses:  Cyst of left ovary  Vaginal bleeding    Rx / DC Orders ED Discharge Orders    None       Koleen Distance, MD 02/07/21 1547

## 2021-02-07 NOTE — MAU Provider Note (Signed)
Betty Shelton is a 35 y.o. [phone] patient who presents to MAU today with complaint of vaginal bleeding. Reports her period has gone on for longer than usual for a couple days and had some bigger clots than usual around 2 inches. She tracks her period and does not think she'Betty pregnant. Negative UPT at home. Worried that she might be miscarrying. Does not want to be pregnant. Has had regular periods, just wants to know why her bleeding pattern has changed.   O BP (!) 141/81 (BP Location: Right Arm)   Pulse 73   Temp 98 F (36.7 C) (Oral)   Resp 16   Wt (!) 156.4 kg   LMP 01/31/2021   SpO2 99% Comment: room air  BMI 57.39 kg/m  Physical Exam Vitals reviewed.  Constitutional:      General: She is not in acute distress.    Appearance: She is well-developed. She is not diaphoretic.  Eyes:     General: No scleral icterus. Pulmonary:     Effort: Pulmonary effort is normal. No respiratory distress.  Skin:    General: Skin is warm and dry.  Neurological:     Mental Status: She is alert.     Coordination: Coordination normal.    Results for orders placed or performed during the hospital encounter of 02/07/21 (from the past 24 hour(Betty))  Pregnancy, urine POC     Status: None   Collection Time: 02/07/21  8:54 AM  Result Value Ref Range   Preg Test, Ur NEGATIVE NEGATIVE     A Medical screening exam complete Abnormal uterine bleeding. Discussed possibilities (obesity/excess estrogen, fibroids, etc)  P Discharge from MAU in stable condition Patient given the option of transfer to [facility] for further evaluation or seek care in outpatient facility of choice. Would like to establish at [facility] with Dr. Hyacinth Meeker for follow up List of options for follow-up given  Warning signs for worsening condition that would warrant emergency follow-up discussed Patient may return to MAU as needed   Venora Maples, MD 02/07/2021 9:53 AM

## 2021-02-07 NOTE — Discharge Instructions (Signed)
Now open at [facility]. Turn to [facility] for [facility] for experienced and compassionate care for all women - no matter what phase of life you're in.  Make an Appointment Call [phone] to make an appointment.

## 2021-02-07 NOTE — ED Triage Notes (Signed)
Pt via pov from home with heavy vaginal bleeding. Pt states she started spotting on Friday and that it became heavy on Monday. She expected her menstrual period, but this is heavier than usual, and she states she passed a very large clot this morning. Pt has had several negative pregnancy tests, including at the maternity ED this morning. Pt alert & oriented, nad noted.

## 2021-02-18 ENCOUNTER — Other Ambulatory Visit (HOSPITAL_COMMUNITY): Payer: Self-pay | Admitting: Emergency Medicine

## 2021-02-18 ENCOUNTER — Other Ambulatory Visit (HOSPITAL_BASED_OUTPATIENT_CLINIC_OR_DEPARTMENT_OTHER): Payer: Self-pay | Admitting: Emergency Medicine

## 2021-02-18 DIAGNOSIS — N83202 Unspecified ovarian cyst, left side: Secondary | ICD-10-CM

## 2021-03-07 ENCOUNTER — Other Ambulatory Visit: Payer: Self-pay

## 2021-03-07 ENCOUNTER — Ambulatory Visit (HOSPITAL_BASED_OUTPATIENT_CLINIC_OR_DEPARTMENT_OTHER)
Admission: RE | Admit: 2021-03-07 | Discharge: 2021-03-07 | Disposition: A | Discharge status: Home / Self Care | Payer: 59 | Source: Ambulatory Visit | Attending: Emergency Medicine | Admitting: Emergency Medicine

## 2021-03-07 DIAGNOSIS — N83202 Unspecified ovarian cyst, left side: Secondary | ICD-10-CM | POA: Insufficient documentation

## 2021-03-28 ENCOUNTER — Other Ambulatory Visit (HOSPITAL_BASED_OUTPATIENT_CLINIC_OR_DEPARTMENT_OTHER): Payer: Self-pay

## 2021-03-28 ENCOUNTER — Ambulatory Visit (INDEPENDENT_AMBULATORY_CARE_PROVIDER_SITE_OTHER): Payer: 59 | Admitting: Nurse Practitioner

## 2021-03-28 ENCOUNTER — Encounter (HOSPITAL_BASED_OUTPATIENT_CLINIC_OR_DEPARTMENT_OTHER): Payer: Self-pay | Admitting: Nurse Practitioner

## 2021-03-28 ENCOUNTER — Other Ambulatory Visit: Payer: Self-pay

## 2021-03-28 VITALS — BP 130/80 | HR 71 | Ht 65.0 in | Wt 342.4 lb

## 2021-03-28 DIAGNOSIS — Z7689 Persons encountering health services in other specified circumstances: Secondary | ICD-10-CM | POA: Diagnosis not present

## 2021-03-28 DIAGNOSIS — L732 Hidradenitis suppurativa: Secondary | ICD-10-CM

## 2021-03-28 DIAGNOSIS — F419 Anxiety disorder, unspecified: Secondary | ICD-10-CM

## 2021-03-28 DIAGNOSIS — F32A Depression, unspecified: Secondary | ICD-10-CM

## 2021-03-28 DIAGNOSIS — N83209 Unspecified ovarian cyst, unspecified side: Secondary | ICD-10-CM | POA: Insufficient documentation

## 2021-03-28 MED ORDER — DOXYCYCLINE HYCLATE 100 MG PO TABS: 100.0000 mg | ORAL_TABLET | Freq: Two times a day (BID) | ORAL | 0 refills | Status: DC

## 2021-03-28 MED ORDER — SPIRONOLACTONE 25 MG PO TABS: 25.0000 mg | ORAL_TABLET | Freq: Every day | ORAL | 11 refills | Status: DC

## 2021-03-28 MED ORDER — FLUCONAZOLE 150 MG PO TABS: 150.0000 mg | ORAL_TABLET | Freq: Every day | ORAL | 1 refills | Status: DC

## 2021-03-28 NOTE — Assessment & Plan Note (Addendum)
New patient to practice Will review medical history and request records for further evaluation.  Will need CPE and labs in the future Acute issues addressed today Plan for 6 monthf/u for CPE and labs

## 2021-03-28 NOTE — Assessment & Plan Note (Signed)
Symptoms and presentation consistent with hidradenitis suppurativa. Recommend OCP for hormonal management of symptoms- she declines this today Recommend doxycycline 100mg  BID for 10 days for current outbreak- diflucan provided for yeast- patient reports with antibiotic use. Recommend daily spironolactone for androgen suppression. If symptoms worsen, return, or fail to improve, please follow-up

## 2021-03-28 NOTE — Assessment & Plan Note (Signed)
Positive PHQ-9 and GAD-7 today Patient would like to avoid medication at this time, but is interested in counseling to help with her emotions.  Referral placed Discussed with patient if at any time she feels medication would be helpful to let me know and we can discuss options.  She is agreeable to plan.

## 2021-03-28 NOTE — Assessment & Plan Note (Signed)
Chart review reveals hemorrhagic cyst detected on [place] with evidence of decreasing size one month ago. No tenderness palpated today. One day late for menses- denies any recent sexual activity. Declines pregnancy testing today. Recommend OCP for management of cyst to help with faster resolution, pt declines this today. Discussed that ovulation may be missed with large cysts on the ovaries and menses may be missed or delayed due to this. Strongly recommend safe sexual practices to avoid conception if this is not planned at this time given the risk of abnormal ovulation cycle. Recommend f/u US in 5-6 months for evaluation for resolution unless problems arise sooner.

## 2021-03-28 NOTE — Patient Instructions (Addendum)
Recommendations from today's visit: I have sent in Doxycycline 136m twice a day for 10 days for the hidradenitis surpurativa active infection I have also sent in spironolactone for you to take for   Information on diet, exercise, and health maintenance recommendations are listed below. This is information to help you be sure you are on track for optimal health and monitoring.   Please look over this and let [place] if you have any questions or if you have completed any of the health maintenance [place] that [place] can be sure your records are up to date.   Our records indicate you are due for the following: COVID Vaccine  We will update this based on information you provide and previous records.  If you have not had this done, please let [place] and we can help you complete this recommendation.   ___________________________________________________________  [facility] you [facility]. I am excited for the opportunity to partner with you to meet your health care goals. It was a pleasure meeting you today!  I am an Adult-Geriatric Nurse Practitioner with a background in caring for patients for more than 20 years. I received my BPaediatric nursein Nursing and my Doctor of Nursing Practice degrees at [employer] I received additional fellowship training in primary care and sports medicine after receiving my doctorate degree. I provide primary care and sports medicine services to patients age 7735and older within this office. I am also a provider with the [facility] the director of the APP Fellowship [facility]  [place] years and am proud to be a member of this community.   I am passionate about providing the best service to you through preventive medicine and supportive care. I consider you  a part of the medical team and value your input. I work diligently to ensure that you are heard and your needs are met in a safe and effective manner. I want you to feel comfortable with me as your provider and want you to know that your health concerns are important to me.   For your information, our office hours are Monday- Friday 8:00 AM - 5:00 PM At this time I am not in the office on Wednesdays.  If you have questions or concerns, please call our office at [phone] send [place] MyChart message and we will respond as quickly as possible.   For all urgent or time sensitive needs we ask that you please call the office to avoid delays. MyChart is not constantly monitored and replies may take up to 72 business hours.  MyChart Policy: MyChart allows for you to see your visit notes, after visit summary, provider recommendations, lab and tests results, make an appointment, request refills, and contact your provider or the office for non-urgent questions or concerns.  Providers are seeing patients during normal business hours and do not have built in time to review MyChart messages. We ask that you allow a minimum of 72 business hours for MyChart message responses.  Complex MyChart concerns may require a visit. Your provider may request you schedule a virtual or in person visit to ensure we are providing the best care possible. MyChart messages sent after 4:00 PM on Friday will not be received by the provider until Monday morning.  Lab and Test Results: You will receive your lab and test results on MyChart as soon as they are completed and results have been sent by the lab or testing facility. Due to this service, you will receive your results BEFORE your provider.  Please allow a minimum of 72 business hours for your provider to receive and review lab and test results and contact you about.   Most lab and test result comments from the provider will be sent through [facility]. Your provider may  recommend changes to the plan of care, follow-up visits, repeat testing, ask questions, or request an office visit to discuss these results. You may reply directly to this message or call the office at [phone] to provide information for the provider or set up an appointment. In some instances, you will be called with test results and recommendations. Please let us know if this is preferred and we will make note of this in your chart to provide this for you.    If you have not heard a response to your lab or test results in 72 business hours, please call the office to let us know.   After Hours: For all non-emergency after hours needs, please call the office at [phone] and select the option to reach the on-call provider service. On-call services are shared between multiple [facility] offices and therefore it will not be possible to speak directly with your provider. On-call providers may provide medical advice and recommendations, but are unable to provide refills for maintenance medications.  For all emergency or urgent medical needs after normal business hours, we recommend that you seek care at the closest Urgent Care or Emergency Department to ensure appropriate treatment in a timely manner.  [facility] has a 24 hour emergency room located on the ground floor for your convenience.    Please do not hesitate to reach out to [place] with concerns.   Thank you, again, for choosing me as your health care partner. I appreciate your trust and look forward to learning more about you.   Worthy Keeler, DNP, AGNP-c ___________________________________________________________  Health Maintenance Recommendations Screening Testing Mammogram Every 1 -2 years based on history and risk factors Starting at age 60 Pap Smear Ages 21-39 every 3 years Ages 48-65 every 5 years with HPV testing More frequent testing may be required based on results and history Colon Cancer  Screening Every 1-10 years based on test performed, risk factors, and history Starting at age 79 Bone Density Screening Every 2-10 years based on history Starting at age 60 for women Recommendations for men differ based on medication usage, history, and risk factors AAA Screening One time ultrasound Men 68-42 years old who have every smoked Lung Cancer Screening Low Dose Lung CT every 12 months Age 83-80 years with a 30 pack-year smoking history who still smoke or who have quit within the last 15 years  Screening Labs Routine  Labs: Complete Blood Count (CBC), Complete Metabolic Panel (CMP), Cholesterol (Lipid Panel) Every 6-12 months based on history and medications May be recommended more frequently based on current conditions or previous results Hemoglobin A1c Lab Every 3-12 months based on history and previous results Starting at age 81 or earlier with diagnosis of diabetes, high cholesterol, BMI >26, and/or risk factors Frequent monitoring for patients with diabetes to ensure blood sugar control Thyroid Panel (TSH w/ T3 & T4) Every 6 months based on history, symptoms, and risk factors May be repeated more often if on medication HIV  One time testing for all patients 50 and older May be repeated more frequently for patients with increased risk factors or exposure Hepatitis C One time testing for all patients 77 and older May be repeated more frequently for patients with increased risk factors or exposure Gonorrhea, Chlamydia Every 12 months for all sexually active persons 13-24 years Additional monitoring may be recommended for those who are considered high risk or who have symptoms PSA Men 4-82 years old with risk factors Additional screening may be recommended from age 44-69 based on risk factors, symptoms, and history  Vaccine Recommendations Tetanus Booster All adults every 10 years Flu Vaccine All patients 6 months and older every year COVID Vaccine All patients  12 years and older Initial dosing with booster May recommend additional booster based on age and health history HPV Vaccine 2 doses all patients age 60-26 Dosing may be considered for patients over 26 Shingles Vaccine (Shingrix) 2 doses all adults 58 years and older Pneumonia (Pneumovax 23) All adults 71 years and older May recommend earlier dosing based on health history Pneumonia (Prevnar 85) All adults 53 years and older Dosed 1 year after Pneumovax 23  Additional Screening, Testing, and Vaccinations may be recommended on an individualized basis based on family history, health history, risk factors, and/or exposure.  __________________________________________________________  Diet Recommendations for All Patients  I recommend that all patients maintain a diet low in saturated fats, carbohydrates, and cholesterol. While this can be challenging at first, it is not impossible and small changes can make big differences.  Things to try: Decreasing the amount of soda, sweet tea, and/or juice to one or less per day and replace with water While water is always the first choice, if you do not like water you may consider adding a water additive without sugar to improve the taste other sugar free drinks Replace potatoes with a brightly colored vegetable at dinner Use healthy oils, such as canola oil or olive oil, instead of butter or hard margarine Limit your bread intake to two pieces or less a day Replace regular pasta with low carb pasta options Bake, broil, or grill foods instead of frying Monitor portion sizes  Eat smaller, more frequent meals throughout the day instead of large meals  An important thing to remember is, if you love foods that are not great for your health, you don't have to give them up completely. Instead, allow these foods to be a reward when you have done well. Allowing yourself to still have special treats every once in a while is a nice way to tell yourself thank  you for working hard to keep yourself healthy.   Also remember that every day is a new day. If you have a bad day and "fall off the wagon", you can still climb right back up and keep moving along on your journey!  We have resources available to help you!  Some websites that may be helpful include: www.http://carter.biz/  Www.VeryWellFit.com _____________________________________________________________  Activity Recommendations for All Patients  I recommend that all adults get at least 20 minutes of moderate physical activity that elevates your heart rate at least 5 days out of the week.  Some examples include: Walking or jogging at a pace that allows you to carry on a conversation Cycling (stationary bike or outdoors) Water aerobics Yoga Weight lifting Dancing If physical limitations prevent you from putting stress on your joints, exercise in a pool or seated in a chair are excellent options.  Do determine your MAXIMUM  heart rate for activity: YOUR AGE - 220 = MAX HeartRate   Remember! Do not push yourself too hard.  Start slowly and build up your pace, speed, weight, time in exercise, etc.  Allow your body to rest between exercise and get good sleep. You will need more water than normal when you are exerting yourself. Do not wait until you are thirsty to drink. Drink with a purpose of getting in at least 8, 8 ounce glasses of water a day plus more depending on how much you exercise and sweat.    If you begin to develop dizziness, chest pain, abdominal pain, jaw pain, shortness of breath, headache, vision changes, lightheadedness, or other concerning symptoms, stop the activity and allow your body to rest. If your symptoms are severe, seek emergency evaluation immediately. If your symptoms are concerning, but not severe, please let us know so that we can recommend further evaluation.   ________________________________________________________________

## 2021-03-28 NOTE — Progress Notes (Signed)
Shawna Clamp, DNP, AGNP-c Primary Care Services ______________________________________________________________________________________________________________________________________________  HPI Betty Shelton is a 35 y.o. female presenting to [facility] at [facility] today to establish care.   Patient Care Team: Taesean Reth, Sung Amabile, NP as PCP - General (Nurse Practitioner)  Concerns today: Hemorrhagic Cyst Endorses heavy menstrual bleeding with large clots with ED visit on 02/07/21 US revealed hemorrhagic cyst appx 7cm in diameter present Repeat US on 03/07/21 showed cyst reduced to appx 5cm Endorses normal periods up until cyst  Period in May normal Was supposed to start menstruating yesterday, but has not. Concerned that cyst could be affecting menstruation No sexual intercourse Not on birth control- uses condoms Denies abdominal pain, bloating, changes in BM Boils Endorses "boils" on and under her breasts, axilla, and in the groin that come and go since she was a teenager Reports these boils are painful and will drain with time Endorses they are getting larger Stopped wearing a bra due to the pressure on the breasts Has had them lanced previously No fevers, chills, nausea, vomiting, diarrhea Depression/Anxiety Sx Endorses short temper, easily agitated, sleeping a lot, lack of motivation- specifically with housekeeping Feels she is depressed Never been on medication or had counseling Denies SI/HI/Self-harm  PHQ9 Today: Depression screen [facility] 2/9 03/28/2021 12/05/2019 10/07/2018  Decreased Interest 2 0 0  Down, Depressed, Hopeless 1 1 1   PHQ - 2 Score 3 1 1   Altered sleeping 1 - 2  Tired, decreased energy 1 - 1  Change in appetite 2 - 0  Feeling bad or failure about yourself  0 - 0  Trouble concentrating 1 - 0  Moving slowly or fidgety/restless 0 - 0  Suicidal thoughts 0 - 0  PHQ-9 Score 8 - 4  Difficult doing work/chores  Somewhat difficult - -   GAD7 Today: GAD 7 : Generalized Anxiety Score 03/28/2021 12/05/2019 10/07/2018  Nervous, Anxious, on Edge 1 0 0  Control/stop worrying 1 0 0  Worry too much - different things 0 0 0  Trouble relaxing 0 1 0  Restless 0 0 0  Easily annoyed or irritable 2 0 1  Afraid - awful might happen 0 0 0  Total GAD 7 Score 4 1 1   Anxiety Difficulty Not difficult at all - -    Health Maintenance Due  Topic Date Due   Pneumococcal Vaccine 1-1 Years old (1 - PCV) Never done   COVID-19 Vaccine (3 - Booster for series) 11/28/2020     PMH Past Medical History:  Diagnosis Date   Allergy    Medical history non-contributory    Morbid obesity with BMI of 50.0-59.9, adult (HCC)     ROS All review of systems negative except what is listed in the HPI  PHYSICAL EXAM Physical Exam Vitals and nursing note reviewed.  Constitutional:      General: She is not in acute distress.    Appearance: Normal appearance. She is obese. She is not ill-appearing.  HENT:     Head: Normocephalic and atraumatic.  Eyes:     Extraocular Movements: Extraocular movements intact.     Conjunctiva/sclera: Conjunctivae normal.     Pupils: Pupils are equal, round, and reactive to light.  Cardiovascular:     Rate and Rhythm: Normal rate and regular rhythm.     Pulses: Normal pulses.     Heart sounds: Normal heart sounds.  Pulmonary:     Effort: Pulmonary effort is normal.     Breath sounds:  Normal breath sounds.  Abdominal:     General: Bowel sounds are normal. There is no distension.     Palpations: Abdomen is soft. There is no mass.     Tenderness: There is no abdominal tenderness. There is no right CVA tenderness, left CVA tenderness, guarding or rebound.     Hernia: No hernia is present.  Musculoskeletal:        General: Normal range of motion.     Cervical back: Normal range of motion.     Right lower leg: No edema.  Skin:    General: Skin is warm and dry.     Capillary  Refill: Capillary refill takes less than 2 seconds.     Findings: Lesion present.          Comments: Multiple cystic lesions with tracking in various states of healing present on the breasts, more on the right than left. Additional lesions on the right thigh proximal to the groin. Evidence of healed lesions and scarring present on the axilla and breasts, as well.    Neurological:     General: No focal deficit present.     Mental Status: She is alert and oriented to person, place, and time.  Psychiatric:        Mood and Affect: Mood normal.        Behavior: Behavior normal.        Thought Content: Thought content normal.        Judgment: Judgment normal.      ASSESSMENT AND PLAN Problem List Items Addressed This Visit     Hemorrhagic ovarian cyst   Encounter to establish care - Primary   Other Visit Diagnoses     Hidradenitis suppurativa       Relevant Medications   doxycycline (VIBRA-TABS) 100 MG tablet   spironolactone (ALDACTONE) 25 MG tablet   fluconazole (DIFLUCAN) 150 MG tablet   Anxiety and depression       Relevant Orders   Ambulatory referral to Psychology       Education provided today during visit and on AVS for patient to review at home.  Diet and Exercise recommendations provided.  Current diagnoses and recommendations discussed. HM recommendations reviewed with recommendations.    Outpatient Encounter Medications as of 03/28/2021  Medication Sig   doxycycline (VIBRA-TABS) 100 MG tablet Take 1 tablet (100 mg total) by mouth 2 (two) times daily.   fluconazole (DIFLUCAN) 150 MG tablet Take 1 tablet (150 mg total) by mouth daily.   spironolactone (ALDACTONE) 25 MG tablet Take 1 tablet (25 mg total) by mouth daily.   [DISCONTINUED] fluconazole (DIFLUCAN) 150 MG tablet Take 1 tab at onset of vaginal yeast infection; take the second tab 4 days later if symptoms persist   [DISCONTINUED] cyclobenzaprine (FLEXERIL) 5 MG tablet Take 1 tablet (5 mg total) by mouth 2  (two) times daily as needed for muscle spasms.   [DISCONTINUED] naproxen (NAPROSYN) 500 MG tablet Take 1 tablet (500 mg total) by mouth 2 (two) times daily as needed. Take with food   [DISCONTINUED] traMADol (ULTRAM) 50 MG tablet Take 1 tablet (50 mg total) by mouth every 6 (six) hours as needed. (Patient not taking: Reported on 12/05/2019)   No facility-administered encounter medications on file as of 03/28/2021.    Return in about 6 months (around 09/27/2021) for CPE.  Time: 65 minutes, >50% spent counseling, care coordination, chart review, and documentation.   Tollie Eth, DNP, AGNP-c

## 2021-04-26 ENCOUNTER — Ambulatory Visit (INDEPENDENT_AMBULATORY_CARE_PROVIDER_SITE_OTHER): Payer: 59 | Admitting: Psychologist

## 2021-04-26 DIAGNOSIS — F320 Major depressive disorder, single episode, mild: Secondary | ICD-10-CM

## 2021-05-10 ENCOUNTER — Ambulatory Visit (INDEPENDENT_AMBULATORY_CARE_PROVIDER_SITE_OTHER): Payer: 59 | Admitting: Psychologist

## 2021-05-10 DIAGNOSIS — F320 Major depressive disorder, single episode, mild: Secondary | ICD-10-CM | POA: Diagnosis not present

## 2021-06-10 ENCOUNTER — Ambulatory Visit: Payer: 59 | Admitting: Psychologist

## 2021-06-21 ENCOUNTER — Other Ambulatory Visit: Payer: Self-pay

## 2021-06-21 ENCOUNTER — Encounter (HOSPITAL_BASED_OUTPATIENT_CLINIC_OR_DEPARTMENT_OTHER): Payer: Self-pay | Admitting: Family Medicine

## 2021-06-21 ENCOUNTER — Ambulatory Visit (INDEPENDENT_AMBULATORY_CARE_PROVIDER_SITE_OTHER): Payer: 59 | Admitting: Family Medicine

## 2021-06-21 DIAGNOSIS — H60501 Unspecified acute noninfective otitis externa, right ear: Secondary | ICD-10-CM | POA: Diagnosis not present

## 2021-06-21 DIAGNOSIS — H6091 Unspecified otitis externa, right ear: Secondary | ICD-10-CM | POA: Insufficient documentation

## 2021-06-21 MED ORDER — CLOTRIMAZOLE 1 % EX SOLN: CUTANEOUS | 0 refills | Status: DC

## 2021-06-21 NOTE — Progress Notes (Signed)
    Procedures performed today:    None.  Independent interpretation of notes and tests performed by another provider:   None.  Brief History, Exam, Impression, and Recommendations:    BP 128/83   Pulse 71   Ht 5\' 5"  (1.651 m)   Wt (!) 345 lb 3.2 oz (156.6 kg)   SpO2 99%   BMI 57.44 kg/m   Otitis externa of right ear Symptoms initially started about 1 week ago. She has had associated pain, itching of right ear.  No gross discharge initially noted.  She was seen at urgent care and diagnosed with otitis externa and started on topical antibiotic drops.  She has been using these since then, but notes that symptoms have persisted.  Pain has possibly improved slightly, but continues to have notable itching.  She was also seen at another urgent care a few days ago and ear was irrigated at that point.  She was instructed to continue on the same antibiotic drops.  She denies any systemic symptoms such as fevers, chills, night sweats.  No hearing changes.  No significant tenderness around the ear. On exam, there is a whitish substance along walls of auditory canal, mild amount of white substance covering tympanic membrane.  No significant pain with manipulation of ear.  No tenderness to palpation along the skull surrounding the ear.  No erythema about the ear.  In left ear canal, cerumen noted causing symptoms. Suspect possible underlying fungal infection as etiology of current symptoms Will start patient on topical antifungal drops and discontinue antibiotic drops at this time Will also irrigate bilateral ears in office today. Plan for follow-up in about 10 to 14 days to monitor response to therapy.  If continuing to have debris present in right ear, will irrigate and clean the ear again and continue with antifungal drops for further 10 to 14 days with repeat assessment.  If continuing to have debris in the ear at next office visit, will also consider referral to ENT for further evaluation and  treatment.   ___________________________________________ Elese Rane de , MD, ABFM, CAQSM Primary Care and Sports Medicine [facility]

## 2021-06-21 NOTE — Assessment & Plan Note (Signed)
Symptoms initially started about 1 week ago. She has had associated pain, itching of right ear.  No gross discharge initially noted.  She was seen at urgent care and diagnosed with otitis externa and started on topical antibiotic drops.  She has been using these since then, but notes that symptoms have persisted.  Pain has possibly improved slightly, but continues to have notable itching.  She was also seen at another urgent care a few days ago and ear was irrigated at that point.  She was instructed to continue on the same antibiotic drops.  She denies any systemic symptoms such as fevers, chills, night sweats.  No hearing changes.  No significant tenderness around the ear. On exam, there is a whitish substance along walls of auditory canal, mild amount of white substance covering tympanic membrane.  No significant pain with manipulation of ear.  No tenderness to palpation along the skull surrounding the ear.  No erythema about the ear.  In left ear canal, cerumen noted causing symptoms. Suspect possible underlying fungal infection as etiology of current symptoms Will start patient on topical antifungal drops and discontinue antibiotic drops at this time Will also irrigate bilateral ears in office today. Plan for follow-up in about 10 to 14 days to monitor response to therapy.  If continuing to have debris present in right ear, will irrigate and clean the ear again and continue with antifungal drops for further 10 to 14 days with repeat assessment.  If continuing to have debris in the ear at next office visit, will also consider referral to ENT for further evaluation and treatment.

## 2021-06-21 NOTE — Addendum Note (Signed)
Addended by: Rebbeca Paul C on: 06/21/2021 11:03 AM   Modules accepted: Orders

## 2021-06-21 NOTE — Patient Instructions (Signed)
  Medication Instructions:  Your physician has recommended you make the following change in your medication:  -- Start Lotrimin Ear Drops - Apply 4 drops to the right ear twice daily for 10-14 days --If you need a refill on any your medications before your next appointment, please call your pharmacy first. If no refills are authorized on file call the office.-- Follow-Up: Your next appointment:   Your physician recommends that you schedule a follow-up appointment in: 10-14 DAYS with Dr. de [place]  Thanks for letting us be apart of your health journey!!  Primary Care and Sports Medicine   Dr. Ceasar Mons [place]   We encourage you to activate your patient portal called "MyChart".  Sign up information is provided on this After Visit Summary.  MyChart is used to connect with patients for Virtual Visits (Telemedicine).  Patients are able to view lab/test results, encounter notes, upcoming appointments, etc.  Non-urgent messages can be sent to your provider as well. To learn more about what you can do with MyChart, please visit --  [email or web address].

## 2021-07-01 ENCOUNTER — Encounter (HOSPITAL_BASED_OUTPATIENT_CLINIC_OR_DEPARTMENT_OTHER): Payer: Self-pay | Admitting: Nurse Practitioner

## 2021-07-01 ENCOUNTER — Other Ambulatory Visit: Payer: Self-pay

## 2021-07-01 ENCOUNTER — Ambulatory Visit (INDEPENDENT_AMBULATORY_CARE_PROVIDER_SITE_OTHER): Payer: 59 | Admitting: Nurse Practitioner

## 2021-07-01 VITALS — BP 146/90 | HR 69 | Ht 65.0 in | Wt 350.2 lb

## 2021-07-01 DIAGNOSIS — H60501 Unspecified acute noninfective otitis externa, right ear: Secondary | ICD-10-CM | POA: Diagnosis not present

## 2021-07-01 DIAGNOSIS — R030 Elevated blood-pressure reading, without diagnosis of hypertension: Secondary | ICD-10-CM | POA: Diagnosis not present

## 2021-07-01 NOTE — Patient Instructions (Addendum)
Recommendations:  Continue ear drops for the next 2 weeks. This should help resolve the itching and infection.  Avoid getting water in your ears. When you shower, you can place a small cotton ball at the opening of the ear canal to help prevent water from entering.  If swimming or putting your head under water, I recommend ear plugs to prevent water from entering the ear.

## 2021-07-01 NOTE — Assessment & Plan Note (Signed)
Small amount of white exudate present in the right ear canal with mild erythema to the canal and TM. Infection appears to be clearing with treatment.  Recommend continue treatment for 10d-2w.  She will f/u if symptoms do not resolve.

## 2021-07-01 NOTE — Assessment & Plan Note (Signed)
BP is elevated in the office today. She has no history of elevated BP other than during pregnancy.  Recommend monitoring BP at home and notify if BP is consistently >130/85. No alarm sx present today.

## 2021-07-01 NOTE — Progress Notes (Signed)
Established Patient Office Visit  Subjective:  Patient ID: Betty Shelton, female    DOB: Jul 30, 1986  Age: 35 y.o. MRN: [id number]  CC:  Chief Complaint  Patient presents with   Follow Up Ear Problem    HPI ISSIS LINDSETH presents for follow-up of otitis externa with suspected fungal etiology.  She was initially started on antibiotic drops with urgent care- treatment failed to resolve after over a week of treatment.  At her last visit, she was prescribed antifungal drops and the ears were irrigated. Today she is following up to ensure treatment success.   Today she reports that her right ear is still "a little itchy", but it does feel better. She has been using the medication as prescribed with no issues.  She has no fever, chills, sore throat, HA.  Past Medical History:  Diagnosis Date   Allergy    Medical history non-contributory    Morbid obesity with BMI of 50.0-59.9, adult [facility])     Past Surgical History:  Procedure Laterality Date   CESAREAN SECTION     CESAREAN SECTION N/A 08/16/2015   Procedure: CESAREAN SECTION;  Surgeon: Tilda Burrow, MD;  Location: [facility];  Service: Obstetrics;  Laterality: N/A;   IUD REMOVAL N/A 11/28/2013   Procedure: INTRAUTERINE DEVICE (IUD) REMOVAL;  Surgeon: Allie Bossier, MD;  Location: [facility];  Service: Gynecology;  Laterality: N/A;   TOOTH EXTRACTION  2014   WISDOM TOOTH EXTRACTION      Family History  Problem Relation Age of Onset   Diabetes Mother    Cancer Maternal Aunt        breast   Diabetes Maternal Grandmother    Hypertension Maternal Grandmother     Social History   Socioeconomic History   Marital status: Single    Spouse name: Not on file   Number of children: Not on file   Years of education: Not on file   Highest education level: Not on file  Occupational History   Not on file  Tobacco Use   Smoking status: Every Day    Packs/day: 0.25    Years: 8.00    Pack years: 2.00    Types: Cigarettes   Smokeless  tobacco: Never  Vaping Use   Vaping Use: Never used  Substance and Sexual Activity   Alcohol use: Yes    Comment: socially   Drug use: No   Sexual activity: Yes  Other Topics Concern   Not on file  Social History Narrative   Not on file   Social Determinants of Health   Financial Resource Strain: Not on file  Food Insecurity: Not on file  Transportation Needs: Not on file  Physical Activity: Not on file  Stress: Not on file  Social Connections: Not on file  Intimate Partner Violence: Not on file    Outpatient Medications Prior to Visit  Medication Sig Dispense Refill   clotrimazole (LOTRIMIN) 1 % external solution 4 drops in the affected ear 2 times a day for 14 days 60 mL 0   doxycycline (VIBRA-TABS) 100 MG tablet Take 1 tablet (100 mg total) by mouth 2 (two) times daily. (Patient not taking: Reported on 07/01/2021) 20 tablet 0   fluconazole (DIFLUCAN) 150 MG tablet Take 1 tablet (150 mg total) by mouth daily. 1 tablet 1   ofloxacin (FLOXIN) 0.3 % OTIC solution Place 10 drops into the right ear daily. (Patient not taking: Reported on 07/01/2021)     spironolactone (ALDACTONE) 25  MG tablet Take 1 tablet (25 mg total) by mouth daily. 30 tablet 11   No facility-administered medications prior to visit.    Allergies  Allergen Reactions   Mucinex D [Pseudoephedrine-Guaifenesin] Hives   Guaifenesin Hives   Mucinex [Guaifenesin Er] Hives and Swelling    ROS Review of Systems All review of systems negative except what is listed in the HPI    Objective:    Physical Exam Vitals and nursing note reviewed.  Constitutional:      Appearance: Normal appearance.  HENT:     Right Ear: Hearing and external ear normal. No swelling or tenderness. No middle ear effusion. No mastoid tenderness. Tympanic membrane is injected.     Left Ear: Tympanic membrane normal.     Ears:     Comments: Mild amount of white exudate consistent with fungal infection clinging to auditory canal.   Cardiovascular:     Rate and Rhythm: Normal rate and regular rhythm.     Pulses: Normal pulses.     Heart sounds: Normal heart sounds.  Pulmonary:     Effort: Pulmonary effort is normal.     Breath sounds: Normal breath sounds.  Neurological:     Mental Status: She is alert.    BP (!) 146/90   Pulse 69   Ht 5\' 5"  (1.651 m)   Wt (!) 350 lb 3.2 oz (158.8 kg)   LMP 06/13/2021   SpO2 100%   BMI 58.28 kg/m  Wt Readings from Last 3 Encounters:  07/01/21 (!) 350 lb 3.2 oz (158.8 kg)  06/21/21 (!) 345 lb 3.2 oz (156.6 kg)  03/28/21 (!) 342 lb 6.4 oz (155.3 kg)     Health Maintenance Due  Topic Date Due   COVID-19 Vaccine (3 - Booster for Pfizer series) 11/28/2020    There are no preventive care reminders to display for this patient.  Lab Results  Component Value Date   TSH 1.421 10/27/2013   Lab Results  Component Value Date   WBC 8.6 02/07/2021   HGB 12.0 02/07/2021   HCT 36.1 02/07/2021   MCV 87.6 02/07/2021   PLT 314 02/07/2021   Lab Results  Component Value Date   NA 135 08/13/2015   K 3.6 08/13/2015   CO2 21 (L) 08/13/2015   GLUCOSE 90 08/13/2015   BUN 7 08/13/2015   CREATININE 0.64 08/13/2015   BILITOT 0.4 08/13/2015   ALKPHOS 128 (H) 08/13/2015   AST 13 (L) 08/13/2015   ALT 8 (L) 08/13/2015   PROT 6.4 (L) 08/13/2015   ALBUMIN 3.0 (L) 08/13/2015   CALCIUM 8.5 (L) 08/13/2015   ANIONGAP 5 08/13/2015   Lab Results  Component Value Date   CHOL 202 (H) 10/27/2013   Lab Results  Component Value Date   HDL 38 (L) 10/27/2013   Lab Results  Component Value Date   LDLCALC 147 (H) 10/27/2013   Lab Results  Component Value Date   TRIG 83 10/27/2013   Lab Results  Component Value Date   CHOLHDL 5.3 10/27/2013   No results found for: HGBA1C    Assessment & Plan:   Problem List Items Addressed This Visit     Otitis externa of right ear - Primary    Small amount of white exudate present in the right ear canal with mild erythema to the canal  and TM. Infection appears to be clearing with treatment.  Recommend continue treatment for 10d-2w.  She will f/u if symptoms do not resolve.  Elevated BP without diagnosis of hypertension    BP is elevated in the office today. She has no history of elevated BP other than during pregnancy.  Recommend monitoring BP at home and notify if BP is consistently >130/85. No alarm sx present today.       No orders of the defined types were placed in this encounter.   Follow-up: Return if symptoms worsen or fail to improve.    Tollie Eth, NP

## 2022-04-25 ENCOUNTER — Encounter (HOSPITAL_BASED_OUTPATIENT_CLINIC_OR_DEPARTMENT_OTHER): Payer: Self-pay | Admitting: Nurse Practitioner

## 2022-04-29 ENCOUNTER — Other Ambulatory Visit (HOSPITAL_BASED_OUTPATIENT_CLINIC_OR_DEPARTMENT_OTHER): Payer: Self-pay | Admitting: Nurse Practitioner

## 2022-04-29 DIAGNOSIS — L732 Hidradenitis suppurativa: Secondary | ICD-10-CM

## 2022-04-29 DIAGNOSIS — N83209 Unspecified ovarian cyst, unspecified side: Secondary | ICD-10-CM

## 2022-07-04 ENCOUNTER — Encounter (HOSPITAL_BASED_OUTPATIENT_CLINIC_OR_DEPARTMENT_OTHER): Payer: Self-pay | Admitting: Nurse Practitioner

## 2022-07-04 ENCOUNTER — Ambulatory Visit (INDEPENDENT_AMBULATORY_CARE_PROVIDER_SITE_OTHER): Payer: 59 | Admitting: Nurse Practitioner

## 2022-07-04 VITALS — BP 142/88 | HR 82 | Ht 65.0 in | Wt 344.0 lb

## 2022-07-04 DIAGNOSIS — N83209 Unspecified ovarian cyst, unspecified side: Secondary | ICD-10-CM

## 2022-07-04 DIAGNOSIS — M25552 Pain in left hip: Secondary | ICD-10-CM | POA: Insufficient documentation

## 2022-07-04 DIAGNOSIS — M533 Sacrococcygeal disorders, not elsewhere classified: Secondary | ICD-10-CM | POA: Diagnosis not present

## 2022-07-04 MED ORDER — PREDNISONE 50 MG PO TABS: 50.0000 mg | ORAL_TABLET | Freq: Every day | ORAL | 0 refills | Status: DC

## 2022-07-04 MED ORDER — MELOXICAM 15 MG PO TABS: 15.0000 mg | ORAL_TABLET | Freq: Every day | ORAL | 0 refills | Status: DC

## 2022-07-04 NOTE — Progress Notes (Signed)
  Orma Render, DNP, AGNP-c Primary Care & Sports Medicine [address]  [place], [place] [address] [phone]  Subjective:   Betty Shelton is a 36 y.o. female presents to day for evaluation of: Acute Visit (Patient presents today for left hip pain since August. Woud like referral for OB/GYN for a cyst on left ovary)  1. Left hip pain Javonne tells me she is not sure if it is her hip or not.  She reports that her back has gone out multiple times over the past several years. She reports this usually occurs when she is doing too much and also tells me this can occur when she is being more sedentary.  She was recently on a work trip 08/06, walking over 10,000 steps a day- her normal is 3, 0000 steps.  She has been to the chiropractor which did not help, then she went on a cruise this past weekend and tells me that she did a lot of walking while on this trip. She reports that sitting on the plane for an extended period of time seemed to make the pain worse.   She shows me it hurts on the left side at the area of the low back.  She has taken ibuprofen and she feels like it helped some.  She has friends who do yoga and a friend is a massage therapist and they gave her stretches to do and this did not help  2. Hemorrhagic ovarian cyst She has a history of an ovarian cyst on the left ovary. She    PMH, Medications, and Allergies reviewed and updated in chart as appropriate.   ROS negative except for what is listed in HPI. Objective:  BP (!) 142/88   Pulse 82   Ht 5\' 5"  (1.651 m)   Wt (!) 344 lb (156 kg)   SpO2 99%   BMI 57.24 kg/m  Physical Exam        Assessment & Plan:   Problem List Items Addressed This Visit     Left hip pain - Primary   Hemorrhagic ovarian cyst      Orma Render, DNP, AGNP-c 07/04/2022  9:59 AM    History, Medications, Surgery, SDOH, and Family History reviewed and updated as appropriate.

## 2022-07-04 NOTE — Patient Instructions (Addendum)
It was a pleasure seeing you today. I hope your time spent with [place] was pleasant and helpful. Please let us know if there is anything we can do to improve the service you receive.   Today we discussed concerns with:  SI Joint Pain I have sent the order for the x-ray of the low back.  I have also sent a medication called Meloxicam which can help. I recommend using ice 20 minutes 4 times a day and heat as needed I will send a referral to orthopedics for you.   Hemorrhagic ovarian cyst If the back results are all normal then we will proceed with imaging to look at the cyst.   Magnesium Citrate- this is a bottle of liquid that you can drink to help with constipation. You can purchase this over the counter at the pharmacy. Drink 1/2 and this will help you have a bowel movement.     Important Office Information Lab Results If labs were ordered, please note that you will see results through [place] as soon as they come available from [place].  It takes up to 5 business days for the results to be routed to me and for me to review them once all of the lab results have come through from [facility]. I will make recommendations based on your results and send these through [place] or someone from the office will call you to discuss. If your labs are abnormal, we may contact you to schedule a visit to discuss the results and make recommendations.  If you have not heard from [place] within 5 business days or you have waited longer than a week and your lab results have not come through on [place], please feel free to call the office or send a message through [place] to follow-up on these labs.   Referrals If referrals were placed today, the office where the referral was sent will contact you either by phone or through [place] to set up scheduling. Please note that it can take up to a week for the referral office to contact you. If you do not hear from them in a week, please contact the referral office directly to  inquire about scheduling.   Condition Treated If your condition worsens or you begin to have new symptoms, please schedule a follow-up appointment for further evaluation. If you are not sure if an appointment is needed, you may call the office to leave a message for the nurse and someone will contact you with recommendations.  If you have an urgent or life threatening emergency, please do not call the office, but seek emergency evaluation by calling 911 or going to the nearest emergency room for evaluation.   MyChart and Phone Calls Please do not use MyChart for urgent messages. It may take up to 3 business days for MyChart messages to be read by staff and if they are unable to handle the request, an additional 3 business days for them to be routed to me and for my response.  Messages sent to the provider through [place] do not come directly to the provider, please allow time for these messages to be routed and for me to respond.  We get a large volume of MyChart messages daily and these are responded to in the order received.   For urgent messages, please call the office at [phone] and speak with the front office staff or leave a message on the line of my assistant for guidance.  We are seeing patients from the hours  of 8:00 am through 5:00 pm and calls directly to the nurse may not be answered immediately due to seeing patients, but your call will be returned as soon as possible.  Phone  messages received after 4:00 PM Monday through Thursday may not be returned until the following business day. Phone messages received after 11:00 AM on Friday may not be returned until Monday.   After Hours We share on call hours with providers from other offices. If you have an urgent need after hours that cannot wait until the next business day, please contact the on call provider by calling the office number. A nurse will speak with you and contact the provider if needed for recommendations.  If you have  an urgent or life threatening emergency after hours, please do not call the on call provider, but seek emergency evaluation by calling 911 or going to the nearest emergency room for evaluation.   Paperwork All paperwork requires a minimum of 5 days to complete and return to you or the designated personnel. Please keep this in mind when bringing in forms or sending requests for paperwork completion to the office.

## 2022-07-07 NOTE — Assessment & Plan Note (Signed)
Symptoms and presentation consistent with SI joint dysfunction.  Discussion with patient on recommendations for stretching exercises that may be helpful to relieve some of her pain.  We will send for meloxicam for anti-inflammatory action.  We will also obtain lumbar x-rays today to ensure that there are no other concerning findings present.  Referral placed to orthopedics for further recommendations.

## 2022-07-07 NOTE — Assessment & Plan Note (Signed)
Initially detected in May of this year.  No tenderness present today.  She expresses concern over continued presence of the cyst and requesting GYN evaluation.  Referral placed today.

## 2022-07-10 ENCOUNTER — Ambulatory Visit (INDEPENDENT_AMBULATORY_CARE_PROVIDER_SITE_OTHER): Payer: 59 | Admitting: Orthopaedic Surgery

## 2022-07-10 DIAGNOSIS — G8929 Other chronic pain: Secondary | ICD-10-CM | POA: Diagnosis not present

## 2022-07-10 DIAGNOSIS — M533 Sacrococcygeal disorders, not elsewhere classified: Secondary | ICD-10-CM

## 2022-07-10 NOTE — Progress Notes (Signed)
Chief Complaint: Left SI joint pain     History of Present Illness:    Betty Shelton is a 36 y.o. female presents today for follow-up of her left-sided lower back pain.  She states that she did have this prior to her recent cruise although this worsened this significantly.  She has been on prednisone and Mobic for this which is helped somewhat but not completely taken away.  She has gone to a chiropractor as well but this has not completely resolved her pain.  She is having difficulty lifting her children.  She did walk on sand in the recent the strip which she believes the pain is worse after that    Surgical History:   none  PMH/PSH/Family History/Social History/Meds/Allergies:    Past Medical History:  Diagnosis Date   Allergy    Medical history non-contributory    Morbid obesity with BMI of 50.0-59.9, adult [facility])    Past Surgical History:  Procedure Laterality Date   CESAREAN SECTION     CESAREAN SECTION N/A 08/16/2015   Procedure: CESAREAN SECTION;  Surgeon: Tilda Burrow, MD;  Location: [facility];  Service: Obstetrics;  Laterality: N/A;   IUD REMOVAL N/A 11/28/2013   Procedure: INTRAUTERINE DEVICE (IUD) REMOVAL;  Surgeon: Allie Bossier, MD;  Location: [facility];  Service: Gynecology;  Laterality: N/A;   TOOTH EXTRACTION  2014   WISDOM TOOTH EXTRACTION     Social History   Socioeconomic History   Marital status: Single    Spouse name: Not on file   Number of children: Not on file   Years of education: Not on file   Highest education level: Not on file  Occupational History   Not on file  Tobacco Use   Smoking status: Every Day    Packs/day: 0.25    Years: 8.00    Total pack years: 2.00    Types: Cigarettes   Smokeless tobacco: Never  Vaping Use   Vaping Use: Never used  Substance and Sexual Activity   Alcohol use: Yes    Comment: socially   Drug use: No   Sexual activity: Yes  Other Topics Concern   Not on file  Social  History Narrative   Not on file   Social Determinants of Health   Financial Resource Strain: Not on file  Food Insecurity: Not on file  Transportation Needs: Not on file  Physical Activity: Not on file  Stress: Not on file  Social Connections: Not on file   Family History  Problem Relation Age of Onset   Diabetes Mother    Cancer Maternal Aunt        breast   Diabetes Maternal Grandmother    Hypertension Maternal Grandmother    Allergies  Allergen Reactions   Mucinex D [Pseudoephedrine-Guaifenesin] Hives   Guaifenesin Hives   Mucinex [Guaifenesin Er] Hives and Swelling   Current Outpatient Medications  Medication Sig Dispense Refill   clotrimazole (LOTRIMIN) 1 % external solution 4 drops in the affected ear 2 times a day for 14 days (Patient not taking: Reported on 07/04/2022) 60 mL 0   doxycycline (VIBRA-TABS) 100 MG tablet Take 1 tablet (100 mg total) by mouth 2 (two) times daily. (Patient not taking: Reported on 07/01/2021) 20 tablet 0   fluconazole (DIFLUCAN) 150 MG tablet Take 1  tablet (150 mg total) by mouth daily. (Patient not taking: Reported on 07/04/2022) 1 tablet 1   meloxicam (MOBIC) 15 MG tablet Take 1 tablet (15 mg total) by mouth daily. 30 tablet 0   ofloxacin (FLOXIN) 0.3 % OTIC solution Place 10 drops into the right ear daily. (Patient not taking: Reported on 07/01/2021)     predniSONE (DELTASONE) 50 MG tablet Take 1 tablet (50 mg total) by mouth daily with breakfast. 5 tablet 0   spironolactone (ALDACTONE) 25 MG tablet Take 1 tablet (25 mg total) by mouth daily. (Patient not taking: Reported on 07/04/2022) 30 tablet 11   No current facility-administered medications for this visit.   No results found.  Review of Systems:   A ROS was performed including pertinent positives and negatives as documented in the HPI.  Physical Exam :   Constitutional: NAD and appears stated age Neurological: Alert and oriented Psych: Appropriate affect and cooperative There were  no vitals taken for this visit.   Comprehensive Musculoskeletal Exam:    TTP left SI joint, there is no pain radiating down the side.  No pain in the lower lumbar spine.  Progressive exam is distant neurovascularly intact  Imaging:    I personally reviewed and interpreted the radiographs.   Assessment:   36 y.o. female with left SI joint pain.  At this time I have given her options including medication versus steroid injection.  At this time she is elected for a fluoroscopy guided SI injection.  We will plan on referring her to Dr. Ernestina Patches for this.  Plan :    -Plan to referral for Dr. Ernestina Patches for left SI guided injection     I personally saw and evaluated the patient, and participated in the management and treatment plan.  Vanetta Mulders, MD Attending Physician, Orthopedic Surgery  This document was dictated using Dragon voice recognition software. A reasonable attempt at proof reading has been made to minimize errors.

## 2022-07-29 ENCOUNTER — Ambulatory Visit (INDEPENDENT_AMBULATORY_CARE_PROVIDER_SITE_OTHER): Payer: 59 | Admitting: Physical Medicine and Rehabilitation

## 2022-07-29 ENCOUNTER — Ambulatory Visit: Payer: Self-pay

## 2022-07-29 VITALS — BP 115/82 | HR 72

## 2022-07-29 DIAGNOSIS — M461 Sacroiliitis, not elsewhere classified: Secondary | ICD-10-CM | POA: Diagnosis not present

## 2022-07-29 NOTE — Progress Notes (Signed)
Numeric Pain Rating Scale and Functional Assessment Average Pain 1   In the last MONTH (on 0-10 scale) has pain interfered with the following?  1. General activity like being  able to carry out your everyday physical activities such as walking, climbing stairs, carrying groceries, or moving a chair?  Rating(4)   +Driver, -BT, -Dye Allergies.  Pain gets worse with movements, standing, sitting for long periods of time, bending. No radiation to legs.

## 2022-07-29 NOTE — Progress Notes (Signed)
Betty Shelton - 36 y.o. female MRN [id number]  Date of birth: [birth year]  Office Visit Note: Visit Date: 07/29/2022 PCP: Tollie Eth, NP Referred by: Tollie Eth, NP  Subjective: Chief Complaint  Patient presents with   Lower Back - Pain   HPI:  Betty Shelton is a 36 y.o. female who comes in today at the request of Dr. Huel Cote for planned Left anesthetic Sacroiliac joint arthrogram with fluoroscopic guidance.  The patient has failed conservative care including home exercise, medications, time and activity modification.  This injection will be diagnostic and hopefully therapeutic.  Please see requesting physician notes for further details and justification.   Positive  Patrick's testing, Gaenslen's  and lateral compression test.    ROS Otherwise per HPI.  Assessment & Plan: Visit Diagnoses:    ICD-10-CM   1. Sacroiliitis (HCC)  M46.1 Sacroiliac Joint Inj    XR C-ARM NO REPORT      Plan: No additional findings.   Meds & Orders: No orders of the defined types were placed in this encounter.   Orders Placed This Encounter  Procedures   Sacroiliac Joint Inj   XR C-ARM NO REPORT    Follow-up: No follow-ups on file.   Procedures: Sacroiliac Joint Inj on 07/29/2022 2:20 PM Indications: pain and diagnostic evaluation Details: 22 G 3.5 in needle, fluoroscopy-guided posterior approach Medications: 2 mL bupivacaine 0.5 %; 80 mg methylPREDNISolone acetate 80 MG/ML; 40 mg methylPREDNISolone acetate 40 MG/ML Outcome: tolerated well, no immediate complications  Sacroiliac Joint Intra-Articular Injection - Posterior Approach with Fluoroscopic Guidance   Position: PRONE  Additional Comments: Vital signs were monitored before and after the procedure. Patient was prepped and draped in the usual sterile fashion. The correct patient, procedure, and site was verified.   Injection Procedure Details:   Location/Site:  Sacroiliac joint  Needle size: 3.5 in Spinal  Needle  Needle type: Spinal  Needle Placement: Intra-articular  Findings:  -Comments: There was excellent flow of contrast producing a partial arthrogram of the sacroiliac joint.   Procedure Details: Starting with a 90 degree vertical and midline orientation the fluoroscope was tilted cranially 20 to 25 degrees and the target area of the inferior most part of the SI joint on the side mentioned above was visualized.  The soft tissues overlying this target were infiltrated with 4 ml. of 1% Lidocaine without Epinephrine. A #22 gauge spinal needle was inserted perpendicular to the fluoroscope table and advanced into the posterior inferior joint space using fluoroscopic guidance.  Position in the joint space was confirmed by obtaining a partial arthrogram using a 2 ml. volume of Isovue-250 contrast agent. After negative aspirate for gross pus or blood, the injectate was delivered to the joint. Radiographs were obtained for documentation purposes.   Additional Comments:   Dressing: Bandaid    Post-procedure details: Patient was observed during the procedure. Post-procedure instructions were reviewed.  Patient left the clinic in stable condition.    There was excellent flow of contrast producing a partial arthrogram of the sacroiliac joint.  Procedure, treatment alternatives, risks and benefits explained, specific risks discussed. Consent was given by the patient. Immediately prior to procedure a time out was called to verify the correct patient, procedure, equipment, support staff and site/side marked as required. Patient was prepped and draped in the usual sterile fashion.          Clinical History: No specialty comments available.     Objective:  VS:  HT:  WT:   BMI:     BP:115/82  HR:72bpm  TEMP: ( )  RESP:  Physical Exam   Imaging: No results found.

## 2022-07-29 NOTE — Patient Instructions (Signed)

## 2022-08-07 ENCOUNTER — Encounter (HOSPITAL_BASED_OUTPATIENT_CLINIC_OR_DEPARTMENT_OTHER): Payer: Self-pay | Admitting: Nurse Practitioner

## 2022-08-07 ENCOUNTER — Ambulatory Visit (HOSPITAL_BASED_OUTPATIENT_CLINIC_OR_DEPARTMENT_OTHER): Payer: 59 | Admitting: Nurse Practitioner

## 2022-08-07 VITALS — BP 157/82 | HR 85 | Ht 65.0 in | Wt 335.2 lb

## 2022-08-07 DIAGNOSIS — M545 Low back pain, unspecified: Secondary | ICD-10-CM

## 2022-08-07 DIAGNOSIS — N83202 Unspecified ovarian cyst, left side: Secondary | ICD-10-CM | POA: Diagnosis not present

## 2022-08-07 NOTE — Progress Notes (Addendum)
Worthy Keeler, DNP, AGNP-c [address] [place], [place] [address] [phone] Office [phone] Fax  ESTABLISHED PATIENT- Chronic Health and/or Follow-Up Visit  Blood pressure (!) 157/82, pulse 85, height 5\' 5"  (1.651 m), weight (!) 335 lb 3.2 oz (152 kg), SpO2 100 %.    Betty Shelton is a 36 y.o. year old female presenting today for evaluation and management of the following: Follow-up (Pt presents today for cyst left ovary )  Left Ovarian Cyst Patient expresses concerns over cyst on left ovary. She tells me that she initially had been bleeding with large clots during her period. She went to the ED thinking that maybe she was having a miscarriage, but they found that she was not pregnant. A transvaginal ultrasound was done and an ovarian cyst was found. She had a repeat ultrasound in May and the cyst was smaller.  She tells me that starting the beginning of August she was having severe pain in the lower back on the left side near the SI joint. She tells me she is feeling the pain at all times. She has been to the chiropractor and has had an injection in the SI joint on 10/17. She tells me that the injection "tamed" the pain a little bit. She tells me that sitting in her desk chair brought the pain back immediately.  She has not had any irregular or heavy bleeding lately.  She tells me that she wakes up sweating and with chills recently, but does not know if she has had a fever. No nausea, vomiting, or diarrhea. No foul urine, unusual vaginal discharge, or new sexual partners.   She should start her period on Sunday.   She tells me that her period pain has been harder than usual and this is unusual for her. She does not feel that the pain in her low back is any worse at any particular time of the month.   She tells me that laying down and not moving is the only thing that makes it better. She hasn't tried heat to the area.  She has tried ice and this didn't help.    She tells me she cannot stand for long periods, she cannot bend over, and she cannot get out of bed without pain.  All ROS negative with exception of what is listed above.   PHYSICAL EXAM Physical Exam Vitals and nursing note reviewed.  Constitutional:      General: She is not in acute distress.    Appearance: Normal appearance.  HENT:     Head: Normocephalic.  Eyes:     Extraocular Movements: Extraocular movements intact.     Conjunctiva/sclera: Conjunctivae normal.     Pupils: Pupils are equal, round, and reactive to light.  Neck:     Vascular: No carotid bruit.  Cardiovascular:     Rate and Rhythm: Normal rate and regular rhythm.     Pulses: Normal pulses.     Heart sounds: Normal heart sounds. No murmur heard. Pulmonary:     Effort: Pulmonary effort is normal.     Breath sounds: Normal breath sounds. No wheezing.  Abdominal:     General: Bowel sounds are normal. There is no distension.     Palpations: Abdomen is soft.     Tenderness: There is abdominal tenderness. There is no guarding.     Comments: Suprapubic tenderness on palpation.   Musculoskeletal:        General: Tenderness present. Normal  range of motion.     Cervical back: Normal range of motion and neck supple.     Right lower leg: No edema.     Left lower leg: No edema.     Comments: Lumbar pain with movement. No obvious abnormality noted on exam.  Lymphadenopathy:     Cervical: No cervical adenopathy.  Skin:    General: Skin is warm and dry.     Capillary Refill: Capillary refill takes less than 2 seconds.  Neurological:     General: No focal deficit present.     Mental Status: She is alert and oriented to person, place, and time.     Gait: Gait abnormal.  Psychiatric:        Mood and Affect: Mood normal.        Behavior: Behavior normal.        Thought Content: Thought content normal.        Judgment: Judgment normal.     PLAN Problem List Items  Addressed This Visit     Hemorrhagic ovarian cyst - Primary   Other Visit Diagnoses     Acute left-sided low back pain without sciatica       Relevant Orders   DG Lumbar Spine Complete (Completed)     [place] pending for ovarian cyst. Will determine f/u based on ultrasound. No alarm sx present.  Will obtain x-ray of lumbar spine. Stretching, ice, heat, and NSAIDs recommended. Consider PT.   Return for based on ultrasound.   Shawna Clamp, DNP, AGNP-c 08/07/2022  1:59 PM

## 2022-08-07 NOTE — Patient Instructions (Signed)
[facility] Located inside [facility] on the first floor of the building Address: [address], [place], [place] [address] Hours: Monday - Friday  8:00 am - 5:00 pm Phone: [phone] [facility]

## 2022-08-08 ENCOUNTER — Ambulatory Visit
Admission: RE | Admit: 2022-08-08 | Discharge: 2022-08-08 | Disposition: A | Discharge status: Home / Self Care | Payer: 59 | Source: Ambulatory Visit | Attending: Nurse Practitioner | Admitting: Nurse Practitioner

## 2022-08-08 DIAGNOSIS — N83202 Unspecified ovarian cyst, left side: Secondary | ICD-10-CM

## 2022-08-08 DIAGNOSIS — M545 Low back pain, unspecified: Secondary | ICD-10-CM

## 2022-08-10 MED ORDER — METHYLPREDNISOLONE ACETATE 80 MG/ML IJ SUSP
80.0000 mg | INTRAMUSCULAR | Status: AC | PRN
  Administered 2022-07-29: 80 mg via INTRA_ARTICULAR

## 2022-08-10 MED ORDER — BUPIVACAINE HCL 0.5 % IJ SOLN
2.0000 mL | INTRAMUSCULAR | Status: AC | PRN
  Administered 2022-07-29: 2 mL via INTRA_ARTICULAR

## 2022-08-10 MED ORDER — METHYLPREDNISOLONE ACETATE 40 MG/ML IJ SUSP
40.0000 mg | INTRAMUSCULAR | Status: AC | PRN
  Administered 2022-07-29: 40 mg via INTRA_ARTICULAR

## 2022-08-15 ENCOUNTER — Encounter: Payer: Self-pay | Admitting: Radiology

## 2022-08-15 ENCOUNTER — Other Ambulatory Visit (HOSPITAL_COMMUNITY)
Admission: RE | Admit: 2022-08-15 | Discharge: 2022-08-15 | Disposition: A | Discharge status: Home / Self Care | Payer: 59 | Source: Ambulatory Visit | Attending: Radiology | Admitting: Radiology

## 2022-08-15 ENCOUNTER — Ambulatory Visit (INDEPENDENT_AMBULATORY_CARE_PROVIDER_SITE_OTHER): Payer: 59 | Admitting: Radiology

## 2022-08-15 VITALS — BP 126/84 | Ht 65.0 in | Wt 334.0 lb

## 2022-08-15 DIAGNOSIS — Z01419 Encounter for gynecological examination (general) (routine) without abnormal findings: Secondary | ICD-10-CM

## 2022-08-15 DIAGNOSIS — Z113 Encounter for screening for infections with a predominantly sexual mode of transmission: Secondary | ICD-10-CM | POA: Insufficient documentation

## 2022-08-15 DIAGNOSIS — L732 Hidradenitis suppurativa: Secondary | ICD-10-CM

## 2022-08-15 NOTE — Progress Notes (Signed)
Betty Shelton [birth year] VC:[id number]   History:  36 y.o. G3P2 presents for annual exam. Struggling with lower back pain, began after walking a far distance on sand. Elects for STI screen, due for pap. Declines BC, uses cycle tracking and coitus interruptus.  Gynecologic History Patient's last menstrual period was 08/09/2022 (exact date). Period Cycle (Days): 26 Period Duration (Days): 7 Period Pattern: Regular Menstrual Flow: Moderate (varies light to heavy) Menstrual Control: Maxi pad Dysmenorrhea: (!) Moderate Dysmenorrhea Symptoms: Cramping Contraception/Family planning: rhythm method Sexually active: yes Last Pap: 2019. Results were: normal   Obstetric History OB History  Gravida Para Term Preterm AB Living  3 2 2  0 1 2  SAB IAB Ectopic Multiple Live Births  1 0 0 0 2    # Outcome Date GA Lbr Len/2nd Weight Sex Delivery Anes PTL Lv  3 Term 08/16/15 [redacted]w[redacted]d  7 lb 10.9 oz (3.485 kg) M CS-LTranv EPI  LIV  2 Term 07/28/09 [redacted]w[redacted]d  5 lb 10 oz (2.551 kg) F CS-LTranv  N LIV  1 SAB              The following portions of the patient's history were reviewed and updated as appropriate: allergies, current medications, past family history, past medical history, past social history, past surgical history, and problem list.  Review of Systems Pertinent items noted in HPI and remainder of comprehensive ROS otherwise negative.   Past medical history, past surgical history, family history and social history were all reviewed and documented in the EPIC chart.   Exam:  Vitals:   08/15/22 1445  BP: 126/84  Weight: (!) 334 lb (151.5 kg)  Height: 5\' 5"  (1.651 m)   Body mass index is 55.58 kg/m.  General appearance:  Normal, morbidly obese Thyroid:  Symmetrical, normal in size, without palpable masses or nodularity. Respiratory  Auscultation:  Clear without wheezing or rhonchi Cardiovascular  Auscultation:  Regular rate, without rubs, murmurs or gallops  Edema/varicosities:  Not  grossly evident Abdominal  Soft,nontender, without masses, guarding or rebound.  Liver/spleen:  No organomegaly noted  Hernia:  None appreciated  Skin  Inspection:  Grossly normal Scarring from hidradenitis under breasts and axillae, no inflamed areas Breasts: Examined lying and sitting.   Right: Without masses, retractions, nipple discharge or axillary adenopathy.   Left: Without masses, retractions, nipple discharge or axillary adenopathy. Genitourinary   Inguinal/mons:  Normal without inguinal adenopathy  External genitalia:  Normal appearing vulva with no masses, tenderness, or lesions  BUS/Urethra/Skene's glands:  Normal without masses or exudate  Vagina:  Normal appearing with normal color and discharge, no lesions  Cervix:  Normal appearing without discharge or lesions  Uterus:  Normal in size, shape and contour.  Mobile, nontender  Adnexa/parametria:     Rt: Normal in size, without masses or tenderness.   Lt: Normal in size, without masses or tenderness.  Anus and perineum: Normal   Patient informed chaperone available to be present for breast and pelvic exam. Patient has requested no chaperone to be present. Patient has been advised what will be completed during breast and pelvic exam.   Assessment/Plan:   1. Well woman exam with routine gynecological exam - Cytology - PAP( [place])  2. Screening for STDs (sexually transmitted diseases) - HIV antibody (with reflex) - RPR - Hepatitis C antibody - Hepatitis B Surface AntiGEN - Cytology - PAP( [place])  3. Hidradenitis suppurativa Discussed prevention May try pan oxyl or hibiclens Weight loss   Discussed SBE,  pap and STI screening as directed/appropriate. Recommend 132mins of exercise weekly, including weight bearing exercise. Encouraged the use of seatbelts and sunscreen. Return in 1 year for annual or as needed.   Rubbie Battiest B WHNP-BC 3:15 PM 08/15/2022

## 2022-08-18 ENCOUNTER — Encounter (HOSPITAL_BASED_OUTPATIENT_CLINIC_OR_DEPARTMENT_OTHER): Payer: Self-pay | Admitting: Nurse Practitioner

## 2022-08-18 DIAGNOSIS — M5136 Other intervertebral disc degeneration, lumbar region: Secondary | ICD-10-CM

## 2022-08-18 DIAGNOSIS — M25552 Pain in left hip: Secondary | ICD-10-CM

## 2022-08-18 DIAGNOSIS — M48061 Spinal stenosis, lumbar region without neurogenic claudication: Secondary | ICD-10-CM

## 2022-08-18 DIAGNOSIS — M4726 Other spondylosis with radiculopathy, lumbar region: Secondary | ICD-10-CM

## 2022-08-18 LAB — HIV ANTIBODY (ROUTINE TESTING W REFLEX): HIV 1&2 Ab, 4th Generation: NONREACTIVE

## 2022-08-18 LAB — HEPATITIS C ANTIBODY: Hepatitis C Ab: NONREACTIVE

## 2022-08-18 LAB — RPR: RPR Ser Ql: NONREACTIVE

## 2022-08-18 LAB — HEPATITIS B SURFACE ANTIGEN: Hepatitis B Surface Ag: NONREACTIVE

## 2022-08-20 ENCOUNTER — Other Ambulatory Visit: Payer: Self-pay | Admitting: *Deleted

## 2022-08-20 LAB — CYTOLOGY - PAP
Chlamydia: NEGATIVE
Comment: NEGATIVE
Comment: NEGATIVE
Comment: NEGATIVE
Comment: NORMAL
Diagnosis: NEGATIVE
High risk HPV: NEGATIVE
Neisseria Gonorrhea: NEGATIVE
Trichomonas: POSITIVE — AB

## 2022-08-20 MED ORDER — METRONIDAZOLE 500 MG PO TABS: 500.0000 mg | ORAL_TABLET | Freq: Two times a day (BID) | ORAL | 0 refills | Status: DC

## 2022-08-25 ENCOUNTER — Encounter (HOSPITAL_BASED_OUTPATIENT_CLINIC_OR_DEPARTMENT_OTHER): Payer: Self-pay | Admitting: Physical Therapy

## 2022-08-25 ENCOUNTER — Ambulatory Visit (HOSPITAL_BASED_OUTPATIENT_CLINIC_OR_DEPARTMENT_OTHER): Payer: 59 | Attending: Nurse Practitioner | Admitting: Physical Therapy

## 2022-08-25 ENCOUNTER — Other Ambulatory Visit: Payer: Self-pay

## 2022-08-25 DIAGNOSIS — M48061 Spinal stenosis, lumbar region without neurogenic claudication: Secondary | ICD-10-CM | POA: Insufficient documentation

## 2022-08-25 DIAGNOSIS — M5136 Other intervertebral disc degeneration, lumbar region: Secondary | ICD-10-CM | POA: Insufficient documentation

## 2022-08-25 DIAGNOSIS — M4726 Other spondylosis with radiculopathy, lumbar region: Secondary | ICD-10-CM | POA: Diagnosis not present

## 2022-08-25 DIAGNOSIS — M62830 Muscle spasm of back: Secondary | ICD-10-CM

## 2022-08-25 DIAGNOSIS — M5459 Other low back pain: Secondary | ICD-10-CM

## 2022-08-25 DIAGNOSIS — M25552 Pain in left hip: Secondary | ICD-10-CM | POA: Insufficient documentation

## 2022-08-25 NOTE — Therapy (Unsigned)
OUTPATIENT PHYSICAL THERAPY THORACOLUMBAR EVALUATION   Patient Name: Betty Shelton MRN: [id number] DOB:Feb 04, 1986, 36 y.o., female Today's Date: 08/26/2022   PT End of Session - 08/25/22 1417     Visit Number 1    Number of Visits 12    Date for PT Re-Evaluation 10/06/22    PT Start Time 1230    PT Stop Time 1312    PT Time Calculation (min) 42 min    Activity Tolerance Patient tolerated treatment well    Behavior During Therapy [facility] for tasks assessed/performed             Past Medical History:  Diagnosis Date   Allergy    Back pain    back pain began 05/2022   Medical history non-contributory    Morbid obesity with BMI of 50.0-59.9, adult [facility])    Past Surgical History:  Procedure Laterality Date   CESAREAN SECTION     CESAREAN SECTION N/A 08/16/2015   Procedure: CESAREAN SECTION;  Surgeon: Tilda Burrow, MD;  Location: [facility];  Service: Obstetrics;  Laterality: N/A;   IUD REMOVAL N/A 11/28/2013   Procedure: INTRAUTERINE DEVICE (IUD) REMOVAL;  Surgeon: Allie Bossier, MD;  Location: [facility];  Service: Gynecology;  Laterality: N/A;   TOOTH EXTRACTION  2014   WISDOM TOOTH EXTRACTION     Patient Active Problem List   Diagnosis Date Noted   Left hip pain 07/04/2022   Elevated BP without diagnosis of hypertension 07/01/2021   Otitis externa of right ear 06/21/2021   Hemorrhagic ovarian cyst 03/28/2021   Encounter to establish care 03/28/2021   Hidradenitis suppurativa 03/28/2021   Anxiety and depression 03/28/2021   Depression 07/23/2017   Women's annual routine gynecological examination 07/23/2017   Smoker 02/28/2016    PCP: Di Kindle Early NP  REFERRING PROVIDER: Les Pou Early NP   REFERRING DIAG:  [phone] (ICD-10-CM) - Left hip pain  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  The pain originates in her back but radiates down  into her left hip can have increased pain sitting for period of time or with extended time walking.  Of note she also has a ovarian cyst. She is on a muscle relaxer. She is having difficulty sitting up for any period of time now. She was feeling like when she lies down she   PERTINENT HISTORY:  Low back pain;  Morbid obesity  PAIN:  Are you having pain? Yes: NPRS scale: 7/10 Pain location: Left lumbar spine Pain description: Aching Aggravating factors: sitting and walking  Relieving factors: rest   PRECAUTIONS: None  WEIGHT BEARING RESTRICTIONS: No  FALLS:  Has patient fallen in last 6 months? No  LIVING ENVIRONMENT: 3 steps into the house  OCCUPATION:  Work as an Air cabin crew   Hobbies: Production assistant, radio ; walking      PLOF: Independent  PATIENT GOALS:  Do get back to living her life   NEXT MD VISIT:   OBJECTIVE:   DIAGNOSTIC FINDINGS:  Low back X-ray: IMPRESSION: Mild degenerative disc and joint disease at L5-S1 with mild neuroforaminal stenosis at this level. PATIENT SURVEYS:  FOTO    SCREENING FOR RED FLAGS: Bowel or bladder incontinence: No Spinal tumors: No Cauda equina syndrome: No Compression fracture: No Abdominal aneurysm: No  COGNITION: Overall cognitive status: Within functional limits for tasks assessed     SENSATION: Pain can radiate down the back of the leg  MUSCLE LENGTH:  POSTURE: No Significant postural  limitations  PALPATION: Significant spasming in left upper gluteal and into the left lower lumbar spine  LUMBAR ROM:   AROM eval  Flexion Full flexion but painful coming back from flexion  Extension No pain  Right lateral flexion No pain  Left lateral flexion No pain  Right rotation Painful on the left side limited 50%  Left rotation Limited 25% but no pain   (Blank rows = not tested)  LOWER EXTREMITY ROM:     Passive  Right eval Left eval  Hip flexion full Painful past 80 degrees  Hip extension    Hip abduction Full    Hip adduction    Hip internal rotation Full Painful past 5 degrees  Hip external rotation Full No pain  Knee flexion    Knee extension    Ankle dorsiflexion    Ankle plantarflexion    Ankle inversion    Ankle eversion     (Blank rows = not tested)  LOWER EXTREMITY MMT:    MMT Right eval Left eval  Hip flexion 35 25.1  Hip extension    Hip abduction 37.9 41.2  Hip adduction    Hip internal rotation    Hip external rotation    Knee flexion    Knee extension 46.4 45.4  Ankle dorsiflexion    Ankle plantarflexion    Ankle inversion    Ankle eversion     (Blank rows = not tested)   GAIT: Mild lateral motion noted with ambulation TODAY'S TREATMENT:  DATE:   MedBridge not available but printed paper HEP given  Lying on your stomach prop up on the elbows 4-5 minutes if able  Ball stretches at the table 20-30 sec hold   Tennis ball    PATIENT EDUCATION:  Education details: HEP, symptom management, anatomy of condition, benefits of prone positioning, Person educated: Patient Education method: Explanation, Demonstration, Tactile cues, Verbal cues, and Handouts Education comprehension: verbalized understanding, returned demonstration, verbal cues required, tactile cues required, and needs further education  HOME  EXERCISE PROGRAM: Lying on your stomach prop up on the elbows 4-5 minutes if able  Ball stretches at the table 20-30 sec hold   Tennis ball   ASSESSMENT:  CLINICAL IMPRESSION: Patient is a 36 year old female who presents to physical therapy with onset of lower back pain.  Lower back pain has been chronic and episodic in the past but recently has become more consistent since September 2023.  She presents with hip flexor weakness on the left side, significant spasming in the left gluteal, limited hip passive range of motion into flexion and internal rotation on the left.  She would benefit from skilled therapy including aquatic therapy to improve pain and function in order to allow her to perform community activity.  Patient responded well to prone positioning.  She sleeps on her stomach at night.  OBJECTIVE IMPAIRMENTS: Abnormal gait, decreased activity tolerance, decreased endurance, difficulty walking, decreased ROM, decreased strength, increased fascial restrictions, obesity, and pain.   ACTIVITY LIMITATIONS: carrying, lifting, bending, sitting, standing, stairs, transfers, and locomotion level  PARTICIPATION LIMITATIONS: meal prep, cleaning, laundry, driving, shopping, occupation, and yard work  PERSONAL FACTORS: 1-2 comorbidities: morbid obesity   are also affecting patient's functional outcome.   REHAB POTENTIAL: Good  CLINICAL DECISION MAKING: Evolving/moderate complexity because of the loss of mobility over this in September  EVALUATION COMPLEXITY: Moderate   GOALS: Goals reviewed with patient? Yes  SHORT TERM GOALS: Target date: 09/16/2022  Patient will increase left hip flexion to 100 degrees Baseline: Goal status: INITIAL  2.  Patient will increase right lumbar rotation by 25% Baseline:  Goal status: INITIAL  3.  Patient will increase left hip flexor strength by 7 pounds Baseline:  Goal status: INITIAL    LONG TERM GOALS: Target date: 10/07/2022  Patient will  sit at work for greater than 1 hour without increased low back pain Baseline:  Goal status: INITIAL  2.  Patient will walk 1000 feet without increased low back pain Baseline:  Goal status: INITIAL  3.  Patient will have full aquatics and land based exercise program Baseline:  Goal status: INITIAL    PLAN:  PT FREQUENCY: 1-2x/week  PT DURATION: 6 weeks  PLANNED INTERVENTIONS: Therapeutic exercises, Therapeutic activity, Neuromuscular re-education, Balance training, Gait training, Patient/Family education, Self Care, Joint mobilization, Stair training, DME instructions, Aquatic Therapy, Electrical stimulation, Cryotherapy, Moist heat, Ultrasound, and Manual therapy.  PLAN FOR NEXT SESSION: Begin with manual therapy to the left gluteal if tolerated, review HEP, give MedBridge HEP's MedBridge was not available, consider LAD to left lower extremity but patient may have some difficulty because she has difficulty lying supine review prone positioning and if it helped, consider dry needling no education given so, far in the pool work on hip and core strengthening   Dessie Coma, PT 08/26/2022, 8:47 AM

## 2022-08-26 ENCOUNTER — Encounter (HOSPITAL_BASED_OUTPATIENT_CLINIC_OR_DEPARTMENT_OTHER): Payer: Self-pay | Admitting: Physical Therapy

## 2022-08-28 ENCOUNTER — Telehealth: Payer: Self-pay | Admitting: *Deleted

## 2022-08-28 NOTE — Telephone Encounter (Signed)
Patient informed. 

## 2022-08-28 NOTE — Telephone Encounter (Signed)
Likely the infection coming out. If it persists over the weekend come in for wet prep

## 2022-08-28 NOTE — Telephone Encounter (Signed)
Patient was treated for Trichomonas on 08/21/22 completed medication yesterday. Patient called c/o discharge brown-yellowish color with mild odor. She reports prior to taking Rx she didn't have any symptoms of odor or discharge. She asked if this is normal? Please advise

## 2022-09-01 ENCOUNTER — Other Ambulatory Visit (HOSPITAL_BASED_OUTPATIENT_CLINIC_OR_DEPARTMENT_OTHER): Payer: Self-pay | Admitting: Nurse Practitioner

## 2022-09-01 DIAGNOSIS — M533 Sacrococcygeal disorders, not elsewhere classified: Secondary | ICD-10-CM

## 2022-09-02 ENCOUNTER — Encounter (HOSPITAL_BASED_OUTPATIENT_CLINIC_OR_DEPARTMENT_OTHER): Payer: Self-pay | Admitting: Physical Therapy

## 2022-09-02 ENCOUNTER — Ambulatory Visit (HOSPITAL_BASED_OUTPATIENT_CLINIC_OR_DEPARTMENT_OTHER): Payer: 59 | Admitting: Physical Therapy

## 2022-09-02 DIAGNOSIS — M25552 Pain in left hip: Secondary | ICD-10-CM | POA: Diagnosis not present

## 2022-09-02 DIAGNOSIS — M5459 Other low back pain: Secondary | ICD-10-CM

## 2022-09-02 DIAGNOSIS — M62830 Muscle spasm of back: Secondary | ICD-10-CM

## 2022-09-02 NOTE — Therapy (Signed)
OUTPATIENT PHYSICAL THERAPY THORACOLUMBAR EVALUATION   Patient Name: Betty Shelton MRN: [id number] DOB:10-11-1986, 36 y.o., female Today's Date: 09/02/2022   PT End of Session - 09/02/22 1537     Visit Number 2    Number of Visits 12    Date for PT Re-Evaluation 10/06/22    PT Start Time [id number]    PT Stop Time [id number]    PT Time Calculation (min) 44 min    Activity Tolerance Patient tolerated treatment well    Behavior During Therapy [facility] for tasks assessed/performed              Past Medical History:  Diagnosis Date   Allergy    Back pain    back pain began 05/2022   Medical history non-contributory    Morbid obesity with BMI of 50.0-59.9, adult [facility])    Past Surgical History:  Procedure Laterality Date   CESAREAN SECTION     CESAREAN SECTION N/A 08/16/2015   Procedure: CESAREAN SECTION;  Surgeon: Jonnie Kind, MD;  Location: [facility];  Service: Obstetrics;  Laterality: N/A;   IUD REMOVAL N/A 11/28/2013   Procedure: INTRAUTERINE DEVICE (IUD) REMOVAL;  Surgeon: Emily Filbert, MD;  Location: [facility];  Service: Gynecology;  Laterality: N/A;   TOOTH EXTRACTION  2014   WISDOM TOOTH EXTRACTION     Patient Active Problem List   Diagnosis Date Noted   Left hip pain 07/04/2022   Elevated BP without diagnosis of hypertension 07/01/2021   Otitis externa of right ear 06/21/2021   Hemorrhagic ovarian cyst 03/28/2021   Encounter to establish care 03/28/2021   Hidradenitis suppurativa 03/28/2021   Anxiety and depression 03/28/2021   Depression 07/23/2017   Women's annual routine gynecological examination 07/23/2017   Smoker 02/28/2016    PCP: Claudine Mouton Early NP  REFERRING PROVIDER: Emeterio Reeve Early NP   REFERRING DIAG:  [phone] (ICD-10-CM) - Left hip pain  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  M51.36 (ICD-10-CM) - DDD (degenerative disc disease), lumbar  M47.26 (ICD-10-CM) - Osteoarthritis of spine with radiculopathy, lumbar region    Rationale  for Evaluation and Treatment: Rehabilitation  THERAPY DIAG:  Other low back pain  Muscle spasm of back  ONSET DATE: Chronic pain but pain increased in September  SUBJECTIVE:                                                                                                                                                                                           SUBJECTIVE STATEMENT: Feels like a knot in my left buttock radiating to my knee, my lb and hip have been popping  the last few days"  Patient has a multiyear history of episodic low back pain.  The pain originates in her back but radiates down into her left hip can have increased pain sitting for period of time or with extended time walking.  Of note she also has a ovarian cyst. She is on a muscle relaxer. She is having difficulty sitting up for any period of time now. She was feeling like when she lies down she   PERTINENT HISTORY:  Low back pain;  Morbid obesity  PAIN:  Are you having pain? Yes: NPRS scale: 6/10 Pain location: Left lumbar spine Pain description: Aching Aggravating factors: sitting and walking  Relieving factors: rest   PRECAUTIONS: None  WEIGHT BEARING RESTRICTIONS: No  FALLS:  Has patient fallen in last 6 months? No  LIVING ENVIRONMENT: 3 steps into the house  OCCUPATION:  Work as an Database administrator   Hobbies: Associate Professor ; walking      PLOF: Independent  PATIENT GOALS:  Do get back to living her life   NEXT MD VISIT:   OBJECTIVE:   DIAGNOSTIC FINDINGS:  Low back X-ray: IMPRESSION: Mild degenerative disc and joint disease at L5-S1 with mild neuroforaminal stenosis at this level. PATIENT SURVEYS:  FOTO    SCREENING FOR RED FLAGS: Bowel or bladder incontinence: No Spinal tumors: No Cauda equina syndrome: No Compression fracture: No Abdominal aneurysm: No  COGNITION: Overall cognitive status: Within functional limits for tasks assessed     SENSATION: Pain can  radiate down the back of the leg  MUSCLE LENGTH:  POSTURE: No Significant postural limitations  PALPATION: Significant spasming in left upper gluteal and into the left lower lumbar spine  LUMBAR ROM:   AROM eval  Flexion Full flexion but painful coming back from flexion  Extension No pain  Right lateral flexion No pain  Left lateral flexion No pain  Right rotation Painful on the left side limited 50%  Left rotation Limited 25% but no pain   (Blank rows = not tested)  LOWER EXTREMITY ROM:     Passive  Right eval Left eval  Hip flexion full Painful past 80 degrees  Hip extension    Hip abduction Full    Hip adduction    Hip internal rotation Full Painful past 5 degrees  Hip external rotation Full No pain  Knee flexion    Knee extension    Ankle dorsiflexion    Ankle plantarflexion    Ankle inversion    Ankle eversion     (Blank rows = not tested)  LOWER EXTREMITY MMT:    MMT Right eval Left eval  Hip flexion 35 25.1  Hip extension    Hip abduction 37.9 41.2  Hip adduction    Hip internal rotation    Hip external rotation    Knee flexion    Knee extension 46.4 45.4  Ankle dorsiflexion    Ankle plantarflexion    Ankle inversion    Ankle eversion     (Blank rows = not tested)   GAIT: Mild lateral motion noted with ambulation TODAY'S TREATMENT:  DATE:  11/21 Pt seen for aquatic therapy today.  Treatment took place in water 3.25-4.5 ft in depth at the [facility] pool. Temp of water was 92d.  Pt entered/exited the pool via stairs with handrails rail.  Intro to setting L stretch at wall Hamstring stretch on 3rd step. doesn't tolerate rle df in position causes left lb/buttock discomfort; gastroc stretch bottom step Figure 4 stretch holding to hand rails. Cycling on noodle with yellow hand buoys ("Most comfortable I have been  in 2 months"): add/abd and skiing (some discomfort LB with skiing).  -clam shell x 10 Standing holding to wall:df; hip circles cw & ccw *hanging on noodle suspended under arms x 5 mins for LB distraction  Pt requires the buoyancy and hydrostatic pressure of water for support, and to offload joints by unweighting joint load by at least 50 % in navel deep water and by at least 75-80% in chest to neck deep water.  Viscosity of the water is needed for resistance of strengthening. Water current perturbations provides challenge to standing balance requiring increased core activation.     PATIENT EDUCATION:  Education details: HEP, symptom management, anatomy of condition, benefits of prone positioning, Person educated: Patient Education method: Explanation, Demonstration, Tactile cues, Verbal cues, and Handouts Education comprehension: verbalized understanding, returned demonstration, verbal cues required, tactile cues required, and needs further education  HOME EXERCISE PROGRAM: Lying on your stomach prop up on the elbows 4-5 minutes if able  Ball stretches at the table 20-30 sec hold   Tennis ball   ASSESSMENT:  CLINICAL IMPRESSION: Pt is safe and indep in setting with therapist instructing from deck. Began with walking then gentle stretching. Pt tolerates stretching at best fair with increased pain sensitivity through mid LB. Cycling on noodle decreases pain almost instantaneously.  Pt begins all activities with a big effort, requires cues to decrease as she causes discomfort.  With less effort and decreased ROM or stretch she gains desired movement and effect. She does have 2 episodes of reported popping of LB throughout session.  She tolerates overall session fair. She reports compliance with HEP.  Patient is a 36 year old female who presents to physical therapy with onset of lower back.  Lower back pain has been chronic and episodic in the past but recently has become more consistent since  September 2023.  She presents with hip flexor weakness on the left side, significant spasming in the left gluteal, limited hip passive range of motion into flexion and internal rotation on the left.  She would benefit from skilled therapy including aquatic therapy to improve pain and function in order to allow her to perform community activity.  Patient responded well to prone positioning.  She sleeps on her stomach at night.  OBJECTIVE IMPAIRMENTS: Abnormal gait, decreased activity tolerance, decreased endurance, difficulty walking, decreased ROM, decreased strength, increased fascial restrictions, obesity, and pain.   ACTIVITY LIMITATIONS: carrying, lifting, bending, sitting, standing, stairs, transfers, and locomotion level  PARTICIPATION LIMITATIONS: meal prep, cleaning, laundry, driving, shopping, occupation, and yard work  PERSONAL FACTORS: 1-2 comorbidities: morbid obesity   are also affecting patient's functional outcome.   REHAB POTENTIAL: Good  CLINICAL DECISION MAKING: Evolving/moderate complexity because of the loss of mobility over this in September  EVALUATION COMPLEXITY: Moderate   GOALS: Goals reviewed with patient? Yes  SHORT TERM GOALS: Target date: 09/23/2022  Patient will increase left hip flexion to 100 degrees Baseline: Goal status: INITIAL  2.  Patient will increase right lumbar rotation by 25%  Baseline:  Goal status: INITIAL  3.  Patient will increase left hip flexor strength by 7 pounds Baseline:  Goal status: INITIAL    LONG TERM GOALS: Target date: 10/14/2022  Patient will sit at work for greater than 1 hour without increased low back pain Baseline:  Goal status: INITIAL  2.  Patient will walk 1000 feet without increased low back pain Baseline:  Goal status: INITIAL  3.  Patient will have full aquatics and land based exercise program Baseline:  Goal status: INITIAL    PLAN:  PT FREQUENCY: 1-2x/week  PT DURATION: 6 weeks  PLANNED  INTERVENTIONS: Therapeutic exercises, Therapeutic activity, Neuromuscular re-education, Balance training, Gait training, Patient/Family education, Self Care, Joint mobilization, Stair training, DME instructions, Aquatic Therapy, Electrical stimulation, Cryotherapy, Moist heat, Ultrasound, and Manual therapy.  PLAN FOR NEXT SESSION: Begin with manual therapy to the left gluteal if tolerated, review HEP, give MedBridge HEP's MedBridge was not available, consider LAD to left lower extremity but patient may have some difficulty because she has difficulty lying supine review prone positioning and if it helped, consider dry needling no education given so, far in the pool work on hip and core strengthening   Denton Meek, PT MPT  09/02/2022, 3:41 PM

## 2022-09-08 ENCOUNTER — Telehealth: Payer: 59 | Admitting: Physician Assistant

## 2022-09-08 DIAGNOSIS — M5440 Lumbago with sciatica, unspecified side: Secondary | ICD-10-CM

## 2022-09-08 NOTE — Progress Notes (Signed)
Because you are reporting increased pain with associated numbness to your leg, I feel your condition warrants further evaluation and I recommend that you be seen in a face to face visit. You need to have a physical exam completed to ensure there are no neurologic deficits.   NOTE: There will be NO CHARGE for this eVisit   If you are having a true medical emergency please call 911.      For an urgent face to face visit, [place] has seven urgent care centers for your convenience:     [facility] Health Urgent Care Center at [facility] Directions [phone] [address] [place], [place] [address]    [facility] Health Urgent Care Center [facility]) Get Driving Directions [phone] [address] [place], [place] [address]  [facility] Health Urgent Care Center [facility]) Get Driving Directions [phone] [address] [place],  [place]  [address]  [facility] Health Urgent Care Center [facility] - at [facility] Directions  [phone] [address] [place],  [place] [address]   [facility] Health Urgent Care at [facility] Get Driving Directions [phone] [address] [place], [place] [address]   [facility] Health Urgent Care at [facility] Get Driving Directions  [phone] [address] [place], [place] [address]   [facility] Health Urgent Care at [facility] [place] Directions [phone] [address] [place], [place] [address]  Your MyChart E-visit questionnaire answers were reviewed by a board certified advanced clinical practitioner to complete your personal care plan based on your specific symptoms.  Thank you for using e-Visits.    Approximately 5 minutes was spent documenting and reviewing patient's chart.

## 2022-09-10 ENCOUNTER — Encounter (HOSPITAL_BASED_OUTPATIENT_CLINIC_OR_DEPARTMENT_OTHER): Payer: Self-pay | Admitting: Physical Therapy

## 2022-09-13 ENCOUNTER — Other Ambulatory Visit: Payer: Self-pay

## 2022-09-13 ENCOUNTER — Encounter (HOSPITAL_COMMUNITY): Payer: Self-pay

## 2022-09-13 ENCOUNTER — Emergency Department (HOSPITAL_COMMUNITY)
Admission: EM | Admit: 2022-09-13 | Discharge: 2022-09-13 | Disposition: A | Discharge status: Home / Self Care | Payer: 59 | Attending: Emergency Medicine | Admitting: Emergency Medicine

## 2022-09-13 DIAGNOSIS — M545 Low back pain, unspecified: Secondary | ICD-10-CM | POA: Diagnosis present

## 2022-09-13 DIAGNOSIS — M5416 Radiculopathy, lumbar region: Secondary | ICD-10-CM | POA: Diagnosis not present

## 2022-09-13 DIAGNOSIS — M5442 Lumbago with sciatica, left side: Secondary | ICD-10-CM | POA: Insufficient documentation

## 2022-09-13 MED ORDER — ONDANSETRON 4 MG PO TBDP
4.0000 mg | ORAL_TABLET | Freq: Once | ORAL | Status: AC
  Administered 2022-09-13: 4 mg via ORAL
  Filled 2022-09-13: qty 1

## 2022-09-13 MED ORDER — LIDOCAINE 5 % EX PTCH
1.0000 | MEDICATED_PATCH | CUTANEOUS | Status: DC
  Administered 2022-09-13: 1 via TRANSDERMAL
  Filled 2022-09-13: qty 1

## 2022-09-13 MED ORDER — METHOCARBAMOL 500 MG PO TABS: 500.0000 mg | ORAL_TABLET | Freq: Two times a day (BID) | ORAL | 0 refills | Status: DC

## 2022-09-13 MED ORDER — HYDROCODONE-ACETAMINOPHEN 5-325 MG PO TABS: 1.0000 | ORAL_TABLET | ORAL | 0 refills | Status: DC | PRN

## 2022-09-13 MED ORDER — METHYLPREDNISOLONE ACETATE 80 MG/ML IJ SUSP
40.0000 mg | Freq: Once | INTRAMUSCULAR | Status: AC
  Administered 2022-09-13: 40 mg via INTRAMUSCULAR
  Filled 2022-09-13: qty 1

## 2022-09-13 MED ORDER — METHOCARBAMOL 500 MG PO TABS
750.0000 mg | ORAL_TABLET | Freq: Once | ORAL | Status: AC
  Administered 2022-09-13: 750 mg via ORAL
  Filled 2022-09-13: qty 2

## 2022-09-13 MED ORDER — DICLOFENAC SODIUM 50 MG PO TBEC: 50.0000 mg | DELAYED_RELEASE_TABLET | Freq: Two times a day (BID) | ORAL | 0 refills | Status: DC

## 2022-09-13 MED ORDER — HYDROMORPHONE HCL 1 MG/ML IJ SOLN
1.0000 mg | Freq: Once | INTRAMUSCULAR | Status: AC
  Administered 2022-09-13: 1 mg via INTRAMUSCULAR
  Filled 2022-09-13: qty 1

## 2022-09-13 NOTE — Discharge Instructions (Signed)
Lidocaine patch to back as needed as directed.  These are available over-the-counter.  Discontinue your meloxicam.   Take Robaxin twice daily as prescribed. Take diclofenac twice daily as prescribed.  Limited use of Norco as needed as prescribed for severe pain.  Can apply warm compresses to back for 20 minutes at a time.  Recommend light activity as tolerated. Follow-up with your physical therapist.  See orthopedics as previously scheduled.

## 2022-09-13 NOTE — ED Triage Notes (Signed)
Pt arrived POV from home c/o lower back pain that radiates down her leg. Pt states she was just recently diagnosed with bulging disk and is seeing PT but today woke up with worse pain.

## 2022-09-13 NOTE — ED Provider Notes (Signed)
[facility] EMERGENCY DEPARTMENT Provider Note   CSN: [id number] Arrival date & time: 09/13/22  0757     History  Chief Complaint  Patient presents with   Back Pain    Betty Shelton is a 36 y.o. female.  36 year old female presents with concern for pain in her low back which radiates down her left leg.  Reports onset of pain in her lower back starting in August of this year without fall or injury.  Patient went on a cruise in September, was having pain at that time and took 20 tablets of ibuprofen over the course of the 3-day cruise which is unusual for her to require some pain medication.  Had a prolonged wait time on her flight and when she returned home, required a wheelchair to be able to get off the plane and to her ride home.  Patient noted to the driver that she was having pain in her left lower back and felt like this might be SI related.  Patient went to sports management at that time, had an injection in her left SI without any improvement in her pain.  Patient followed up with her primary care provider who has referred her to physical therapy and orthopedics.  Patient has completed 2 sessions of physical therapy, had improvement during her aquatherapy session on 09/02/22, and is now having worsening of her pain, with popping in her hips and back.  She feels like she has numbness in her left leg down to her toes.  Pain is significantly worse with bearing weight.  Had a massage/stretching session yesterday prior to the event last evening which resulted in significant pain and try to get to the bathroom 5 steps away today and was unable to get off the commode without the assistance of her spouse.  This prompted her visit to the emergency room.  States yesterday she went to an event, was sitting for a while at this event, got home early this morning, took her meloxicam and went to bed.  Patient does not take any other medications. History of prior bulging disc. XR earlier  with DDD L5-S1. Denies saddle paresthesias, loss of bowel or bladder control, abdominal pain, fever, trauma, history of IVDA.       Home Medications Prior to Admission medications   Medication Sig Start Date End Date Taking? Authorizing Provider  diclofenac (VOLTAREN) 50 MG EC tablet Take 1 tablet (50 mg total) by mouth 2 (two) times daily for 10 days. 09/13/22 09/23/22 Yes Jeannie Fend, PA-C  HYDROcodone-acetaminophen (NORCO/VICODIN) 5-325 MG tablet Take 1 tablet by mouth every 4 (four) hours as needed. 09/13/22  Yes Jeannie Fend, PA-C  methocarbamol (ROBAXIN) 500 MG tablet Take 1 tablet (500 mg total) by mouth 2 (two) times daily. 09/13/22  Yes Jeannie Fend, PA-C      Allergies    Mucinex d [pseudoephedrine-guaifenesin], Guaifenesin, and Mucinex [guaifenesin er]    Review of Systems   Review of Systems Negative except as per HPI Physical Exam Updated Vital Signs BP 120/78 (BP Location: Right Arm)   Pulse 90   Temp 98 F (36.7 C) (Oral)   Resp 16   Ht 5\' 5"  (1.651 m)   Wt (!) 149.7 kg   LMP 08/09/2022 (Exact Date)   SpO2 98%   BMI 54.91 kg/m  Physical Exam Vitals and nursing note reviewed.  Constitutional:      General: She is not in acute distress.    Appearance: She is  well-developed. She is obese. She is not diaphoretic.  HENT:     Head: Normocephalic and atraumatic.  Cardiovascular:     Pulses: Normal pulses.  Pulmonary:     Effort: Pulmonary effort is normal.  Abdominal:     Palpations: Abdomen is soft.     Tenderness: There is no abdominal tenderness.  Musculoskeletal:        General: No swelling, deformity or signs of injury.       Back:     Right lower leg: No edema.     Left lower leg: No edema.  Skin:    General: Skin is warm and dry.     Findings: No erythema or rash.  Neurological:     Mental Status: She is alert and oriented to person, place, and time.     Sensory: No sensory deficit.     Motor: No weakness.     Deep Tendon Reflexes:  Reflexes normal. Babinski sign absent on the right side. Babinski sign absent on the left side.     Reflex Scores:      Patellar reflexes are 1+ on the left side.      Achilles reflexes are 1+ on the right side and 1+ on the left side.    Comments: Great toe strength intact, no clonus, reflexes symmetric. Pain left left with left straight leg raise. Sensation intact throughout bilateral lower extremities   Psychiatric:        Behavior: Behavior normal.     ED Results / Procedures / Treatments   Labs (all labs ordered are listed, but only abnormal results are displayed) Labs Reviewed - No data to display  EKG None  Radiology No results found.  Procedures Procedures    Medications Ordered in ED Medications  lidocaine (LIDODERM) 5 % 1 patch (1 patch Transdermal Patch Applied 09/13/22 1019)  HYDROmorphone (DILAUDID) injection 1 mg (1 mg Intramuscular Given 09/13/22 1017)  ondansetron (ZOFRAN-ODT) disintegrating tablet 4 mg (4 mg Oral Given 09/13/22 1017)  methocarbamol (ROBAXIN) tablet 750 mg (750 mg Oral Given 09/13/22 1016)  methylPREDNISolone acetate (DEPO-MEDROL) injection 40 mg (40 mg Intramuscular Given 09/13/22 1017)    ED Course/ Medical Decision Making/ A&P                           Medical Decision Making Risk Prescription drug management.   This patient presents to the ED for concern of back pain, this involves an extensive number of treatment options, and is a complaint that carries with it a high risk of complications and morbidity.  The differential diagnosis includes but not limited to muscle spasm, stenosis, DDD, sciatica   Co morbidities that complicate the patient evaluation  Acute on chronic back pain, morbid obesity   Additional history obtained:  Additional history obtained from husband at bedside who assist with history as above External records from outside source obtained and reviewed including x-ray obtained 08/08/2022 showing mild degenerative  disc and joint disease at L5-S1 with mild neuroforaminal stenosis at this level PT note dated 09/02/2022 Call in note noted 09/08/2022   Consultations Obtained:  I requested consultation with the ER attending, Dr. Rubin Payor,  and discussed lab and imaging findings as well as pertinent plan - they recommend: Agrees with plan of care   Problem List / ED Course / Critical interventions / Medication management  36 year old female presents with complaint of left lower back pain radiating down left leg with complaint of numbness  in the left leg.  Details as above.  Found to have equal strength, reflexes, sensation intact, DP pulses present.  Abdomen soft and nontender. Patient was provided with below pain medications.  Has had slight improvement with her pain and feels like she is at the point where she could go home to try and sleep more comfortably in her own bed than in the hall bed where she currently is.  Patient is understandably frustrated with her pain and lack of improvement over the past few months.  Patient is currently only taking meloxicam.  I discussed with patient this is an NSAID, I feel she would benefit from a muscle relaxant.  As the meloxicam is not working her significant relief, we will have her discontinue this and switch to diclofenac.  We will add Robaxin.  Is provided with IM Depo-Medrol in the ER.  Can continue with topical lidocaine patches.  Provided with short course of narcotic pain medication.  Patient does not ask for this medication and does not wish to be on narcotics however feel that she needs some form of additional pain relief until she is able to get the further care she needs through PT and specialty follow up.  I ordered medication including Dilaudid, Zofran, Robaxin, Depo-Medrol, Lidoderm for pain Reevaluation of the patient after these medicines showed that the patient improved I have reviewed the patients home medicines and have made adjustments as  needed   Social Determinants of Health:  Lives with spouse, in PT with scheduled follow-up with Ortho.   Test / Admission - Considered:  Consider repeat imaging however x-rays recently obtained, no falls or injuries since that time.  No red flag symptoms to warrant MRI imaging at this time.         Final Clinical Impression(s) / ED Diagnoses Final diagnoses:  Acute left-sided low back pain with left-sided sciatica  Lumbar radiculopathy    Rx / DC Orders ED Discharge Orders          Ordered    methocarbamol (ROBAXIN) 500 MG tablet  2 times daily        09/13/22 1118    diclofenac (VOLTAREN) 50 MG EC tablet  2 times daily        09/13/22 1118    HYDROcodone-acetaminophen (NORCO/VICODIN) 5-325 MG tablet  Every 4 hours PRN        09/13/22 1119              Alden Hipp 09/13/22 1440    Benjiman Core, MD 09/14/22 1521

## 2022-09-15 ENCOUNTER — Telehealth: Payer: Self-pay | Admitting: Orthopaedic Surgery

## 2022-09-15 NOTE — Telephone Encounter (Signed)
Patient states she is in sever pain and that sh needs an MRI. Please advise..[phone]

## 2022-09-15 NOTE — Telephone Encounter (Signed)
Pt seen 07/10/2022 for Sacroillitis, Injection with Dr. Alvester Morin already completed.  Please advise if okay to order MRI

## 2022-09-16 ENCOUNTER — Encounter (HOSPITAL_BASED_OUTPATIENT_CLINIC_OR_DEPARTMENT_OTHER): Payer: Self-pay | Admitting: Orthopaedic Surgery

## 2022-09-19 ENCOUNTER — Encounter (HOSPITAL_BASED_OUTPATIENT_CLINIC_OR_DEPARTMENT_OTHER): Payer: Self-pay

## 2022-09-19 ENCOUNTER — Ambulatory Visit (INDEPENDENT_AMBULATORY_CARE_PROVIDER_SITE_OTHER): Payer: 59 | Admitting: Radiology

## 2022-09-19 ENCOUNTER — Encounter (HOSPITAL_BASED_OUTPATIENT_CLINIC_OR_DEPARTMENT_OTHER): Payer: 59 | Admitting: Physical Therapy

## 2022-09-19 VITALS — BP 126/88

## 2022-09-19 DIAGNOSIS — A599 Trichomoniasis, unspecified: Secondary | ICD-10-CM | POA: Diagnosis not present

## 2022-09-19 NOTE — Progress Notes (Signed)
      Subjective: Betty Shelton is a 36 y.o. female here for TOC trich. Took all meds as prescribed. Partner tested negative, was only treated with rocephin. Requested a wheelchair stating she could not walk from the lobby to the exam room.   Review of Systems  All other systems reviewed and are negative.   Past Medical History:  Diagnosis Date   Allergy    Back pain    back pain began 05/2022   Medical history non-contributory    Morbid obesity with BMI of 50.0-59.9, adult (HCC)       Objective:  Today's Vitals   09/19/22 1526  BP: 126/88   There is no height or weight on file to calculate BMI.   -General: no acute distress -Vulva: without lesions or discharge -Vagina: discharge present, sureswab obtained -Cervix: no lesion or discharge, no CMT -Perineum: no lesions -Uterus: Mobile, non tender -Adnexa: no masses or tenderness      Chaperone offered and declined.  Assessment:/Plan:   1. Trichomoniasis  - SURESWAB CT/NG/T. vaginalis    Will contact patient with results of testing completed today. Avoid intercourse until symptoms are resolved. Safe sex encouraged. Avoid the use of soaps or perfumed products in the peri area. Avoid tub baths and sitting in sweaty or wet clothing for prolonged periods of time.

## 2022-09-20 LAB — SURESWAB CT/NG/T. VAGINALIS
C. trachomatis RNA, TMA: NOT DETECTED
N. gonorrhoeae RNA, TMA: NOT DETECTED
Trichomonas vaginalis RNA: NOT DETECTED

## 2022-09-22 ENCOUNTER — Ambulatory Visit (INDEPENDENT_AMBULATORY_CARE_PROVIDER_SITE_OTHER): Payer: 59 | Admitting: Orthopaedic Surgery

## 2022-09-22 ENCOUNTER — Other Ambulatory Visit (HOSPITAL_BASED_OUTPATIENT_CLINIC_OR_DEPARTMENT_OTHER): Payer: Self-pay

## 2022-09-22 DIAGNOSIS — M5432 Sciatica, left side: Secondary | ICD-10-CM

## 2022-09-22 MED ORDER — METHYLPREDNISOLONE 4 MG PO TBPK
ORAL_TABLET | ORAL | 0 refills | Status: DC
  Filled 2022-09-22: qty 21, 6d supply, fill #0

## 2022-09-22 NOTE — Progress Notes (Signed)
Chief Complaint: Left SI joint pain     History of Present Illness:   09/22/2022: Presents today for follow-up of her lower back pain.  She states that she did not get any significant relief from her SI injection.  At today's visit she is not having radiating pain down the left leg.  Her foot and ankle is now numb.  She is here today for further assessment.  Betty Shelton is a 36 y.o. female presents today for follow-up of her left-sided lower back pain.  She states that she did have this prior to her recent cruise although this worsened this significantly.  She has been on prednisone and Mobic for this which is helped somewhat but not completely taken away.  She has gone to a chiropractor as well but this has not completely resolved her pain.  She is having difficulty lifting her children.  She did walk on sand in the recent the strip which she believes the pain is worse after that    Surgical History:   none  PMH/PSH/Family History/Social History/Meds/Allergies:    Past Medical History:  Diagnosis Date   Allergy    Back pain    back pain began 05/2022   Medical history non-contributory    Morbid obesity with BMI of 50.0-59.9, adult [facility])    Past Surgical History:  Procedure Laterality Date   CESAREAN SECTION     CESAREAN SECTION N/A 08/16/2015   Procedure: CESAREAN SECTION;  Surgeon: Jonnie Kind, MD;  Location: [facility];  Service: Obstetrics;  Laterality: N/A;   IUD REMOVAL N/A 11/28/2013   Procedure: INTRAUTERINE DEVICE (IUD) REMOVAL;  Surgeon: Emily Filbert, MD;  Location: [facility];  Service: Gynecology;  Laterality: N/A;   TOOTH EXTRACTION  2014   WISDOM TOOTH EXTRACTION     Social History   Socioeconomic History   Marital status: Single    Spouse name: Not on file   Number of children: Not on file   Years of education: Not on file   Highest education level: Not on file  Occupational History   Not on file  Tobacco Use   Smoking  status: Every Day    Packs/day: 0.25    Years: 8.00    Total pack years: 2.00    Types: Cigarettes    Passive exposure: Never   Smokeless tobacco: Never  Vaping Use   Vaping Use: Never used  Substance and Sexual Activity   Alcohol use: Yes    Comment: socially   Drug use: No   Sexual activity: Yes    Partners: Male, Female    Birth control/protection: Condom, Rhythm  Other Topics Concern   Not on file  Social History Narrative   Not on file   Social Determinants of Health   Financial Resource Strain: Not on file  Food Insecurity: Not on file  Transportation Needs: Not on file  Physical Activity: Not on file  Stress: Not on file  Social Connections: Not on file   Family History  Problem Relation Age of Onset   Diabetes Mother    Arthritis Mother        back pain, spinal fusions   Cancer Maternal Aunt        breast   Diabetes Maternal Grandmother    Hypertension Maternal Grandmother  Allergies  Allergen Reactions   Mucinex D [Pseudoephedrine-Guaifenesin] Hives   Guaifenesin Hives   Mucinex [Guaifenesin Er] Hives and Swelling   Current Outpatient Medications  Medication Sig Dispense Refill   methylPREDNISolone (MEDROL DOSEPAK) 4 MG TBPK tablet Take per packet instructions 21 tablet 0   HYDROcodone-acetaminophen (NORCO/VICODIN) 5-325 MG tablet Take 1 tablet by mouth every 4 (four) hours as needed. 10 tablet 0   methocarbamol (ROBAXIN) 500 MG tablet Take 1 tablet (500 mg total) by mouth 2 (two) times daily. 20 tablet 0   No current facility-administered medications for this visit.   No results found.  Review of Systems:   A ROS was performed including pertinent positives and negatives as documented in the HPI.  Physical Exam :   Constitutional: NAD and appears stated age Neurological: Alert and oriented Psych: Appropriate affect and cooperative Last menstrual period 09/04/2022.   Comprehensive Musculoskeletal Exam:    TTP left SI joint, there is no pain  radiating down the side.  No pain in the lower lumbar spine.  Progressive exam is distant neurovascularly intact  Positive straight leg test on the left with radiating pain  Imaging:    I personally reviewed and interpreted the radiographs.   Assessment:   36 y.o. female with left lower back pain consistent with lumbar radiculopathy.  At this time she has failed several months of aquatic therapy and physical therapy for her back.  She has had 1 injection in the SI joint without any relief.  Plan :    -At this time we will plan for an MRI of her lumbar spine     I personally saw and evaluated the patient, and participated in the management and treatment plan.  Huel Cote, MD Attending Physician, Orthopedic Surgery  This document was dictated using Dragon voice recognition software. A reasonable attempt at proof reading has been made to minimize errors.

## 2022-09-26 ENCOUNTER — Ambulatory Visit (HOSPITAL_BASED_OUTPATIENT_CLINIC_OR_DEPARTMENT_OTHER): Payer: 59 | Admitting: Physical Therapy

## 2022-10-01 ENCOUNTER — Ambulatory Visit
Admission: RE | Admit: 2022-10-01 | Discharge: 2022-10-01 | Disposition: A | Discharge status: Home / Self Care | Payer: 59 | Source: Ambulatory Visit | Attending: Orthopaedic Surgery | Admitting: Orthopaedic Surgery

## 2022-10-01 DIAGNOSIS — M5432 Sciatica, left side: Secondary | ICD-10-CM

## 2022-10-02 ENCOUNTER — Encounter (HOSPITAL_BASED_OUTPATIENT_CLINIC_OR_DEPARTMENT_OTHER): Payer: Self-pay | Admitting: Orthopaedic Surgery

## 2022-10-03 ENCOUNTER — Ambulatory Visit (INDEPENDENT_AMBULATORY_CARE_PROVIDER_SITE_OTHER): Payer: 59 | Admitting: Orthopaedic Surgery

## 2022-10-03 ENCOUNTER — Encounter (HOSPITAL_BASED_OUTPATIENT_CLINIC_OR_DEPARTMENT_OTHER): Payer: 59 | Admitting: Physical Therapy

## 2022-10-03 DIAGNOSIS — M5136 Other intervertebral disc degeneration, lumbar region: Secondary | ICD-10-CM

## 2022-10-03 MED ORDER — TRAMADOL HCL 50 MG PO TABS: 50.0000 mg | ORAL_TABLET | Freq: Four times a day (QID) | ORAL | 2 refills | Status: DC | PRN

## 2022-10-03 NOTE — Progress Notes (Signed)
Chief Complaint: Left SI joint pain     History of Present Illness:   10/03/2022: Presents today for follow-up of her lower back pain.  He is here today for MRI discussion.  Betty Shelton is a 36 y.o. female presents today for follow-up of her left-sided lower back pain.  She states that she did have this prior to her recent cruise although this worsened this significantly.  She has been on prednisone and Mobic for this which is helped somewhat but not completely taken away.  She has gone to a chiropractor as well but this has not completely resolved her pain.  She is having difficulty lifting her children.  She did walk on sand in the recent the strip which she believes the pain is worse after that    Surgical History:   none  PMH/PSH/Family History/Social History/Meds/Allergies:    Past Medical History:  Diagnosis Date   Allergy    Back pain    back pain began 05/2022   Medical history non-contributory    Morbid obesity with BMI of 50.0-59.9, adult [facility])    Past Surgical History:  Procedure Laterality Date   CESAREAN SECTION     CESAREAN SECTION N/A 08/16/2015   Procedure: CESAREAN SECTION;  Surgeon: Tilda Burrow, MD;  Location: [facility];  Service: Obstetrics;  Laterality: N/A;   IUD REMOVAL N/A 11/28/2013   Procedure: INTRAUTERINE DEVICE (IUD) REMOVAL;  Surgeon: Allie Bossier, MD;  Location: [facility];  Service: Gynecology;  Laterality: N/A;   TOOTH EXTRACTION  2014   WISDOM TOOTH EXTRACTION     Social History   Socioeconomic History   Marital status: Single    Spouse name: Not on file   Number of children: Not on file   Years of education: Not on file   Highest education level: Not on file  Occupational History   Not on file  Tobacco Use   Smoking status: Every Day    Packs/day: 0.25    Years: 8.00    Total pack years: 2.00    Types: Cigarettes    Passive exposure: Never   Smokeless tobacco: Never  Vaping Use   Vaping Use:  Never used  Substance and Sexual Activity   Alcohol use: Yes    Comment: socially   Drug use: No   Sexual activity: Yes    Partners: Male, Female    Birth control/protection: Condom, Rhythm  Other Topics Concern   Not on file  Social History Narrative   Not on file   Social Determinants of Health   Financial Resource Strain: Not on file  Food Insecurity: Not on file  Transportation Needs: Not on file  Physical Activity: Not on file  Stress: Not on file  Social Connections: Not on file   Family History  Problem Relation Age of Onset   Diabetes Mother    Arthritis Mother        back pain, spinal fusions   Cancer Maternal Aunt        breast   Diabetes Maternal Grandmother    Hypertension Maternal Grandmother    Allergies  Allergen Reactions   Mucinex D [Pseudoephedrine-Guaifenesin] Hives   Guaifenesin Hives   Mucinex [Guaifenesin Er] Hives and Swelling   Current Outpatient Medications  Medication Sig Dispense Refill   HYDROcodone-acetaminophen (NORCO/VICODIN)  5-325 MG tablet Take 1 tablet by mouth every 4 (four) hours as needed. 10 tablet 0   methocarbamol (ROBAXIN) 500 MG tablet Take 1 tablet (500 mg total) by mouth 2 (two) times daily. 20 tablet 0   methylPREDNISolone (MEDROL DOSEPAK) 4 MG TBPK tablet Take per packet instructions 21 tablet 0   No current facility-administered medications for this visit.   No results found.  Review of Systems:   A ROS was performed including pertinent positives and negatives as documented in the HPI.  Physical Exam :   Constitutional: NAD and appears stated age Neurological: Alert and oriented Psych: Appropriate affect and cooperative Last menstrual period 09/04/2022.   Comprehensive Musculoskeletal Exam:    TTP left SI joint, there is no pain radiating down the side.  No pain in the lower lumbar spine.  Progressive exam is distant neurovascularly intact  Positive straight leg test on the left with radiating pain  Imaging:     MRI lumbar spine: There is a L4-L5 disc herniation that is worse on the left compared to the right  I personally reviewed and interpreted the radiographs.   Assessment:   36 y.o. female with left lower back pain consistent with lumbar radiculopathy.  At this time she does have a neurosurgical appointment scheduled for discussion of this with Dr. Ronnald Ramp 11 January.  I do believe that this would be a good next step to see if she is a surgical candidate.  I would also like to plan to refer her to Dr. Ernestina Patches for discussion of L4-L5 left-sided transforaminal injection to hopefully get her some relief.  I will plan to see him back as needed.  Plan :    -Return to clinic as needed     I personally saw and evaluated the patient, and participated in the management and treatment plan.  Vanetta Mulders, MD Attending Physician, Orthopedic Surgery  This document was dictated using Dragon voice recognition software. A reasonable attempt at proof reading has been made to minimize errors.

## 2022-10-04 ENCOUNTER — Encounter (HOSPITAL_BASED_OUTPATIENT_CLINIC_OR_DEPARTMENT_OTHER): Payer: Self-pay | Admitting: Emergency Medicine

## 2022-10-04 ENCOUNTER — Emergency Department (HOSPITAL_BASED_OUTPATIENT_CLINIC_OR_DEPARTMENT_OTHER)
Admission: EM | Admit: 2022-10-04 | Discharge: 2022-10-04 | Disposition: A | Discharge status: Home / Self Care | Payer: 59 | Attending: Emergency Medicine | Admitting: Emergency Medicine

## 2022-10-04 DIAGNOSIS — G8929 Other chronic pain: Secondary | ICD-10-CM | POA: Insufficient documentation

## 2022-10-04 DIAGNOSIS — M5416 Radiculopathy, lumbar region: Secondary | ICD-10-CM

## 2022-10-04 DIAGNOSIS — M549 Dorsalgia, unspecified: Secondary | ICD-10-CM | POA: Diagnosis present

## 2022-10-04 MED ORDER — METHOCARBAMOL 500 MG PO TABS: 500.0000 mg | ORAL_TABLET | Freq: Two times a day (BID) | ORAL | 3 refills | Status: DC

## 2022-10-04 MED ORDER — METHYLPREDNISOLONE 4 MG PO TBPK: ORAL_TABLET | ORAL | 0 refills | Status: DC

## 2022-10-04 MED ORDER — METHOCARBAMOL 500 MG PO TABS
750.0000 mg | ORAL_TABLET | Freq: Once | ORAL | Status: AC
  Administered 2022-10-04: 750 mg via ORAL
  Filled 2022-10-04: qty 2

## 2022-10-04 NOTE — ED Triage Notes (Signed)
Took Tramadol & Robaxin 11AM

## 2022-10-04 NOTE — ED Triage Notes (Signed)
Patient presents C/O lower back pain, already followed by ortho and discussing possible surgery. Saw Dr Steward Drone yesterday who Rx Tramadol but patient is requesting Rx for muscle relaxer.

## 2022-10-04 NOTE — ED Provider Notes (Signed)
[facility] EMERGENCY DEPT Provider Note   CSN: [id number] Arrival date & time: 10/04/22  1359     History  Chief Complaint  Patient presents with   Back Pain    Betty Shelton is a 36 y.o. female.  Patient is a 36 year old female with a history of lumbar radiculopathy who currently is under the care of orthopedics and planning on getting an injection soon presenting today with persistent back pain that radiates down her left leg.  She had an MRI done within the last few weeks that showed broad based disc protrusion in the lumbar spine.  Her symptoms have not significantly changed.  She saw orthopedics yesterday and they prescribed her tramadol but she has run out of her Robaxin and feels that she needs better pain control.  She is requesting a Robaxin prescription.  Also reports the Medrol Dosepak 2 weeks ago significantly helped her pain as well and is wondering if she can have another pack of that.  She has no fever, urinary retention or incontinence or bowel incontinence.  The history is provided by the patient.  Back Pain      Home Medications Prior to Admission medications   Medication Sig Start Date End Date Taking? Authorizing Provider  methocarbamol (ROBAXIN) 500 MG tablet Take 1 tablet (500 mg total) by mouth 2 (two) times daily. 10/04/22  Yes Jacey Pelc, Alphonzo Lemmings, MD  methylPREDNISolone (MEDROL DOSEPAK) 4 MG TBPK tablet Take according to packet instructions 10/04/22  Yes Gwyneth Sprout, MD  HYDROcodone-acetaminophen (NORCO/VICODIN) 5-325 MG tablet Take 1 tablet by mouth every 4 (four) hours as needed. 09/13/22   Jeannie Fend, PA-C  traMADol (ULTRAM) 50 MG tablet Take 1 tablet (50 mg total) by mouth every 6 (six) hours as needed. 10/03/22   Huel Cote, MD      Allergies    Mucinex d [pseudoephedrine-guaifenesin], Guaifenesin, and Mucinex [guaifenesin er]    Review of Systems   Review of Systems  Musculoskeletal:  Positive for back pain.     Physical Exam Updated Vital Signs BP 106/62 (BP Location: Right Arm)   Pulse 91   Temp 98 F (36.7 C) (Oral)   Resp 17   Ht 5\' 5"  (1.651 m)   Wt (!) 149.7 kg   LMP 09/04/2022 (Approximate)   SpO2 94%   BMI 54.91 kg/m  Physical Exam Vitals and nursing note reviewed.  Constitutional:      General: She is not in acute distress.    Appearance: She is well-developed.  HENT:     Head: Normocephalic and atraumatic.  Eyes:     Pupils: Pupils are equal, round, and reactive to light.  Cardiovascular:     Rate and Rhythm: Normal rate and regular rhythm.     Heart sounds: Normal heart sounds. No murmur heard. Pulmonary:     Effort: Pulmonary effort is normal.     Breath sounds: Normal breath sounds. No wheezing or rales.  Musculoskeletal:        General: Tenderness present.     Cervical back: Normal range of motion and neck supple. No rigidity. No spinous process tenderness or muscular tenderness. Normal range of motion.     Lumbar back: Tenderness present. No swelling or deformity. Decreased range of motion.     Comments: Lower lumbar tenderness and left paralumbar tenderness  Skin:    General: Skin is warm and dry.     Findings: No rash.  Neurological:     Mental Status: She is alert and  oriented to person, place, and time. Mental status is at baseline.     Coordination: Coordination normal.     Deep Tendon Reflexes:     Reflex Scores:      Patellar reflexes are 1+ on the right side and 1+ on the left side.    Comments: Decreased sensation in the left leg but able to walk and strength intact  Psychiatric:        Mood and Affect: Mood normal.     ED Results / Procedures / Treatments   Labs (all labs ordered are listed, but only abnormal results are displayed) Labs Reviewed - No data to display  EKG None  Radiology No results found.  Procedures Procedures    Medications Ordered in ED Medications  methocarbamol (ROBAXIN) tablet 750 mg (has no administration  in time range)    ED Course/ Medical Decision Making/ A&P                           Medical Decision Making Risk Prescription drug management.   Pt with gradual onset of back pain suggestive of radiculopathy.  No neurovascular compromise and no incontinence.  Pt has no infectious sx, hx of CA  or other red flags concerning for pathologic back pain.  Pt is able to ambulate but is painful.  Normal strength and reflexes on exam.  Denies trauma.  Currently seeing specialist for more definitive care but requesting muscle relaxers today as she ran out of the Robaxin and reports her pain is worse without it.  Also had significant improvement with the Medrol Dosepak 2 weeks ago requesting repeat dose.  No history of diabetes. Will give pt pain control and to return for developement of above sx.         Final Clinical Impression(s) / ED Diagnoses Final diagnoses:  Lumbar radiculopathy, chronic    Rx / DC Orders ED Discharge Orders          Ordered    methocarbamol (ROBAXIN) 500 MG tablet  2 times daily        10/04/22 1831    methylPREDNISolone (MEDROL DOSEPAK) 4 MG TBPK tablet        10/04/22 1831              Gwyneth Sprout, MD 10/04/22 [phone]

## 2022-10-08 ENCOUNTER — Telehealth: Payer: Self-pay | Admitting: Physical Medicine and Rehabilitation

## 2022-10-08 NOTE — Telephone Encounter (Signed)
Pt called for referral appt for back injection. Please call pt at [phone].

## 2022-10-13 HISTORY — PX: MICRODISCECTOMY LUMBAR: SUR864

## 2022-10-21 ENCOUNTER — Ambulatory Visit: Payer: Self-pay

## 2022-10-21 ENCOUNTER — Ambulatory Visit (INDEPENDENT_AMBULATORY_CARE_PROVIDER_SITE_OTHER): Payer: 59 | Admitting: Physical Medicine and Rehabilitation

## 2022-10-21 VITALS — BP 119/80 | HR 81

## 2022-10-21 DIAGNOSIS — M5416 Radiculopathy, lumbar region: Secondary | ICD-10-CM

## 2022-10-21 MED ORDER — METHYLPREDNISOLONE ACETATE 80 MG/ML IJ SUSP
80.0000 mg | Freq: Once | INTRAMUSCULAR | Status: AC
  Administered 2022-10-21: 80 mg

## 2022-10-21 NOTE — Patient Instructions (Signed)

## 2022-10-21 NOTE — Progress Notes (Signed)
Functional Pain Scale - descriptive words and definitions  Immobilizing (10)   Unable to move or talk due to intensity of pain/unable to sleep and unable to use distraction. Severe range order  Average Pain  varies but has gotten worse   +Driver, -BT, -Dye Allergies.  Lower back pain that radiates in the left leg. Putting pressure on right side when walking

## 2022-11-02 NOTE — Progress Notes (Signed)
Betty Shelton - 37 y.o. female MRN [id number]  Date of birth: 1986-06-25  Office Visit Note: Visit Date: 10/21/2022 PCP: de [place], Raymond J, MD Referred by: de [place], Raymond J, MD  Subjective: Chief Complaint  Patient presents with   Lower Back - Pain   HPI:  Betty Shelton is a 37 y.o. female who comes in today at the request of Dr. Huel Cote for planned Left L5-S1 Lumbar Transforaminal epidural steroid injection with fluoroscopic guidance.  The patient has failed conservative care including home exercise, medications, time and activity modification.  This injection will be diagnostic and hopefully therapeutic.  Please see requesting physician notes for further details and justification.   ROS Otherwise per HPI.  Assessment & Plan: Visit Diagnoses:    ICD-10-CM   1. Lumbar radiculopathy  M54.16 XR C-ARM NO REPORT    Epidural Steroid injection    methylPREDNISolone acetate (DEPO-MEDROL) injection 80 mg      Plan: No additional findings.   Meds & Orders:  Meds ordered this encounter  Medications   methylPREDNISolone acetate (DEPO-MEDROL) injection 80 mg    Orders Placed This Encounter  Procedures   XR C-ARM NO REPORT   Epidural Steroid injection    Follow-up: Return for visit to requesting provider as needed.   Procedures: No procedures performed  Lumbosacral Transforaminal Epidural Steroid Injection - Sub-Pedicular Approach with Fluoroscopic Guidance  Patient: Betty Shelton      Date of Birth: 08-16-1986 MRN: [id number] PCP: de [place], Raymond J, MD      Visit Date: 10/21/2022   Universal Protocol:    Date/Time: 10/21/2022  Consent Given By: the patient  Position: PRONE  Additional Comments: Vital signs were monitored before and after the procedure. Patient was prepped and draped in the usual sterile fashion. The correct patient, procedure, and site was verified.   Injection Procedure Details:   Procedure diagnoses: Lumbar radiculopathy [M54.16]     Meds Administered:  Meds ordered this encounter  Medications   methylPREDNISolone acetate (DEPO-MEDROL) injection 80 mg    Laterality: Left  Location/Site: L5  Needle:5.0 in., 22 ga.  Short bevel or Quincke spinal needle  Needle Placement: Transforaminal  Findings:    -Comments: Excellent flow of contrast along the nerve, nerve root and into the epidural space.  Procedure Details: After squaring off the end-plates to get a true AP view, the C-arm was positioned so that an oblique view of the foramen as noted above was visualized. The target area is just inferior to the "nose of the scotty dog" or sub pedicular. The soft tissues overlying this structure were infiltrated with 2-3 ml. of 1% Lidocaine without Epinephrine.  The spinal needle was inserted toward the target using a "trajectory" view along the fluoroscope beam.  Under AP and lateral visualization, the needle was advanced so it did not puncture dura and was located close the 6 O'Clock position of the pedical in AP tracterory. Biplanar projections were used to confirm position. Aspiration was confirmed to be negative for CSF and/or blood. A 1-2 ml. volume of Isovue-250 was injected and flow of contrast was noted at each level. Radiographs were obtained for documentation purposes.   After attaining the desired flow of contrast documented above, a 0.5 to 1.0 ml test dose of 0.25% Marcaine was injected into each respective transforaminal space.  The patient was observed for 90 seconds post injection.  After no sensory deficits were reported, and normal lower extremity motor function was noted,  the above injectate was administered so that equal amounts of the injectate were placed at each foramen (level) into the transforaminal epidural space.   Additional Comments:  No complications occurred Dressing: 2 x 2 sterile gauze and Band-Aid    Post-procedure details: Patient was observed during the procedure. Post-procedure  instructions were reviewed.  Patient left the clinic in stable condition.    Clinical History: MRI LUMBAR SPINE WITHOUT CONTRAST   TECHNIQUE: Multiplanar, multisequence MR imaging of the lumbar spine was performed. No intravenous contrast was administered.   COMPARISON:  None Available.   FINDINGS: Segmentation:  Standard.   Alignment:  Physiologic.   Vertebrae: No acute fracture, evidence of discitis, or aggressive bone lesion.   Conus medullaris and cauda equina: Conus extends to the L1 level. Conus and cauda equina appear normal.   Paraspinal and other soft tissues: No acute paraspinal abnormality.   Disc levels:   Disc spaces: Degenerative disease with disc height loss at L2-3, L3-4 and L4-5.   T12-L1: No significant disc bulge. No neural foraminal stenosis. No central canal stenosis.   L1-L2: No significant disc bulge. No neural foraminal stenosis. No central canal stenosis.   L2-L3: Minimal broad-based disc bulge with a central annular fissure. No foraminal or central canal stenosis.   L3-L4: Minimal broad-based disc bulge with a small central disc protrusion. Mild bilateral facet arthropathy. No foraminal or central canal stenosis.   L4-L5: Broad-based disc bulge with a left paracentral disc extrusion with mass effect on the left intraspinal L5 nerve root. Mild bilateral facet arthropathy. No foraminal stenosis. No spinal stenosis.   L5-S1: Mild broad-based disc bulge with a small central disc protrusion. No foraminal or central canal stenosis. Mild bilateral facet arthropathy. No spinal stenosis.   IMPRESSION: 1. At L4-5 there is a broad-based disc bulge with a left paracentral disc extrusion with mass effect on the left intraspinal L5 nerve root. Mild bilateral facet arthropathy. 2. At L3-4 there is a minimal broad-based disc bulge with a small central disc protrusion. Mild bilateral facet arthropathy. 3. At L5-S1 there is a mild broad-based disc  bulge with a small central disc protrusion. Mild bilateral facet arthropathy. 4. No acute osseous injury of the lumbar spine.     Electronically Signed   By: Kathreen Devoid M.D.   On: 10/03/2022 10:25     Objective:  VS:  HT:    WT:   BMI:     BP:119/80  HR:81bpm  TEMP: ( )  RESP:  Physical Exam Vitals and nursing note reviewed.  Constitutional:      General: She is not in acute distress.    Appearance: Normal appearance. She is obese. She is not ill-appearing.  HENT:     Head: Normocephalic and atraumatic.     Right Ear: External ear normal.     Left Ear: External ear normal.  Eyes:     Extraocular Movements: Extraocular movements intact.  Cardiovascular:     Rate and Rhythm: Normal rate.     Pulses: Normal pulses.  Pulmonary:     Effort: Pulmonary effort is normal. No respiratory distress.  Abdominal:     General: There is no distension.     Palpations: Abdomen is soft.  Musculoskeletal:        General: Tenderness present.     Cervical back: Neck supple.     Right lower leg: No edema.     Left lower leg: No edema.     Comments: Patient has good distal  strength with no pain over the greater trochanters.  No clonus or focal weakness.  Skin:    Findings: No erythema, lesion or rash.  Neurological:     General: No focal deficit present.     Mental Status: She is alert and oriented to person, place, and time.     Sensory: No sensory deficit.     Motor: No weakness or abnormal muscle tone.     Coordination: Coordination normal.  Psychiatric:        Mood and Affect: Mood normal.        Behavior: Behavior normal.      Imaging: No results found.

## 2022-11-02 NOTE — Procedures (Signed)
Lumbosacral Transforaminal Epidural Steroid Injection - Sub-Pedicular Approach with Fluoroscopic Guidance  Patient: Betty Shelton      Date of Birth: 04-10-1986 MRN: [id number] PCP: [place], Raymond J, MD      Visit Date: 10/21/2022   Universal Protocol:    Date/Time: 10/21/2022  Consent Given By: the patient  Position: PRONE  Additional Comments: Vital signs were monitored before and after the procedure. Patient was prepped and draped in the usual sterile fashion. The correct patient, procedure, and site was verified.   Injection Procedure Details:   Procedure diagnoses: Lumbar radiculopathy [M54.16]    Meds Administered:  Meds ordered this encounter  Medications   methylPREDNISolone acetate (DEPO-MEDROL) injection 80 mg    Laterality: Left  Location/Site: L5  Needle:5.0 in., 22 ga.  Short bevel or Quincke spinal needle  Needle Placement: Transforaminal  Findings:    -Comments: Excellent flow of contrast along the nerve, nerve root and into the epidural space.  Procedure Details: After squaring off the end-plates to get a true AP view, the C-arm was positioned so that an oblique view of the foramen as noted above was visualized. The target area is just inferior to the "nose of the scotty dog" or sub pedicular. The soft tissues overlying this structure were infiltrated with 2-3 ml. of 1% Lidocaine without Epinephrine.  The spinal needle was inserted toward the target using a "trajectory" view along the fluoroscope beam.  Under AP and lateral visualization, the needle was advanced so it did not puncture dura and was located close the 6 O'Clock position of the pedical in AP tracterory. Biplanar projections were used to confirm position. Aspiration was confirmed to be negative for CSF and/or blood. A 1-2 ml. volume of Isovue-250 was injected and flow of contrast was noted at each level. Radiographs were obtained for documentation purposes.   After attaining the desired  flow of contrast documented above, a 0.5 to 1.0 ml test dose of 0.25% Marcaine was injected into each respective transforaminal space.  The patient was observed for 90 seconds post injection.  After no sensory deficits were reported, and normal lower extremity motor function was noted,   the above injectate was administered so that equal amounts of the injectate were placed at each foramen (level) into the transforaminal epidural space.   Additional Comments:  No complications occurred Dressing: 2 x 2 sterile gauze and Band-Aid    Post-procedure details: Patient was observed during the procedure. Post-procedure instructions were reviewed.  Patient left the clinic in stable condition.

## 2022-11-10 ENCOUNTER — Other Ambulatory Visit (HOSPITAL_BASED_OUTPATIENT_CLINIC_OR_DEPARTMENT_OTHER): Payer: Self-pay | Admitting: Orthopaedic Surgery

## 2022-11-10 ENCOUNTER — Telehealth: Payer: Self-pay | Admitting: Orthopaedic Surgery

## 2022-11-10 MED ORDER — TRAMADOL HCL 50 MG PO TABS: 50.0000 mg | ORAL_TABLET | Freq: Four times a day (QID) | ORAL | 2 refills | Status: AC | PRN

## 2022-11-10 NOTE — Telephone Encounter (Signed)
Patient called in requesting Tramadol refill please advise

## 2022-11-16 IMAGING — US US PELVIS COMPLETE WITH TRANSVAGINAL
2 series · 13 of 25 positions shown · non-contrast
Comparison: 02/07/2021

CLINICAL DATA: Follow-up ovarian cyst, LMP 03/02/2021

EXAM:
TRANSABDOMINAL AND TRANSVAGINAL ULTRASOUND OF PELVIS
TECHNIQUE: Both transabdominal and transvaginal ultrasound examinations of the
pelvis were performed. Transabdominal technique was performed for
global imaging of the pelvis including uterus, ovaries, adnexal
regions, and pelvic cul-de-sac. It was necessary to proceed with
endovaginal exam following the transabdominal exam to visualize the
endometrium, and to characterize .

[Series 1: us pelvic complete with transvaginal · 79 acquisitions, 12 frames shown]
[im 1/79]
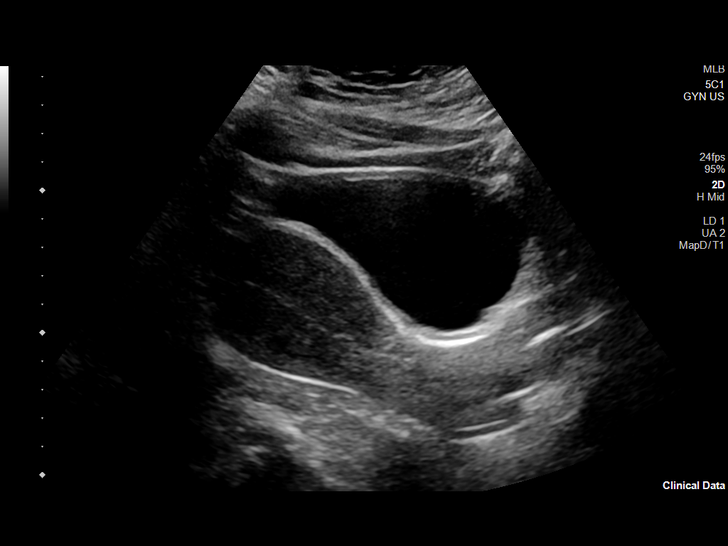
[im 7/79]
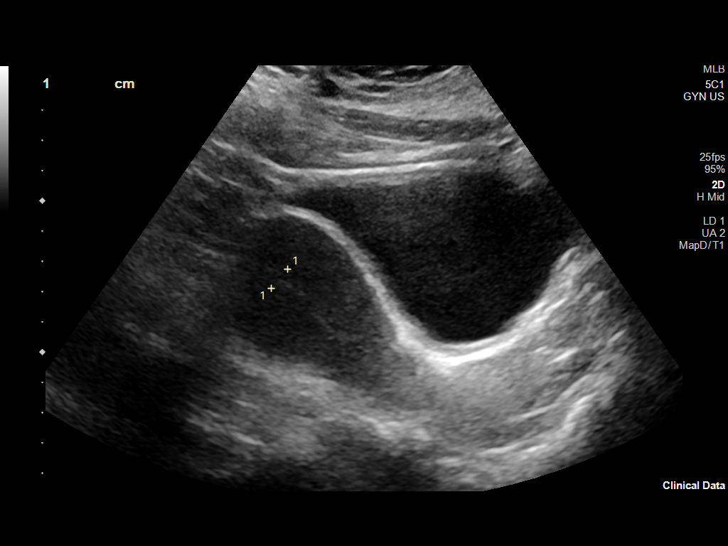
[im 14/79]
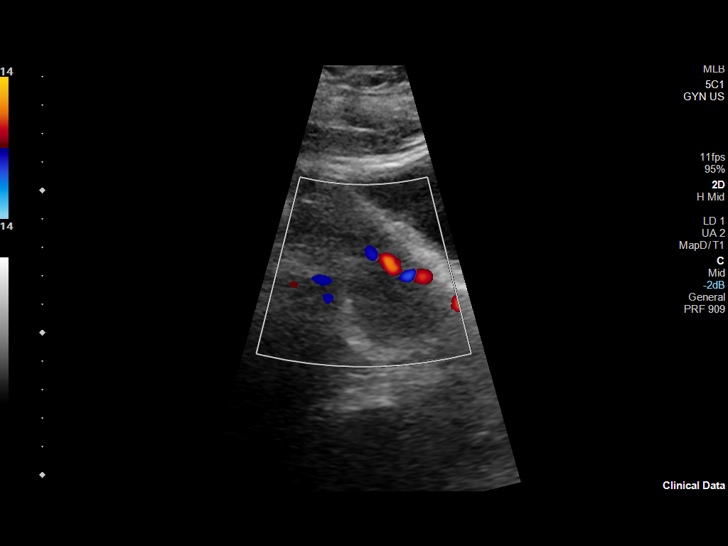
[im 21/79]
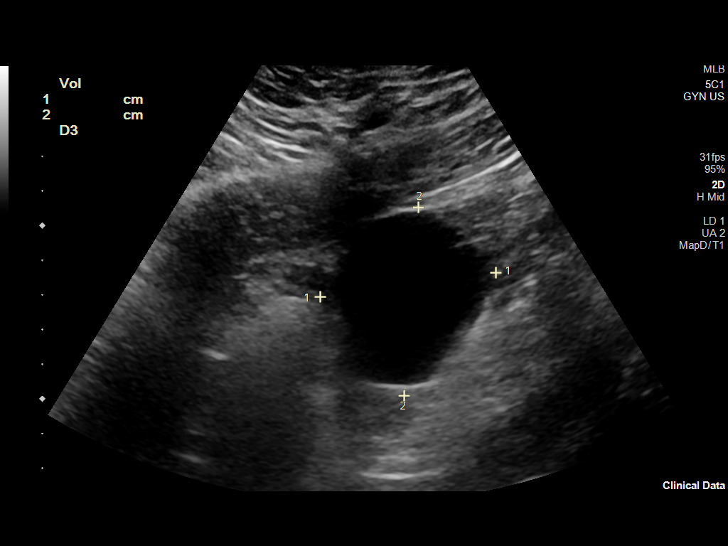
[im 28/79]
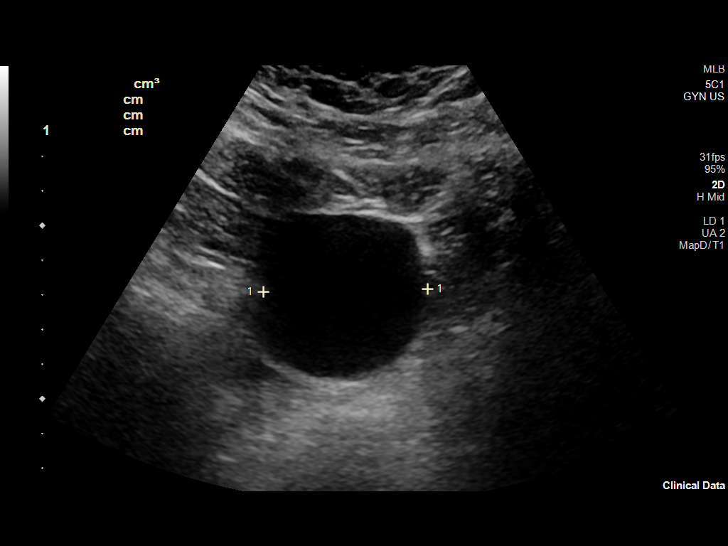
[im 34/79]
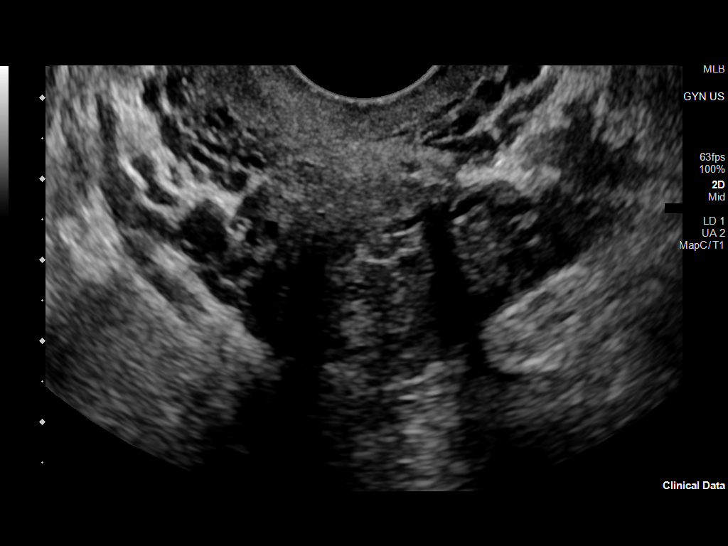
[im 41/79]
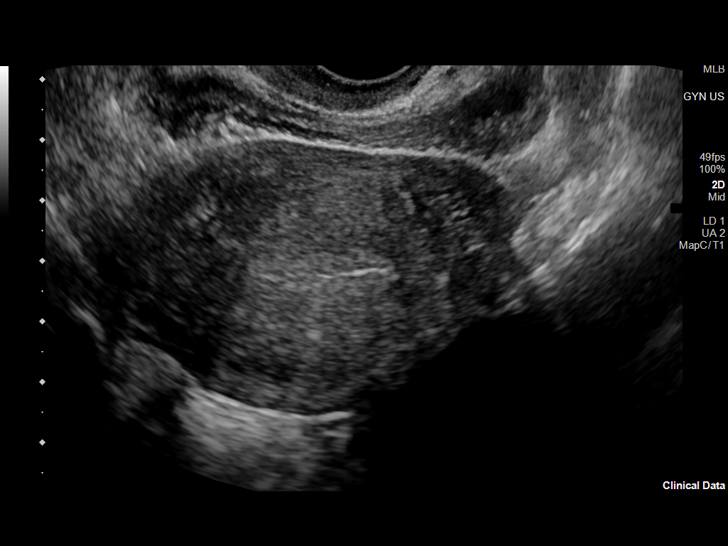
[im 48/79]
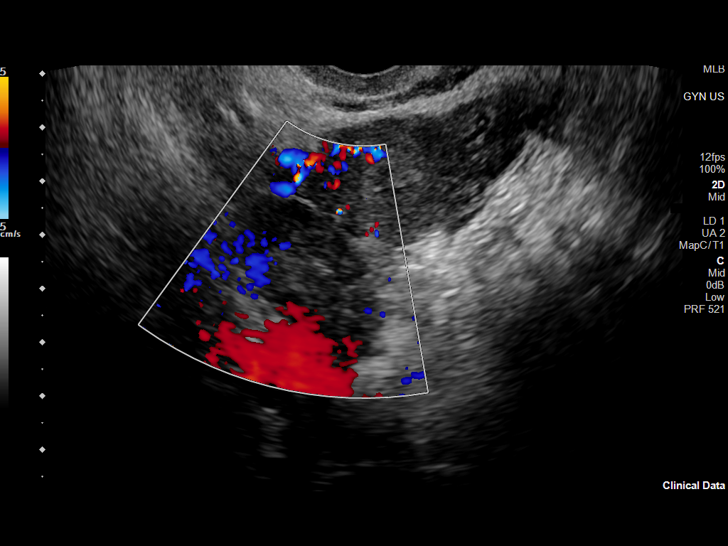
[im 55/79]
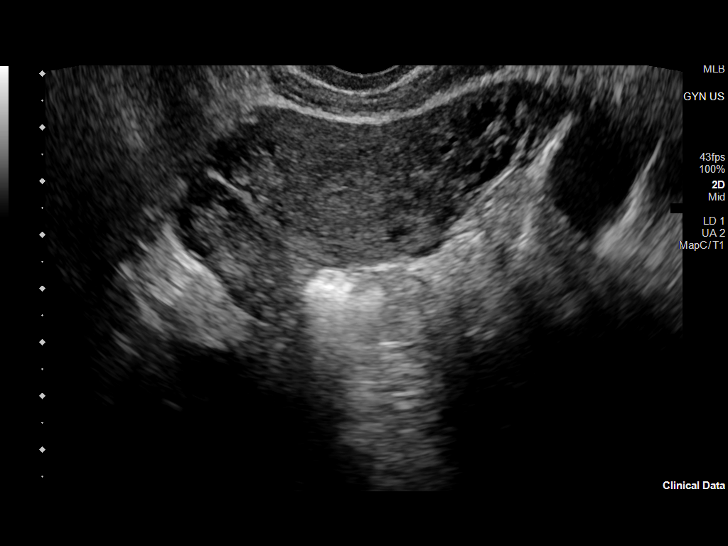
[im 62/79]
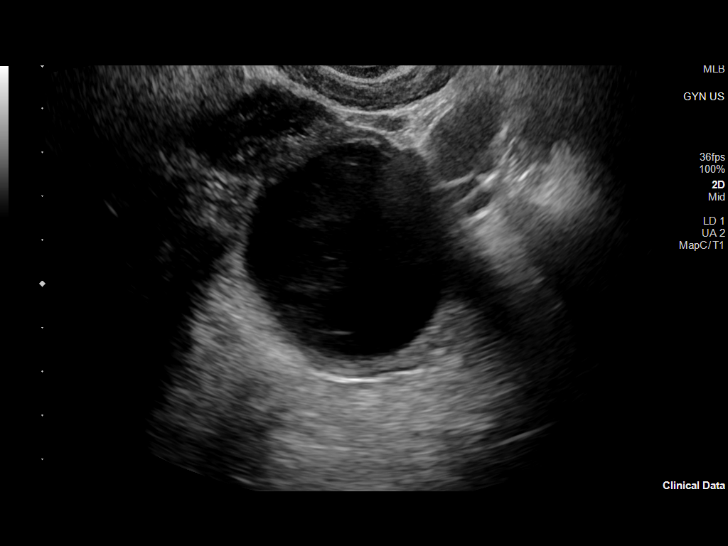
[im 68/79]
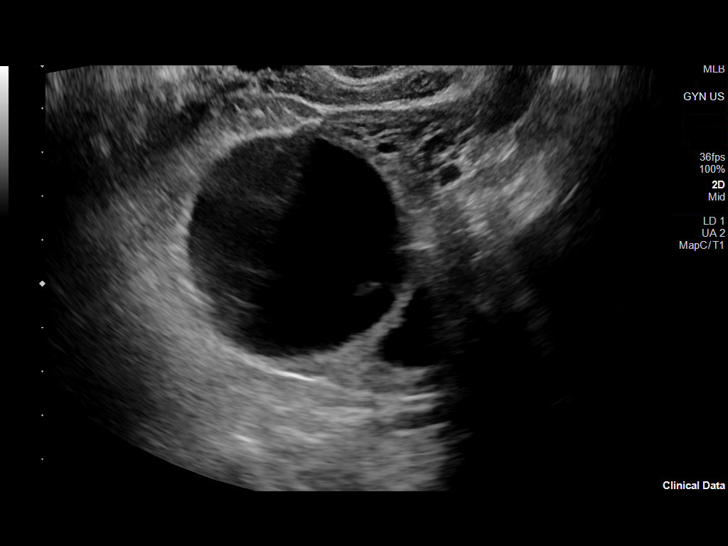
[im 75/79]
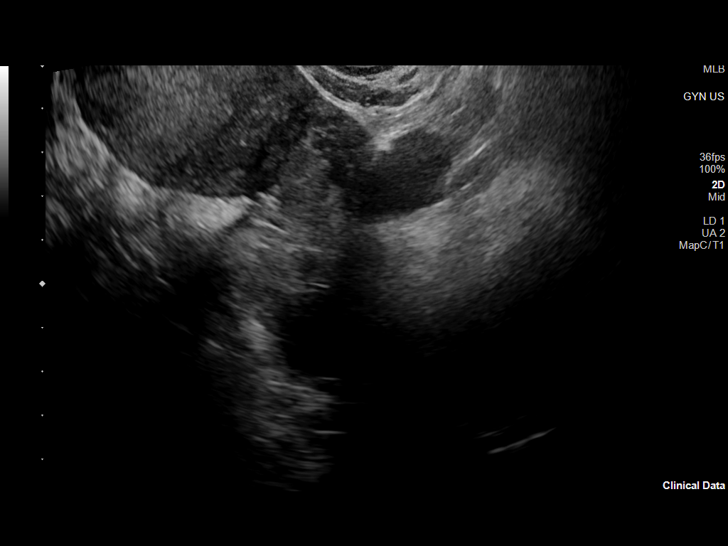

[Series 1001: gyn us · 1 of 2 slices shown]
[im 1/2]
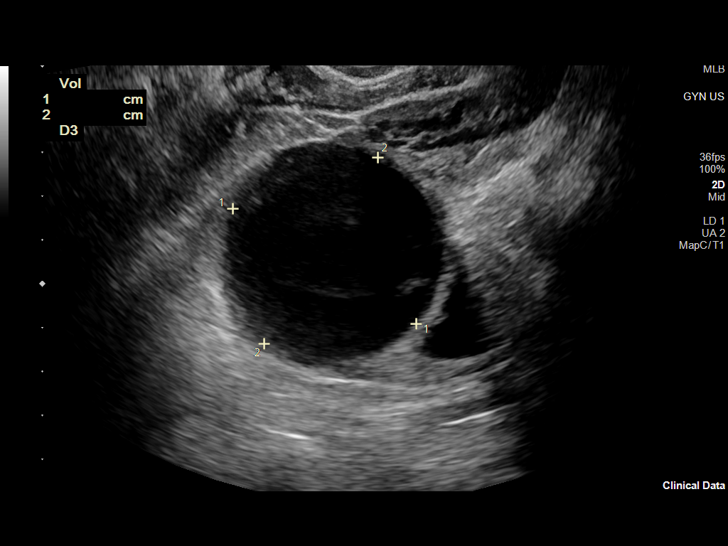

[13 of 25 positions shown; findings below may reference images not displayed]

FINDINGS: Uterus

Measurements: 9.3 x 4.9 x 7.3 cm = volume: 172 mL. Anteverted.
Normal morphology without mass

Endometrium

Thickness: 8 mm.  No endometrial fluid or focal abnormality

Right ovary

Measurements: 4.7 x 2.3 x 1.9 cm = volume: 10.5 mL. Normal
morphology without mass

Left ovary

Measurements: 7.6 x 5.5 x 5.2 cm = volume: 115 mL. Cyst within LEFT
ovary 4.9 x 5.0 x 4.7 cm (previously 6.8 x 6.1 x 6.5 cm), containing
small amount of low level internal echogenicity decreased in size
and complexity from previous exam consistent with resolving
hemorrhagic cyst.

Other findings

No free pelvic fluid.  No adnexal masses.
IMPRESSION: Resolving hemorrhagic cyst within LEFT ovary now measuring 5.0 cm
greatest size, previously 6.8 cm.

No new pelvic sonographic abnormalities.

## 2022-11-24 ENCOUNTER — Telehealth: Payer: Self-pay

## 2022-11-24 NOTE — Telephone Encounter (Signed)
Pt calling to report recent back surgery on 11/17/2022. Pt states she started experiencing some light clotting on 11/14/2022 and has been having vaginal bleeding since. Reports not supposed to have started bleeding until 2-3 days post op per cycle app. However, reports starting sooner and reports still having bleeding today. States bleeding today is a more moderate bleed and dark red in color w/ no signs of stopping. Not currently using any hormonal birth control. Pt wanting to know if this is normal or something to be expected? Was given abx but not until during surgery. Pt aware of JC's return being on 11/25/22 and is ok w/ waiting for response.

## 2022-11-25 NOTE — Telephone Encounter (Signed)
Can you reach out to see if she is still bleeding? May just have been from the stress of the impending surgery.

## 2022-11-25 NOTE — Telephone Encounter (Signed)
FYI. Pt confirmed bleeding stopped as of yesterday. Pt advised to notify us of any changes/new concerns. She voiced understanding. Will close encounter.

## 2023-01-07 ENCOUNTER — Ambulatory Visit (HOSPITAL_BASED_OUTPATIENT_CLINIC_OR_DEPARTMENT_OTHER): Payer: 59 | Attending: Radiology | Admitting: Physical Therapy

## 2023-01-07 ENCOUNTER — Encounter (HOSPITAL_BASED_OUTPATIENT_CLINIC_OR_DEPARTMENT_OTHER): Payer: Self-pay | Admitting: Physical Therapy

## 2023-01-07 DIAGNOSIS — M5459 Other low back pain: Secondary | ICD-10-CM | POA: Diagnosis present

## 2023-01-07 DIAGNOSIS — M62830 Muscle spasm of back: Secondary | ICD-10-CM | POA: Insufficient documentation

## 2023-01-07 NOTE — Therapy (Incomplete)
OUTPATIENT PHYSICAL THERAPY LOWER EXTREMITY EVALUATION   Patient Name: Betty Shelton MRN: [id number] DOB:05/01/1986, 37 y.o., female 35 Date: 01/07/2023  END OF SESSION:   Past Medical History:  Diagnosis Date   Allergy    Back pain    back pain began 05/2022   Medical history non-contributory    Morbid obesity with BMI of 50.0-59.9, adult [facility])    Past Surgical History:  Procedure Laterality Date   CESAREAN SECTION     CESAREAN SECTION N/A 08/16/2015   Procedure: CESAREAN SECTION;  Surgeon: Jonnie Kind, MD;  Location: [facility];  Service: Obstetrics;  Laterality: N/A;   IUD REMOVAL N/A 11/28/2013   Procedure: INTRAUTERINE DEVICE (IUD) REMOVAL;  Surgeon: Emily Filbert, MD;  Location: [facility];  Service: Gynecology;  Laterality: N/A;   TOOTH EXTRACTION  2014   WISDOM TOOTH EXTRACTION     Patient Active Problem List   Diagnosis Date Noted   Left hip pain 07/04/2022   Elevated BP without diagnosis of hypertension 07/01/2021   Otitis externa of right ear 06/21/2021   Hemorrhagic ovarian cyst 03/28/2021   Encounter to establish care 03/28/2021   Hidradenitis suppurativa 03/28/2021   Anxiety and depression 03/28/2021   Depression 07/23/2017   Women's annual routine gynecological examination 07/23/2017   Smoker 02/28/2016    PCP: ***  REFERRING PROVIDER: ***  REFERRING DIAG:    THERAPY DIAG:  No diagnosis found.  Rationale for Evaluation and Treatment: Rehabilitation  ONSET DATE: 11/17/2022 microdiskectomy   SUBJECTIVE:   SUBJECTIVE STATEMENT: ***  PERTINENT HISTORY: *** PAIN:  Are you having pain? Yes: NPRS scale: 1/10 right now. The worst a 4/10  Pain location: Left side of the low back; twinges on the right  Pain description: aching  Aggravating factors: standing and walking  Relieving factors: rest   PRECAUTIONS: Back micro diskectomy lifting 15 lbs   WEIGHT BEARING RESTRICTIONS: No  FALLS:  Has patient fallen in last 6 months? No  LIVING  ENVIRONMENT: 3 steps into the house  OCCUPATION:  Works at an office. Sits all day. Was given a new chair for when she returns.   Hobbies: back to dancing and playing with her kids   PLOF: Independent  PATIENT GOALS:  To improve endurance   NEXT MD VISIT:  Nothing scheduled   OBJECTIVE:   DIAGNOSTIC FINDINGS:  Nothing post op   PATIENT SURVEYS:  FOTO    COGNITION: Overall cognitive status: Within functional limits for tasks assessed     SENSATION: Pain down the lateral portion of her leg    MUSCLE LENGTH: Hamstrings: Right *** deg; Left *** deg Thomas test: Right *** deg; Left *** deg  POSTURE: rounded shoulders  PALPATION:   LOWER EXTREMITY ROM:  LUMBAR ROM:   Active  A/PROM  eval  Flexion   Extension   Right lateral flexion   Left lateral flexion   Right rotation   Left rotation    (Blank rows = not tested)   Passive ROM Right eval Left eval  Hip flexion    Hip extension    Hip abduction    Hip adduction    Hip internal rotation    Hip external rotation    Knee flexion    Knee extension    Ankle dorsiflexion    Ankle plantarflexion    Ankle inversion    Ankle eversion     (Blank rows = not tested)  LOWER EXTREMITY MMT:  MMT Right eval Left eval  Hip  flexion 25 21.2  Hip extension    Hip abduction 30.3 26.4  Hip adduction    Hip internal rotation    Hip external rotation    Knee flexion    Knee extension 31 30.4  Ankle dorsiflexion    Ankle plantarflexion    Ankle inversion    Ankle eversion     (Blank rows = not tested)  LOWER EXTREMITY SPECIAL TESTS:  {LEspecialtests:26242}  FUNCTIONAL TESTS:  {Functional tests:24029}  GAIT: Decreased hip flexion bilateral   TODAY'S TREATMENT:                                                                                                                              DATE:    PATIENT EDUCATION:  Education details: *** Person educated: {Person educated:25204} Education method:  {Education Method:25205} Education comprehension: {Education Comprehension:25206}  HOME EXERCISE PROGRAM: ***  ASSESSMENT:  CLINICAL IMPRESSION: Patient is a *** y.o. *** who was seen today for physical therapy evaluation and treatment for ***.   OBJECTIVE IMPAIRMENTS: {opptimpairments:25111}.   ACTIVITY LIMITATIONS: {activitylimitations:27494}  PARTICIPATION LIMITATIONS: {participationrestrictions:25113}  PERSONAL FACTORS: {Personal factors:25162} are also affecting patient's functional outcome.   REHAB POTENTIAL: {rehabpotential:25112}  CLINICAL DECISION MAKING: {clinical decision making:25114}  EVALUATION COMPLEXITY: {Evaluation complexity:25115}   GOALS: Goals reviewed with patient? {yes/no:20286}  SHORT TERM GOALS: Target date: *** *** Baseline: Goal status: {GOALSTATUS:25110}  2.  *** Baseline:  Goal status: {GOALSTATUS:25110}  3.  *** Baseline:  Goal status: {GOALSTATUS:25110}  4.  *** Baseline:  Goal status: {GOALSTATUS:25110}  5.  *** Baseline:  Goal status: {GOALSTATUS:25110}  6.  *** Baseline:  Goal status: {GOALSTATUS:25110}  LONG TERM GOALS: Target date: ***  *** Baseline:  Goal status: {GOALSTATUS:25110}  2.  *** Baseline:  Goal status: {GOALSTATUS:25110}  3.  *** Baseline:  Goal status: {GOALSTATUS:25110}  4.  *** Baseline:  Goal status: {GOALSTATUS:25110}  5.  *** Baseline:  Goal status: {GOALSTATUS:25110}  6.  *** Baseline:  Goal status: {GOALSTATUS:25110}   PLAN:  PT FREQUENCY: {rehab frequency:25116}  PT DURATION: {rehab duration:25117}  PLANNED INTERVENTIONS: {rehab planned interventions:25118::"Therapeutic exercises","Therapeutic activity","Neuromuscular re-education","Balance training","Gait training","Patient/Family education","Self Care","Joint mobilization"}  PLAN FOR NEXT SESSION: ***   Carney Living, PT 01/07/2023, 9:00 AM

## 2023-01-08 ENCOUNTER — Encounter (HOSPITAL_BASED_OUTPATIENT_CLINIC_OR_DEPARTMENT_OTHER): Payer: Self-pay | Admitting: Physical Therapy

## 2023-01-08 NOTE — Therapy (Signed)
Patient Name: Betty Shelton MRN: [id number] DOB:1986-06-08, 37 y.o., female Today's Date: 01/07/2023   END OF SESSION:   PT End of Session - 01/08/23 1353     Visit Number 1    Number of Visits 12    Date for PT Re-Evaluation 02/19/23    PT Start Time 0845    PT Stop Time 0930    PT Time Calculation (min) 45 min    Activity Tolerance Patient tolerated treatment well    Behavior During Therapy [facility] for tasks assessed/performed                     Past Medical History:  Diagnosis Date   Allergy     Back pain      back pain began 05/2022   Medical history non-contributory     Morbid obesity with BMI of 50.0-59.9, adult [facility])           Past Surgical History:  Procedure Laterality Date   CESAREAN SECTION       CESAREAN SECTION N/A 08/16/2015    Procedure: CESAREAN SECTION;  Surgeon: Jonnie Kind, MD;  Location: [facility];  Service: Obstetrics;  Laterality: N/A;   IUD REMOVAL N/A 11/28/2013    Procedure: INTRAUTERINE DEVICE (IUD) REMOVAL;  Surgeon: Emily Filbert, MD;  Location: [facility];  Service: Gynecology;  Laterality: N/A;   TOOTH EXTRACTION   2014   WISDOM TOOTH EXTRACTION            Patient Active Problem List    Diagnosis Date Noted   Left hip pain 07/04/2022   Elevated BP without diagnosis of hypertension 07/01/2021   Otitis externa of right ear 06/21/2021   Hemorrhagic ovarian cyst 03/28/2021   Encounter to establish care 03/28/2021   Hidradenitis suppurativa 03/28/2021   Anxiety and depression 03/28/2021   Depression 07/23/2017   Women's annual routine gynecological examination 07/23/2017   Smoker 02/28/2016      PCP: Raymond de [place], MD   REFERRING PROVIDER: Vanetta Mulders, MD   REFERRING DIAG:      THERAPY DIAG:  No diagnosis found.   Rationale for Evaluation and Treatment: Rehabilitation   ONSET DATE: 11/17/2022 microdiskectomy    SUBJECTIVE:    SUBJECTIVE STATEMENT: Patient is a 37 y/o female who presents a history of lower back pain . Patient  started having back pain in August 2023. Patient performed trial of  physical therapy but had an acute flair up of her lower back pain . She was found to have an L5 nerve route impingement. She underwent a microdiscectomy on 11/17/2022. At this time her pain is well controlled. She is ambulating without a devic. She feels like her endurance is off compared to prior to her onset of pain.    PERTINENT HISTORY: Depression, anxiety, back pain  PAIN:  Are you having pain? Yes: NPRS scale: 1/10 right now. The worst is a 4/10  Pain location: Left side of the low back; twinges on the right  Pain description: aching  Aggravating factors: standing and walking  Relieving factors: rest    PRECAUTIONS: Back micro diskectomy lifting 15 lbs    WEIGHT BEARING RESTRICTIONS: No   FALLS:  Has patient fallen in last 6 months? 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    Hobbies: back to dancing and playing with her kids  PLOF: Independent   PATIENT GOALS:  To improve endurance    NEXT MD VISIT:  Nothing scheduled    OBJECTIVE:    DIAGNOSTIC FINDINGS:  Nothing post op    PATIENT SURVEYS:  FOTO     COGNITION: Overall cognitive status: Within functional limits for tasks assessed                         SENSATION: Pain down the lateral portion of her leg      POSTURE: rounded shoulders   PALPATION:     LOWER EXTREMITY ROM:   LUMBAR ROM:    Active  A/PROM  eval  Flexion  mild pain at end range   Extension  not tested   Right lateral flexion Mild pain at end range    Left lateral flexion Mild pain at end range    Right rotation    Left rotation     (Blank rows = not tested)    Passive ROM Right eval Left eval  Hip flexion      Hip extension      Hip abduction      Hip adduction      Hip internal rotation      Hip external rotation      Knee flexion      Knee extension       Ankle dorsiflexion      Ankle plantarflexion      Ankle inversion      Ankle eversion       (Blank rows = not tested) not tested today 2nd to time   LOWER EXTREMITY MMT:   MMT Right eval Left eval  Hip flexion 25 21.2  Hip extension      Hip abduction 30.3 26.4  Hip adduction      Hip internal rotation      Hip external rotation      Knee flexion      Knee extension 31 30.4  Ankle dorsiflexion      Ankle plantarflexion      Ankle inversion      Ankle eversion       (Blank rows = not tested)   LOWER EXTREMITY SPECIAL TESTS:     FUNCTIONAL TESTS:     GAIT: Decreased hip flexion bilateral    TODAY'S TREATMENT:                                                                                                                              DATE:   3/28  Bridges 2x10 SLR Hip abduction    PATIENT EDUCATION:  Education details: HEP Person educated: Patient Education method: Consulting civil engineer, Media planner, Corporate treasurer cues, Verbal cues, and Handouts Education comprehension: verbalized understanding, returned demonstration, verbal cues required, tactile cues required, and needs further education   HOME EXERCISE PROGRAM: Access Code: [email or web address] URL: [email or web address] Date: 01/08/2023 Prepared by: Carolyne Littles  Exercises - Seated Hip Abduction with  Resistance  - 1 x daily - 7 x weekly - 3 sets - 10 reps - Seated Knee Extension with Resistance  - 1 x daily - 7 x weekly - 3 sets - 10 reps - Shoulder extension with resistance - Neutral  - 1 x daily - 7 x weekly - 3 sets - 10 reps - Scapular Retraction with Resistance  - 1 x daily - 7 x weekly - 3 sets - 10 rep   ASSESSMENT:   CLINICAL IMPRESSION: Patient is a 37 y.o. female who was seen today S/P microdiskectomy on 11/17/2022. She presents with pain with end range spine movement and decreased bilateral lower extremity strength. She has limited ability to ambulate in the community without pain. She would benefit  from skilled therapy to improve her endurance with ambulation, and improve her ability to play with her kids.    OBJECTIVE IMPAIRMENTS: decreased activity tolerance, decreased endurance, and decreased strength.    ACTIVITY LIMITATIONS: sitting, standing, sleeping, and locomotion level   PARTICIPATION LIMITATIONS: cleaning, laundry, shopping, and community activity   PERSONAL FACTORS: 1 comorbidity: morbid obesity are also affecting patient's functional outcome.    REHAB POTENTIAL: Excellent   CLINICAL DECISION MAKING: Stable/uncomplicated   EVALUATION COMPLEXITY: Low     GOALS: Goals reviewed with patient? Yes   SHORT TERM GOALS: Target date: 01/21/2023 Patient will increase left LE strength by 5 lbs.  Baseline: Goal status: INITIAL   2.  Patient will be independent with HEP Baseline:  Goal status: INITIAL   3.  Patient will be able to stand for 20 minutes without increased low back pain  Baseline:  Goal status: INITIAL   LONG TERM GOALS: Target date: 02/19/2023     Patient will be able to walk community distances without increased low back pain  Baseline:  Goal status: INITIAL   2.  Patient will be independent with complete exercise program in order to return to the gym Baseline:  Goal status: INITIAL   3.  Patient will be able to dance without  pain.  Baseline:  Goal status: INITIAL       PLAN:   PT FREQUENCY: 1-2x/week   PT DURATION: 8 weeks   PLANNED INTERVENTIONS: Therapeutic exercises, Therapeutic activity, Neuromuscular re-education, Balance training, Gait training, Patient/Family education, Self Care, Joint mobilization, Joint manipulation, Aquatic Therapy, Dry Needling, Cryotherapy, Moist heat, Ionotophoresis 4mg /ml Dexamethasone, and Manual therapy   PLAN FOR NEXT SESSION: Review HEP and modify as needed. Consider LAQ and 3 way hip . Progress to  gym exercises as tolerated  Jaci Standard SPT 01/07/2023  Carney Living, PT 01/07/2023, 9:00 AM    During this treatment session, the therapist was present, participating in and directing the treatment.

## 2023-02-11 ENCOUNTER — Ambulatory Visit (HOSPITAL_BASED_OUTPATIENT_CLINIC_OR_DEPARTMENT_OTHER): Payer: 59 | Attending: Radiology | Admitting: Physical Therapy

## 2023-02-11 ENCOUNTER — Encounter (HOSPITAL_BASED_OUTPATIENT_CLINIC_OR_DEPARTMENT_OTHER): Payer: Self-pay | Admitting: Physical Therapy

## 2023-02-11 DIAGNOSIS — M62830 Muscle spasm of back: Secondary | ICD-10-CM | POA: Diagnosis present

## 2023-02-11 DIAGNOSIS — M5459 Other low back pain: Secondary | ICD-10-CM | POA: Insufficient documentation

## 2023-02-11 NOTE — Therapy (Signed)
Patient Name: JALEEL ACRES MRN: [id number] DOB:02-24-1986, 37 y.o., female Today's Date: 01/07/2023   END OF SESSION:   PT End of Session - 02/11/23 1606     Visit Number 2    Number of Visits 12    Date for PT Re-Evaluation 02/19/23    PT Start Time 1600    PT Stop Time 1643    PT Time Calculation (min) 43 min    Activity Tolerance Patient tolerated treatment well    Behavior During Therapy [facility] for tasks assessed/performed                     Past Medical History:  Diagnosis Date   Allergy     Back pain      back pain began 05/2022   Medical history non-contributory     Morbid obesity with BMI of 50.0-59.9, adult [facility])           Past Surgical History:  Procedure Laterality Date   CESAREAN SECTION       CESAREAN SECTION N/A 08/16/2015    Procedure: CESAREAN SECTION;  Surgeon: Tilda Burrow, MD;  Location: [facility];  Service: Obstetrics;  Laterality: N/A;   IUD REMOVAL N/A 11/28/2013    Procedure: INTRAUTERINE DEVICE (IUD) REMOVAL;  Surgeon: Allie Bossier, MD;  Location: [facility];  Service: Gynecology;  Laterality: N/A;   TOOTH EXTRACTION   2014   WISDOM TOOTH EXTRACTION            Patient Active Problem List    Diagnosis Date Noted   Left hip pain 07/04/2022   Elevated BP without diagnosis of hypertension 07/01/2021   Otitis externa of right ear 06/21/2021   Hemorrhagic ovarian cyst 03/28/2021   Encounter to establish care 03/28/2021   Hidradenitis suppurativa 03/28/2021   Anxiety and depression 03/28/2021   Depression 07/23/2017   Women's annual routine gynecological examination 07/23/2017   Smoker 02/28/2016      PCP: Raymond de [place], MD   REFERRING PROVIDER: Huel Cote, MD   REFERRING DIAG:    Diagnosis  M54.16 (ICD-10-CM) - Radiculopathy, lumbar region     THERAPY DIAG:  Other low  back pain  Muscle spasm    Rationale for Evaluation and Treatment: Rehabilitation   ONSET DATE: 11/17/2022 microdiskectomy    SUBJECTIVE:    SUBJECTIVE  STATEMENT: The patient reports her back has been doing well. She has had increased pain down her leg. She feels like the pain started when she had to sit down for long periods of time at work.   Eval: Patient is a 37 y/o female who presents a history of lower back pain . Patient started having back pain in August 2023. Patient performed trial of  physical therapy but had an acute flair up of her lower back pain . She was found to have an L5 nerve route impingement. She underwent a microdiscectomy on 11/17/2022. At this time her pain is well controlled. She is ambulating without a devic. She feels like her endurance is off compared to prior to her onset of pain.    PERTINENT HISTORY: Depression, anxiety, back pain  PAIN:  Are you having pain? Yes: NPRS scale: 1/10 right now. The worst is a 4/10  Pain location: Left side of the low back; twinges on the right  Pain description: aching  Aggravating factors: standing and walking  Relieving factors: rest    PRECAUTIONS: Back micro diskectomy lifting 15 lbs    WEIGHT BEARING RESTRICTIONS:  No   FALLS:  Has patient fallen in last 6 months? 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    Hobbies: back to dancing and playing with her kids    PLOF: Independent   PATIENT GOALS:  To improve endurance    NEXT MD VISIT:  Nothing scheduled    OBJECTIVE:    DIAGNOSTIC FINDINGS:  Nothing post op    PATIENT SURVEYS:  FOTO     COGNITION: Overall cognitive status: Within functional limits for tasks assessed                         SENSATION: Pain down the lateral portion of her leg      POSTURE: rounded shoulders   PALPATION:     LOWER EXTREMITY ROM:   LUMBAR ROM:    Active  A/PROM  eval  Flexion  mild pain at end range   Extension  not tested   Right lateral flexion Mild pain at end range    Left lateral flexion Mild pain at end  range    Right rotation    Left rotation     (Blank rows = not tested)    Passive ROM Right eval Left eval  Hip flexion      Hip extension      Hip abduction      Hip adduction      Hip internal rotation      Hip external rotation      Knee flexion      Knee extension      Ankle dorsiflexion      Ankle plantarflexion      Ankle inversion      Ankle eversion       (Blank rows = not tested) not tested today 2nd to time   LOWER EXTREMITY MMT:   MMT Right eval Left eval  Hip flexion 25 21.2  Hip extension      Hip abduction 30.3 26.4  Hip adduction      Hip internal rotation      Hip external rotation      Knee flexion      Knee extension 31 30.4  Ankle dorsiflexion      Ankle plantarflexion      Ankle inversion      Ankle eversion       (Blank rows = not tested)   LOWER EXTREMITY SPECIAL TESTS:     FUNCTIONAL TESTS:     GAIT: Decreased hip flexion bilateral    TODAY'S TREATMENT:                                                                                                                              DATE:   5/2 Nu step 5 min L2   Seated ball roll 5x  5 sec hold ball roll lateral to the right caused pain to the left felt ood.   Ball press 3x10   Row 3x10 red with cuing for breathing  Shoulder extension 3x10 with cuing for breathing   Hamstring stretch 3x20 sec. Suggested she do it at her desk .   3/28  Bridges 2x10 SLR Hip abduction    PATIENT EDUCATION:  Education details: HEP Person educated: Patient Education method: Programmer, multimedia, Demonstration, Actor cues, Verbal cues, and Handouts Education comprehension: verbalized understanding, returned demonstration, verbal cues required, tactile cues required, and needs further education   HOME EXERCISE PROGRAM: Access Code: [email or web address] URL: [email or web address] Date: 01/08/2023 Prepared by: Lorayne Bender  Exercises - Seated Hip Abduction with Resistance  - 1 x daily - 7 x weekly -  3 sets - 10 reps - Seated Knee Extension with Resistance  - 1 x daily - 7 x weekly - 3 sets - 10 reps - Shoulder extension with resistance - Neutral  - 1 x daily - 7 x weekly - 3 sets - 10 reps - Scapular Retraction with Resistance  - 1 x daily - 7 x weekly - 3 sets - 10 rep   ASSESSMENT:   CLINICAL IMPRESSION: The patient had a mild increase in pain with treatment despite keeping her activity and stretching limited and in pain free motion. She tolerated about 30 min of active activity. The rest of the time was spent going over how to use her program. We also discussed inflammatory foods and how they can play a role in pain. She was given a hamstring stretch to use at her desk. Therapy will continue to moniro foot symptoms.  OBJECTIVE IMPAIRMENTS: decreased activity tolerance, decreased endurance, and decreased strength.    ACTIVITY LIMITATIONS: sitting, standing, sleeping, and locomotion level   PARTICIPATION LIMITATIONS: cleaning, laundry, shopping, and community activity   PERSONAL FACTORS: 1 comorbidity: morbid obesity are also affecting patient's functional outcome.    REHAB POTENTIAL: Excellent   CLINICAL DECISION MAKING: Stable/uncomplicated   EVALUATION COMPLEXITY: Low     GOALS: Goals reviewed with patient? Yes   SHORT TERM GOALS: Target date: 01/21/2023 Patient will increase left LE strength by 5 lbs.  Baseline: Goal status: INITIAL   2.  Patient will be independent with HEP Baseline:  Goal status: INITIAL   3.  Patient will be able to stand for 20 minutes without increased low back pain  Baseline:  Goal status: INITIAL   LONG TERM GOALS: Target date: 02/19/2023     Patient will be able to walk community distances without increased low back pain  Baseline:  Goal status: INITIAL   2.  Patient will be independent with complete exercise program in order to return to the gym Baseline:  Goal status: INITIAL   3.  Patient will be able to dance without  pain.   Baseline:  Goal status: INITIAL       PLAN:   PT FREQUENCY: 1-2x/week   PT DURATION: 8 weeks   PLANNED INTERVENTIONS: Therapeutic exercises, Therapeutic activity, Neuromuscular re-education, Balance training, Gait training, Patient/Family education, Self Care, Joint mobilization, Joint manipulation, Aquatic Therapy, Dry Needling, Cryotherapy, Moist heat, Ionotophoresis 4mg /ml Dexamethasone, and Manual therapy   PLAN FOR NEXT SESSION: Review HEP and modify as needed. Consider LAQ and 3 way hip . Progress to  gym exercises as tolerated    Dessie Coma, PT  02/12/2023

## 2023-02-17 ENCOUNTER — Ambulatory Visit (HOSPITAL_BASED_OUTPATIENT_CLINIC_OR_DEPARTMENT_OTHER): Payer: 59 | Admitting: Physical Therapy

## 2023-02-24 ENCOUNTER — Encounter (HOSPITAL_BASED_OUTPATIENT_CLINIC_OR_DEPARTMENT_OTHER): Payer: Self-pay | Admitting: Physical Therapy

## 2023-02-24 ENCOUNTER — Ambulatory Visit (HOSPITAL_BASED_OUTPATIENT_CLINIC_OR_DEPARTMENT_OTHER): Payer: 59 | Admitting: Physical Therapy

## 2023-02-24 DIAGNOSIS — M5459 Other low back pain: Secondary | ICD-10-CM

## 2023-02-24 DIAGNOSIS — M62830 Muscle spasm of back: Secondary | ICD-10-CM

## 2023-02-24 NOTE — Therapy (Signed)
Patient Name: Betty Shelton MRN: [id number] DOB:02-07-1986, 37 y.o., female Today's Date: 01/07/2023   END OF SESSION:   PT End of Session - 02/24/23 1610     Visit Number 3    Number of Visits 12    Date for PT Re-Evaluation 02/19/23    PT Start Time 1600    PT Stop Time 1642    PT Time Calculation (min) 42 min    Activity Tolerance Patient tolerated treatment well    Behavior During Therapy [facility] for tasks assessed/performed                     Past Medical History:  Diagnosis Date   Allergy     Back pain      back pain began 05/2022   Medical history non-contributory     Morbid obesity with BMI of 50.0-59.9, adult [facility])           Past Surgical History:  Procedure Laterality Date   CESAREAN SECTION       CESAREAN SECTION N/A 08/16/2015    Procedure: CESAREAN SECTION;  Surgeon: Tilda Burrow, MD;  Location: [facility];  Service: Obstetrics;  Laterality: N/A;   IUD REMOVAL N/A 11/28/2013    Procedure: INTRAUTERINE DEVICE (IUD) REMOVAL;  Surgeon: Allie Bossier, MD;  Location: [facility];  Service: Gynecology;  Laterality: N/A;   TOOTH EXTRACTION   2014   WISDOM TOOTH EXTRACTION            Patient Active Problem List    Diagnosis Date Noted   Left hip pain 07/04/2022   Elevated BP without diagnosis of hypertension 07/01/2021   Otitis externa of right ear 06/21/2021   Hemorrhagic ovarian cyst 03/28/2021   Encounter to establish care 03/28/2021   Hidradenitis suppurativa 03/28/2021   Anxiety and depression 03/28/2021   Depression 07/23/2017   Women's annual routine gynecological examination 07/23/2017   Smoker 02/28/2016      PCP: Raymond de Peru, MD   REFERRING PROVIDER: Huel Cote, MD   REFERRING DIAG:    Diagnosis  M54.16 (ICD-10-CM) - Radiculopathy, lumbar region     THERAPY DIAG:  Other low  back pain  Muscle spasm    Rationale for Evaluation and Treatment: Rehabilitation   ONSET DATE: 11/17/2022 microdiskectomy    SUBJECTIVE:    SUBJECTIVE  STATEMENT: The patient was  Eval: Patient is a 37 y/o female who presents a history of lower back pain . Patient started having back pain in August 2023. Patient performed trial of  physical therapy but had an acute flair up of her lower back pain . She was found to have an L5 nerve route impingement. She underwent a microdiscectomy on 11/17/2022. At this time her pain is well controlled. She is ambulating without a devic. She feels like her endurance is off compared to prior to her onset of pain.    PERTINENT HISTORY: Depression, anxiety, back pain  PAIN:  Are you having pain? Yes: NPRS scale: 1/10 right now. The worst is a 4/10  Pain location: Left side of the low back; twinges on the right  Pain description: aching  Aggravating factors: standing and walking  Relieving factors: rest    PRECAUTIONS: Back micro diskectomy lifting 15 lbs    WEIGHT BEARING RESTRICTIONS: No   FALLS:  Has patient fallen in last 6 months? 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    Hobbies: back to dancing and playing with her kids    PLOF: Independent   PATIENT GOALS:  To improve endurance    NEXT MD VISIT:  Nothing scheduled    OBJECTIVE:    DIAGNOSTIC FINDINGS:  Nothing post op    PATIENT SURVEYS:  FOTO     COGNITION: Overall cognitive status: Within functional limits for tasks assessed                         SENSATION: Pain down the lateral portion of her leg      POSTURE: rounded shoulders   PALPATION:     LOWER EXTREMITY ROM:   LUMBAR ROM:    Active  A/PROM  eval  Flexion  mild pain at end range   Extension  not tested   Right lateral flexion Mild pain at end range    Left lateral flexion Mild pain at end range    Right rotation    Left rotation     (Blank rows = not tested)    Passive ROM Right eval Left eval  Hip flexion      Hip extension      Hip abduction       Hip adduction      Hip internal rotation      Hip external rotation      Knee flexion      Knee extension      Ankle dorsiflexion      Ankle plantarflexion      Ankle inversion      Ankle eversion       (Blank rows = not tested) not tested today 2nd to time   LOWER EXTREMITY MMT:   MMT Right eval Left eval  Hip flexion 25 21.2  Hip extension      Hip abduction 30.3 26.4  Hip adduction      Hip internal rotation      Hip external rotation      Knee flexion      Knee extension 31 30.4  Ankle dorsiflexion      Ankle plantarflexion      Ankle inversion      Ankle eversion       (Blank rows = not tested)   LOWER EXTREMITY SPECIAL TESTS:     FUNCTIONAL TESTS:     GAIT: Decreased hip flexion bilateral    TODAY'S TREATMENT:                                                                                                                              DATE:   5/14 Nu-step 5 min   Bilateral ER 2x10  Bilateral Horizontal abduction 2x10 red   Standing forward weight shift 2x10  Standing low march 2x10   Ball roll bilateral 5x 10 sec hold fwd and lateral   Reviewed and updated HEP  5/2 Nu step 5 min L2   Seated ball roll 5x 5 sec hold ball roll lateral to the right caused pain to the left felt ood.   Ball press 3x10   Row 3x10 red with cuing for breathing  Shoulder extension 3x10 with cuing for breathing   Hamstring stretch 3x20 sec. Suggested she do it at her desk .   3/28  Bridges 2x10 SLR Hip abduction    PATIENT EDUCATION:  Education details: HEP Person educated: Patient Education method: Programmer, multimedia, Demonstration, Actor cues, Verbal cues, and Handouts Education comprehension: verbalized understanding, returned demonstration, verbal cues required, tactile cues required, and needs further education   HOME EXERCISE PROGRAM: Access Code: [email or web address] URL: [email or web address] Date: 01/08/2023 Prepared by: Lorayne Bender  Exercises - Seated Hip Abduction with Resistance  - 1 x daily - 7 x weekly - 3 sets - 10 reps - Seated Knee Extension with Resistance  - 1 x daily - 7 x weekly - 3 sets - 10 reps - Shoulder extension with resistance - Neutral  - 1 x daily - 7 x weekly - 3 sets - 10 reps - Scapular Retraction with Resistance  - 1 x daily - 7 x weekly - 3 sets - 10 rep   ASSESSMENT:   CLINICAL IMPRESSION: The patient tolerated treatment well. She hasn't had as much pain. We reviewed 2 seated scapular exercises. We also reviewed a standing exercise. She had increased pain into her foot with standing exercises.   OBJECTIVE IMPAIRMENTS: decreased activity tolerance, decreased endurance, and decreased strength.    ACTIVITY LIMITATIONS: sitting, standing, sleeping, and locomotion level   PARTICIPATION LIMITATIONS: cleaning, laundry, shopping, and community activity   PERSONAL FACTORS: 1 comorbidity: morbid obesity are also affecting patient's functional outcome.    REHAB POTENTIAL: Excellent   CLINICAL DECISION MAKING: Stable/uncomplicated   EVALUATION COMPLEXITY: Low     GOALS: Goals reviewed with patient? Yes   SHORT TERM GOALS: Target date: 01/21/2023 Patient will increase left LE strength by 5 lbs.  Baseline: Goal status: INITIAL   2.  Patient will be independent with HEP Baseline:  Goal status: INITIAL   3.  Patient will be able to stand for 20 minutes without increased low back pain  Baseline:  Goal status: INITIAL   LONG TERM GOALS: Target date: 02/19/2023     Patient will be able to walk community distances without increased low back pain  Baseline:  Goal status: INITIAL   2.  Patient will be independent with complete exercise program in order to return to the gym Baseline:  Goal status: INITIAL   3.  Patient will be able to dance without  pain.  Baseline:  Goal status: INITIAL       PLAN:   PT FREQUENCY: 1-2x/week   PT DURATION: 8 weeks   PLANNED  INTERVENTIONS: Therapeutic exercises, Therapeutic activity, Neuromuscular re-education, Balance training, Gait training, Patient/Family education, Self Care, Joint mobilization, Joint manipulation, Aquatic Therapy, Dry Needling, Cryotherapy, Moist heat, Ionotophoresis 4mg /ml Dexamethasone, and Manual therapy   PLAN FOR NEXT SESSION: Review HEP and modify as needed. Consider LAQ and 3 way hip . Progress to  gym exercises as tolerated    Dessie Coma, PT  02/24/2023

## 2023-02-25 ENCOUNTER — Encounter (HOSPITAL_BASED_OUTPATIENT_CLINIC_OR_DEPARTMENT_OTHER): Payer: Self-pay | Admitting: Physical Therapy

## 2023-03-03 ENCOUNTER — Ambulatory Visit (HOSPITAL_BASED_OUTPATIENT_CLINIC_OR_DEPARTMENT_OTHER): Payer: 59 | Admitting: Physical Therapy

## 2023-03-03 ENCOUNTER — Encounter (HOSPITAL_BASED_OUTPATIENT_CLINIC_OR_DEPARTMENT_OTHER): Payer: Self-pay | Admitting: Physical Therapy

## 2023-03-03 DIAGNOSIS — M5459 Other low back pain: Secondary | ICD-10-CM

## 2023-03-03 DIAGNOSIS — M62830 Muscle spasm of back: Secondary | ICD-10-CM

## 2023-03-03 NOTE — Therapy (Signed)
Patient Name: Betty Shelton MRN: [id number] DOB:01/08/1986, 37 y.o., female Today's Date: 01/07/2023   END OF SESSION:   PT End of Session - 03/03/23 1605     Visit Number 4    Number of Visits 12    Date for PT Re-Evaluation 02/19/23                     Past Medical History:  Diagnosis Date   Allergy     Back pain      back pain began 05/2022   Medical history non-contributory     Morbid obesity with BMI of 50.0-59.9, adult [facility])           Past Surgical History:  Procedure Laterality Date   CESAREAN SECTION       CESAREAN SECTION N/A 08/16/2015    Procedure: CESAREAN SECTION;  Surgeon: Tilda Burrow, MD;  Location: [facility];  Service: Obstetrics;  Laterality: N/A;   IUD REMOVAL N/A 11/28/2013    Procedure: INTRAUTERINE DEVICE (IUD) REMOVAL;  Surgeon: Allie Bossier, MD;  Location: [facility];  Service: Gynecology;  Laterality: N/A;   TOOTH EXTRACTION   2014   WISDOM TOOTH EXTRACTION            Patient Active Problem List    Diagnosis Date Noted   Left hip pain 07/04/2022   Elevated BP without diagnosis of hypertension 07/01/2021   Otitis externa of right ear 06/21/2021   Hemorrhagic ovarian cyst 03/28/2021   Encounter to establish care 03/28/2021   Hidradenitis suppurativa 03/28/2021   Anxiety and depression 03/28/2021   Depression 07/23/2017   Women's annual routine gynecological examination 07/23/2017   Smoker 02/28/2016      PCP: Raymond de Peru, MD   REFERRING PROVIDER: Huel Cote, MD   REFERRING DIAG:    Diagnosis  M54.16 (ICD-10-CM) - Radiculopathy, lumbar region     THERAPY DIAG:  Other low  back pain  Muscle spasm    Rationale for Evaluation and Treatment: Rehabilitation   ONSET DATE: 11/17/2022 microdiskectomy    SUBJECTIVE:    SUBJECTIVE STATEMENT: The patient is making some progress. She has to get up less at work. She is having less pain into her foot. She has been using her stretches.   Eval: Patient is a 37 y/o female who presents a  history of lower back pain . Patient started having back pain in August 2023. Patient performed trial of  physical therapy but had an acute flair up of her lower back pain . She was found to have an L5 nerve route impingement. She underwent a microdiscectomy on 11/17/2022. At this time her pain is well controlled. She is ambulating without a devic. She feels like her endurance is off compared to prior to her onset of pain.    PERTINENT HISTORY: Depression, anxiety, back pain  PAIN:  Are you having pain? Yes: NPRS scale: 1/10 right now. The worst is a 4/10  Pain location: Left side of the low back; twinges on the right  Pain description: aching  Aggravating factors: standing and walking  Relieving factors: rest    PRECAUTIONS: Back micro diskectomy lifting 15 lbs    WEIGHT BEARING RESTRICTIONS: No   FALLS:  Has patient fallen in last 6 months? 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    Hobbies: back to dancing and playing  with her kids    PLOF: Independent   PATIENT GOALS:  To improve endurance    NEXT MD VISIT:  Nothing scheduled    OBJECTIVE:    DIAGNOSTIC FINDINGS:  Nothing post op    PATIENT SURVEYS:  FOTO     COGNITION: Overall cognitive status: Within functional limits for tasks assessed                         SENSATION: Pain down the lateral portion of her leg      POSTURE: rounded shoulders   PALPATION:     LOWER EXTREMITY ROM:   LUMBAR ROM:    Active  A/PROM  eval  Flexion  mild pain at end range   Extension  not tested   Right lateral flexion Mild pain at end range    Left lateral flexion Mild pain at end range    Right rotation    Left rotation     (Blank rows = not tested)    Passive ROM Right eval Left eval  Hip flexion      Hip extension      Hip abduction      Hip adduction      Hip internal rotation      Hip external rotation       Knee flexion      Knee extension      Ankle dorsiflexion      Ankle plantarflexion      Ankle inversion      Ankle eversion       (Blank rows = not tested) not tested today 2nd to time   LOWER EXTREMITY MMT:   MMT Right eval Left eval Right 5/21 Left 5/21  Hip flexion 25 21.2 50.1 37.1  Hip extension        Hip abduction 30.3 26.4 37.6 38.3  Hip adduction        Hip internal rotation        Hip external rotation        Knee flexion        Knee extension 31 30.4 39.0 39.7  Ankle dorsiflexion        Ankle plantarflexion        Ankle inversion        Ankle eversion         (Blank rows = not tested)   LOWER EXTREMITY SPECIAL TESTS:     FUNCTIONAL TESTS:     GAIT: Decreased hip flexion bilateral    TODAY'S TREATMENT:                                                                                                                              DATE:   5/21 Nu-step 5 min     5/14 Nu-step 5 min   Bilateral ER 2x10  Bilateral Horizontal abduction 2x10 red   Standing forward weight shift 2x10  Standing low march  2x10   Ball roll bilateral 5x 10 sec hold fwd and lateral   Reviewed and updated HEP   5/2 Nu step 5 min L2   Seated ball roll 5x 5 sec hold ball roll lateral to the right caused pain to the left felt ood.   Ball press 3x10   Row 3x10 red with cuing for breathing  Shoulder extension 3x10 with cuing for breathing   Hamstring stretch 3x20 sec. Suggested she do it at her desk .   3/28  Bridges 2x10 SLR Hip abduction    PATIENT EDUCATION:  Education details: HEP Person educated: Patient Education method: Programmer, multimedia, Demonstration, Actor cues, Verbal cues, and Handouts Education comprehension: verbalized understanding, returned demonstration, verbal cues required, tactile cues required, and needs further education   HOME EXERCISE PROGRAM: Access Code: [email or web address] URL: [email or web address] Date: 01/08/2023 Prepared by: Lorayne Bender  Exercises - Seated Hip Abduction with Resistance  - 1 x daily - 7 x weekly - 3 sets - 10 reps - Seated Knee Extension with Resistance  - 1 x daily - 7 x weekly - 3 sets - 10 reps - Shoulder extension with resistance - Neutral  - 1 x daily - 7 x weekly - 3 sets - 10 reps - Scapular Retraction with Resistance  - 1 x daily - 7 x weekly - 3 sets - 10 rep   ASSESSMENT:   CLINICAL IMPRESSION: The patient tolerated treatment well. She hasn't had as much pain. We reviewed 2 seated scapular exercises. We also reviewed a standing exercise. She had increased pain into her foot with standing exercises. We will continue to progress the standing exercises but will monitor her radicular symptoms.   OBJECTIVE IMPAIRMENTS: decreased activity tolerance, decreased endurance, and decreased strength.    ACTIVITY LIMITATIONS: sitting, standing, sleeping, and locomotion level   PARTICIPATION LIMITATIONS: cleaning, laundry, shopping, and community activity   PERSONAL FACTORS: 1 comorbidity: morbid obesity are also affecting patient's functional outcome.    REHAB POTENTIAL: Excellent   CLINICAL DECISION MAKING: Stable/uncomplicated   EVALUATION COMPLEXITY: Low     GOALS: Goals reviewed with patient? Yes   SHORT TERM GOALS: Target date: 01/21/2023 Patient will increase left LE strength by 5 lbs.  Baseline: Goal status: INITIAL   2.  Patient will be independent with HEP Baseline:  Goal status: INITIAL   3.  Patient will be able to stand for 20 minutes without increased low back pain  Baseline:  Goal status: INITIAL   LONG TERM GOALS: Target date: 02/19/2023     Patient will be able to walk community distances without increased low back pain  Baseline:  Goal status: INITIAL   2.  Patient will be independent with complete exercise program in order to return to the gym Baseline:  Goal status: INITIAL   3.  Patient will be able to dance without  pain.  Baseline:  Goal status:  INITIAL       PLAN:   PT FREQUENCY: 1-2x/week   PT DURATION: 8 weeks   PLANNED INTERVENTIONS: Therapeutic exercises, Therapeutic activity, Neuromuscular re-education, Balance training, Gait training, Patient/Family education, Self Care, Joint mobilization, Joint manipulation, Aquatic Therapy, Dry Needling, Cryotherapy, Moist heat, Ionotophoresis 4mg /ml Dexamethasone, and Manual therapy   PLAN FOR NEXT SESSION: Review HEP and modify as needed. Consider LAQ and 3 way hip . Progress to  gym exercises as tolerated    Dessie Coma, PT  03/03/2023

## 2023-03-04 ENCOUNTER — Encounter (HOSPITAL_BASED_OUTPATIENT_CLINIC_OR_DEPARTMENT_OTHER): Payer: Self-pay | Admitting: Physical Therapy

## 2023-03-10 ENCOUNTER — Telehealth (HOSPITAL_BASED_OUTPATIENT_CLINIC_OR_DEPARTMENT_OTHER): Payer: Self-pay | Admitting: Physical Therapy

## 2023-03-10 ENCOUNTER — Ambulatory Visit (HOSPITAL_BASED_OUTPATIENT_CLINIC_OR_DEPARTMENT_OTHER): Payer: 59 | Admitting: Physical Therapy

## 2023-03-10 NOTE — Telephone Encounter (Signed)
Spoke with patient regarding missed visit. She was sick and thought she had canceled through mychart. She will be at her next visit.

## 2023-03-17 ENCOUNTER — Ambulatory Visit (HOSPITAL_BASED_OUTPATIENT_CLINIC_OR_DEPARTMENT_OTHER): Payer: 59 | Attending: Neurological Surgery | Admitting: Physical Therapy

## 2023-03-17 DIAGNOSIS — M5459 Other low back pain: Secondary | ICD-10-CM | POA: Insufficient documentation

## 2023-03-17 DIAGNOSIS — M62830 Muscle spasm of back: Secondary | ICD-10-CM | POA: Insufficient documentation

## 2023-03-17 NOTE — Therapy (Signed)
Patient Name: Betty Shelton MRN: [id number] DOB:[birth year], 37 y.o., female Today's Date: 01/07/2023   END OF SESSION:   PT End of Session - 03/17/23 1604     Visit Number 5    Number of Visits 12    Date for PT Re-Evaluation 04/15/23    PT Start Time 1600    PT Stop Time 1642    PT Time Calculation (min) 42 min    Activity Tolerance Patient tolerated treatment well    Behavior During Therapy [facility] for tasks assessed/performed                     Past Medical History:  Diagnosis Date   Allergy     Back pain      back pain began 05/2022   Medical history non-contributory     Morbid obesity with BMI of 50.0-59.9, adult [facility])           Past Surgical History:  Procedure Laterality Date   CESAREAN SECTION       CESAREAN SECTION N/A 08/16/2015    Procedure: CESAREAN SECTION;  Surgeon: Tilda Burrow, MD;  Location: [facility];  Service: Obstetrics;  Laterality: N/A;   IUD REMOVAL N/A 11/28/2013    Procedure: INTRAUTERINE DEVICE (IUD) REMOVAL;  Surgeon: Allie Bossier, MD;  Location: [facility];  Service: Gynecology;  Laterality: N/A;   TOOTH EXTRACTION   2014   WISDOM TOOTH EXTRACTION            Patient Active Problem List    Diagnosis Date Noted   Left hip pain 07/04/2022   Elevated BP without diagnosis of hypertension 07/01/2021   Otitis externa of right ear 06/21/2021   Hemorrhagic ovarian cyst 03/28/2021   Encounter to establish care 03/28/2021   Hidradenitis suppurativa 03/28/2021   Anxiety and depression 03/28/2021   Depression 07/23/2017   Women's annual routine gynecological examination 07/23/2017   Smoker 02/28/2016      PCP: Raymond de Peru, MD   REFERRING PROVIDER: Huel Cote, MD   REFERRING DIAG:    Diagnosis  M54.16 (ICD-10-CM) - Radiculopathy, lumbar region     THERAPY DIAG:  Other low  back pain  Muscle spasm    Rationale for Evaluation and Treatment: Rehabilitation   ONSET DATE: 11/17/2022 microdiskectomy    SUBJECTIVE:    SUBJECTIVE  STATEMENT: The patient reports the pain down her leg is just there. It isn't too bad.  Eval: Patient is a 37 y/o female who presents a history of lower back pain . Patient started having back pain in August 2023. Patient performed trial of  physical therapy but had an acute flair up of her lower back pain . She was found to have an L5 nerve route impingement. She underwent a microdiscectomy on 11/17/2022. At this time her pain is well controlled. She is ambulating without a devic. She feels like her endurance is off compared to prior to her onset of pain.    PERTINENT HISTORY: Depression, anxiety, back pain  PAIN:  Are you having pain? Yes: NPRS scale: 1/10 right now. The worst is a 4/10  Pain location: Left side of the low back; twinges on the right  Pain description: aching  Aggravating factors: standing and walking  Relieving factors: rest    PRECAUTIONS: Back micro diskectomy lifting 15 lbs    WEIGHT BEARING RESTRICTIONS: No   FALLS:  Has patient fallen in last 6 months? 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    Hobbies: back to dancing and playing with her kids    PLOF: Independent   PATIENT GOALS:  To improve endurance    NEXT MD VISIT:  Nothing scheduled    OBJECTIVE:    DIAGNOSTIC FINDINGS:  Nothing post op    PATIENT SURVEYS:  FOTO     COGNITION: Overall cognitive status: Within functional limits for tasks assessed                         SENSATION: Pain down the lateral portion of her leg      POSTURE: rounded shoulders   PALPATION:     LOWER EXTREMITY ROM:   LUMBAR ROM:    Active  A/PROM  eval  Flexion  mild pain at end range   Extension  not tested   Right lateral flexion Mild pain at end range    Left lateral flexion Mild pain at end range    Right rotation    Left rotation     (Blank rows = not tested)    Passive ROM Right eval  Left eval  Hip flexion      Hip extension      Hip abduction      Hip adduction      Hip internal rotation      Hip external rotation      Knee flexion      Knee extension      Ankle dorsiflexion      Ankle plantarflexion      Ankle inversion      Ankle eversion       (Blank rows = not tested) not tested today 2nd to time   LOWER EXTREMITY MMT:   MMT Right eval Left eval Right 5/21 Left 5/21  Hip flexion 25 21.2 50.1 37.1  Hip extension        Hip abduction 30.3 26.4 37.6 38.3  Hip adduction        Hip internal rotation        Hip external rotation        Knee flexion        Knee extension 31 30.4 39.0 39.7  Ankle dorsiflexion        Ankle plantarflexion        Ankle inversion        Ankle eversion         (Blank rows = not tested)   LOWER EXTREMITY SPECIAL TESTS:     FUNCTIONAL TESTS:     GAIT: Decreased hip flexion bilateral    TODAY'S TREATMENT:                                                                                                                              DATE:   6/5 Nu-step 5 min   Newman Pies  roll 10x each way.  Ball press down x20 with 5 sec hold   LAQ 2x10 bilateral with cuing for posture   Hip abduction 3x15 red   Heel /Toe x20 with cuing for posture Hip abduction 2x10     PATIENT EDUCATION:  Education details: HEP Person educated: Patient Education method: Programmer, multimedia, Demonstration, Actor cues, Verbal cues, and Handouts Education comprehension: verbalized understanding, returned demonstration, verbal cues required, tactile cues required, and needs further education   HOME EXERCISE PROGRAM: Access Code: [email or web address] URL: [email or web address] Date: 01/08/2023 Prepared by: Lorayne Bender  Exercises - Seated Hip Abduction with Resistance  - 1 x daily - 7 x weekly - 3 sets - 10 reps - Seated Knee Extension with Resistance  - 1 x daily - 7 x weekly - 3 sets - 10 reps - Shoulder extension with resistance - Neutral  - 1  x daily - 7 x weekly - 3 sets - 10 reps - Scapular Retraction with Resistance  - 1 x daily - 7 x weekly - 3 sets - 10 rep   ASSESSMENT:   CLINICAL IMPRESSION:  The patient continues to make progress. We continue to advance standing exercises. She was advised to work on them at home. She had no increase in pain today just fatigue.   OBJECTIVE IMPAIRMENTS: decreased activity tolerance, decreased endurance, and decreased strength.    ACTIVITY LIMITATIONS: sitting, standing, sleeping, and locomotion level   PARTICIPATION LIMITATIONS: cleaning, laundry, shopping, and community activity   PERSONAL FACTORS: 1 comorbidity: morbid obesity are also affecting patient's functional outcome.    REHAB POTENTIAL: Excellent   CLINICAL DECISION MAKING: Stable/uncomplicated   EVALUATION COMPLEXITY: Low     GOALS: Goals reviewed with patient? Yes   SHORT TERM GOALS: Target date: 01/21/2023 Patient will increase left LE strength by 5 lbs.  Baseline: Goal status: INITIAL   2.  Patient will be independent with HEP Baseline:  Goal status: INITIAL   3.  Patient will be able to stand for 20 minutes without increased low back pain  Baseline:  Goal status: INITIAL   LONG TERM GOALS: Target date: 02/19/2023     Patient will be able to walk community distances without increased low back pain  Baseline:  Goal status: INITIAL   2.  Patient will be independent with complete exercise program in order to return to the gym Baseline:  Goal status: INITIAL   3.  Patient will be able to dance without  pain.  Baseline:  Goal status: INITIAL       PLAN:   PT FREQUENCY: 1-2x/week   PT DURATION: 8 weeks   PLANNED INTERVENTIONS: Therapeutic exercises, Therapeutic activity, Neuromuscular re-education, Balance training, Gait training, Patient/Family education, Self Care, Joint mobilization, Joint manipulation, Aquatic Therapy, Dry Needling, Cryotherapy, Moist heat, Ionotophoresis 4mg /ml Dexamethasone,  and Manual therapy   PLAN FOR NEXT SESSION: Review HEP and modify as needed. Consider LAQ and 3 way hip . Progress to  gym exercises as tolerated    Dessie Coma, PT  03/18/2023

## 2023-03-18 ENCOUNTER — Encounter (HOSPITAL_BASED_OUTPATIENT_CLINIC_OR_DEPARTMENT_OTHER): Payer: Self-pay | Admitting: Physical Therapy

## 2023-03-27 ENCOUNTER — Encounter (HOSPITAL_BASED_OUTPATIENT_CLINIC_OR_DEPARTMENT_OTHER): Payer: Self-pay | Admitting: Physical Therapy

## 2023-03-27 ENCOUNTER — Ambulatory Visit (HOSPITAL_BASED_OUTPATIENT_CLINIC_OR_DEPARTMENT_OTHER): Payer: 59 | Admitting: Physical Therapy

## 2023-03-27 DIAGNOSIS — M5459 Other low back pain: Secondary | ICD-10-CM

## 2023-03-27 DIAGNOSIS — M62830 Muscle spasm of back: Secondary | ICD-10-CM

## 2023-03-27 NOTE — Therapy (Signed)
Patient Name: Betty Shelton MRN: [id number] DOB:[birth year], 37 y.o., female Today's Date: 01/07/2023   END OF SESSION:   PT End of Session - 03/27/23 1628     Visit Number 6    Number of Visits 12    Date for PT Re-Evaluation 04/15/23    PT Start Time 1612    PT Stop Time 1650    PT Time Calculation (min) 38 min    Activity Tolerance Patient tolerated treatment well    Behavior During Therapy [facility] for tasks assessed/performed                     Past Medical History:  Diagnosis Date   Allergy     Back pain      back pain began 05/2022   Medical history non-contributory     Morbid obesity with BMI of 50.0-59.9, adult [facility])           Past Surgical History:  Procedure Laterality Date   CESAREAN SECTION       CESAREAN SECTION N/A 08/16/2015    Procedure: CESAREAN SECTION;  Surgeon: Tilda Burrow, MD;  Location: [facility];  Service: Obstetrics;  Laterality: N/A;   IUD REMOVAL N/A 11/28/2013    Procedure: INTRAUTERINE DEVICE (IUD) REMOVAL;  Surgeon: Allie Bossier, MD;  Location: [facility];  Service: Gynecology;  Laterality: N/A;   TOOTH EXTRACTION   2014   WISDOM TOOTH EXTRACTION            Patient Active Problem List    Diagnosis Date Noted   Left hip pain 07/04/2022   Elevated BP without diagnosis of hypertension 07/01/2021   Otitis externa of right ear 06/21/2021   Hemorrhagic ovarian cyst 03/28/2021   Encounter to establish care 03/28/2021   Hidradenitis suppurativa 03/28/2021   Anxiety and depression 03/28/2021   Depression 07/23/2017   Women's annual routine gynecological examination 07/23/2017   Smoker 02/28/2016      PCP: Raymond de [place], MD   REFERRING PROVIDER: Huel Cote, MD   REFERRING DIAG:    Diagnosis  M54.16 (ICD-10-CM) - Radiculopathy, lumbar region     THERAPY DIAG:  Other low  back pain  Muscle spasm    Rationale for Evaluation and Treatment: Rehabilitation   ONSET DATE: 11/17/2022 microdiskectomy    SUBJECTIVE:    SUBJECTIVE  STATEMENT: The patient reports the pain down her leg has resolved. Overall she is doing well.   Eval: Patient is a 37 y/o female who presents a history of lower back pain . Patient started having back pain in August 2023. Patient performed trial of  physical therapy but had an acute flair up of her lower back pain . She was found to have an L5 nerve route impingement. She underwent a microdiscectomy on 11/17/2022. At this time her pain is well controlled. She is ambulating without a devic. She feels like her endurance is off compared to prior to her onset of pain.    PERTINENT HISTORY: Depression, anxiety, back pain  PAIN:  Are you having pain? Yes: NPRS scale: 0/10 right now. T Pain location: Left side of the low back; twinges on the right  Pain description: aching  Aggravating factors: standing and walking  Relieving factors: rest    PRECAUTIONS: Back micro diskectomy lifting 15 lbs    WEIGHT BEARING RESTRICTIONS: No   FALLS:  Has patient fallen in last 6 months? No   LIVING ENVIRONMENT: 3 steps into the house  OCCUPATION:  Works  at an office. Sits all day. Was given a new chair for when she returns.    Hobbies: back to dancing and playing with her kids    PLOF: Independent   PATIENT GOALS:  To improve endurance    NEXT MD VISIT:  Nothing scheduled    OBJECTIVE:    DIAGNOSTIC FINDINGS:  Nothing post op    PATIENT SURVEYS:  FOTO     COGNITION: Overall cognitive status: Within functional limits for tasks assessed                         SENSATION: Pain down the lateral portion of her leg      POSTURE: rounded shoulders   PALPATION:     LOWER EXTREMITY ROM:   LUMBAR ROM:    Active  A/PROM  eval  Flexion  mild pain at end range   Extension  not tested   Right lateral flexion Mild pain at end range    Left lateral flexion Mild pain at end range    Right rotation    Left rotation     (Blank rows = not tested)    Passive ROM Right eval Left eval  Hip  flexion      Hip extension      Hip abduction      Hip adduction      Hip internal rotation      Hip external rotation      Knee flexion      Knee extension      Ankle dorsiflexion      Ankle plantarflexion      Ankle inversion      Ankle eversion       (Blank rows = not tested) not tested today 2nd to time   LOWER EXTREMITY MMT:   MMT Right eval Left eval Right 5/21 Left 5/21  Hip flexion 25 21.2 50.1 37.1  Hip extension        Hip abduction 30.3 26.4 37.6 38.3  Hip adduction        Hip internal rotation        Hip external rotation        Knee flexion        Knee extension 31 30.4 39.0 39.7  Ankle dorsiflexion        Ankle plantarflexion        Ankle inversion        Ankle eversion         (Blank rows = not tested)   LOWER EXTREMITY SPECIAL TESTS:     FUNCTIONAL TESTS:     GAIT: Decreased hip flexion bilateral    TODAY'S TREATMENT:                                                                                                                              DATE:   6/14 Nu-step 5 min   Ball roll 10x each  way.  Newman Pies press down x20 with 5 sec hold   LAQ 2x10 bilateral with cuing for posture   Cable row 2x15 10 lbs  Cable extension 2x15 10lbs   Bilateral ER 2x15 red   Hip abduction 2x15 Green  Knee extension 2x15 Green    6/5 Nu-step 5 min   Ball roll 10x each way.  Ball press down x20 with 5 sec hold   LAQ 2x10 bilateral with cuing for posture   Hip abduction 3x15 red   Heel /Toe x20 with cuing for posture Hip abduction 2x10     PATIENT EDUCATION:  Education details: HEP Person educated: Patient Education method: Programmer, multimedia, Demonstration, Actor cues, Verbal cues, and Handouts Education comprehension: verbalized understanding, returned demonstration, verbal cues required, tactile cues required, and needs further education   HOME EXERCISE PROGRAM: Access Code: [email or web address] URL: [email or web address] Date:  01/08/2023 Prepared by: Lorayne Bender  Exercises - Seated Hip Abduction with Resistance  - 1 x daily - 7 x weekly - 3 sets - 10 reps - Seated Knee Extension with Resistance  - 1 x daily - 7 x weekly - 3 sets - 10 reps - Shoulder extension with resistance - Neutral  - 1 x daily - 7 x weekly - 3 sets - 10 reps - Scapular Retraction with Resistance  - 1 x daily - 7 x weekly - 3 sets - 10 rep   ASSESSMENT:   CLINICAL IMPRESSION:  Therapy advanced the patient to light gym activity. She tolerated well. She had some fatigue but no significant pain. Therapy will cont  OBJECTIVE IMPAIRMENTS: decreased activity tolerance, decreased endurance, and decreased strength.    ACTIVITY LIMITATIONS: sitting, standing, sleeping, and locomotion level   PARTICIPATION LIMITATIONS: cleaning, laundry, shopping, and community activity   PERSONAL FACTORS: 1 comorbidity: morbid obesity are also affecting patient's functional outcome.    REHAB POTENTIAL: Excellent   CLINICAL DECISION MAKING: Stable/uncomplicated   EVALUATION COMPLEXITY: Low     GOALS: Goals reviewed with patient? Yes   SHORT TERM GOALS: Target date: 01/21/2023 Patient will increase left LE strength by 5 lbs.  Baseline: Goal status: INITIAL   2.  Patient will be independent with HEP Baseline:  Goal status: INITIAL   3.  Patient will be able to stand for 20 minutes without increased low back pain  Baseline:  Goal status: INITIAL   LONG TERM GOALS: Target date: 02/19/2023     Patient will be able to walk community distances without increased low back pain  Baseline:  Goal status: INITIAL   2.  Patient will be independent with complete exercise program in order to return to the gym Baseline:  Goal status: INITIAL   3.  Patient will be able to dance without  pain.  Baseline:  Goal status: INITIAL       PLAN:   PT FREQUENCY: 1-2x/week   PT DURATION: 8 weeks   PLANNED INTERVENTIONS: Therapeutic exercises, Therapeutic  activity, Neuromuscular re-education, Balance training, Gait training, Patient/Family education, Self Care, Joint mobilization, Joint manipulation, Aquatic Therapy, Dry Needling, Cryotherapy, Moist heat, Ionotophoresis 4mg /ml Dexamethasone, and Manual therapy   PLAN FOR NEXT SESSION: Review HEP and modify as needed. Consider LAQ and 3 way hip . Progress to  gym exercises as tolerated    Dessie Coma, PT  03/27/2023

## 2023-04-03 ENCOUNTER — Ambulatory Visit (HOSPITAL_BASED_OUTPATIENT_CLINIC_OR_DEPARTMENT_OTHER): Payer: 59 | Admitting: Physical Therapy

## 2023-04-03 DIAGNOSIS — M5459 Other low back pain: Secondary | ICD-10-CM

## 2023-04-04 NOTE — Therapy (Signed)
The patient entered the clinic with severe pain between her shoulder blades. She reports it has been making her dizzy at night. She has pain when she takes deep breathes. We attempted light stretching to see if we could reduce the pain but it made her more SOB. She was advised to go to an urgent care or the ED> She reported her MD was upstairs she would stop up with them and see what they suggest.

## 2023-04-22 ENCOUNTER — Encounter (HOSPITAL_BASED_OUTPATIENT_CLINIC_OR_DEPARTMENT_OTHER): Payer: Self-pay | Admitting: Family Medicine

## 2023-04-22 ENCOUNTER — Other Ambulatory Visit (HOSPITAL_BASED_OUTPATIENT_CLINIC_OR_DEPARTMENT_OTHER): Payer: Self-pay

## 2023-04-22 DIAGNOSIS — L732 Hidradenitis suppurativa: Secondary | ICD-10-CM

## 2023-04-22 DIAGNOSIS — N83209 Unspecified ovarian cyst, unspecified side: Secondary | ICD-10-CM

## 2023-04-22 NOTE — Progress Notes (Signed)
Patient requesting Endo referral, unable to find provider Dorice Lamas) or office ([facility]) she is referring to. Will send her a message to possibly get different information to submit referral.

## 2023-12-09 ENCOUNTER — Ambulatory Visit (HOSPITAL_BASED_OUTPATIENT_CLINIC_OR_DEPARTMENT_OTHER): Payer: Self-pay | Admitting: Family Medicine

## 2023-12-09 NOTE — Telephone Encounter (Signed)
 Copied from CRM [id number]. Topic: Clinical - Red Word Triage >> Dec 09, 2023  8:46 AM Antwanette L wrote: Red Word that prompted transfer to Nurse Triage: Back  pain and numbness in both legs.   Chief Complaint: Back pain Symptoms: Back pain, Numbness in legs and toes, spine making popping sounds Frequency: Back pain ongoing for a year Disposition: [] ED /[] Urgent Care (no appt availability in office) / [x] Appointment(In office/virtual)/ []  [facility]/ [] Home Care/ [] Refused Recommended Disposition /[] [facility]/ []  Follow-up with PCP Additional Notes: Patient stated she had surgery a year ago due to herniated disc in her back. She feels like surgery helped a bit but she still continues to have constant back pain. Her pain level is 2/10 now. She also has been having numbness and pain in her legs anytime she stands for longer than 20 minutes. Patient stated she is tired of dealing with the chronic pain. Appointment scheduled for tomorrow.   Reason for Disposition  Back pain is a chronic symptom (recurrent or ongoing AND present > 4 weeks)  Answer Assessment - Initial Assessment Questions 1. ONSET: "When did the pain begin?"      1 year ago  2. LOCATION: "Where does it hurt?" (upper, mid or lower back)     Lower back, in area where she had an epidural in the past  3. SEVERITY: "How bad is the pain?"  (e.g., Scale 1-10; mild, moderate, or severe)   - MILD (1-3): Doesn't interfere with normal activities.    - MODERATE (4-7): Interferes with normal activities or awakens from sleep.    - SEVERE (8-10): Excruciating pain, unable to do any normal activities.      2/10   4. PATTERN: "Is the pain constant?" (e.g., yes, no; constant, intermittent)      Constant  5. RADIATION: "Does the pain shoot into your legs or somewhere else?"     Goes into legs  6. CAUSE:  "What do you think is causing the back pain?"     Patient had a herniated disc. She had surgery for this last  year but the pain has not resolved.  7. NEUROLOGIC SYMPTOMS: "Do you have any weakness, numbness, or problems with bowel/bladder control?"     Leg numbness, burning pain in back of legs, difficult to stand for long periods of time (has been ongoing for several months)  Protocols used: Back Pain-A-AH

## 2023-12-09 NOTE — Telephone Encounter (Signed)
 LVM for patient to call the office to let her know can keep appt but would need to go ahead and reach out to spine specialist if she has one

## 2023-12-10 ENCOUNTER — Encounter (HOSPITAL_BASED_OUTPATIENT_CLINIC_OR_DEPARTMENT_OTHER): Payer: Self-pay | Admitting: Family Medicine

## 2023-12-10 ENCOUNTER — Ambulatory Visit (INDEPENDENT_AMBULATORY_CARE_PROVIDER_SITE_OTHER): Payer: 59 | Admitting: Family Medicine

## 2023-12-10 VITALS — BP 138/86 | HR 91 | Ht 65.0 in | Wt 350.7 lb

## 2023-12-10 DIAGNOSIS — Z0000 Encounter for general adult medical examination without abnormal findings: Secondary | ICD-10-CM

## 2023-12-10 DIAGNOSIS — M5416 Radiculopathy, lumbar region: Secondary | ICD-10-CM | POA: Insufficient documentation

## 2023-12-10 NOTE — Patient Instructions (Signed)
  Medication Instructions:  Your physician recommends that you continue on your current medications as directed. Please refer to the Current Medication list given to you today. --If you need a refill on any your medications before your next appointment, please call your pharmacy first. If no refills are authorized on file call the office.-- Lab Work: Your physician has recommended that you have lab work today: no If you have labs (blood work) drawn today and your tests are completely normal, you will receive your results via MyChart message OR a phone call from our staff.  Please ensure you check your voicemail in the event that you authorized detailed messages to be left on a delegated number. If you have any lab test that is abnormal or we need to change your treatment, we will call you to review the results.  Referrals/Procedures/Imaging: no  Follow-Up: Your next appointment:   Your physician recommends that you schedule a follow-up appointment in: 6 months with Dr. de Peru  You will receive a text message or e-mail with a link to a survey about your care and experience with [place] today! We would greatly appreciate your feedback!   Thanks for letting us be apart of your health journey!!  Primary Care and Sports Medicine   Dr. Ceasar Mons Peru   We encourage you to activate your patient portal called "MyChart".  Sign up information is provided on this After Visit Summary.  MyChart is used to connect with patients for Virtual Visits (Telemedicine).  Patients are able to view lab/test results, encounter notes, upcoming appointments, etc.  Non-urgent messages can be sent to your provider as well. To learn more about what you can do with MyChart, please visit --  [email or web address].

## 2023-12-10 NOTE — Assessment & Plan Note (Signed)
 Patient reports that she has been having increasing symptoms of low back pain with radiation into left lower extremity.  She has had associated numbness in left leg which feels as though her leg has fallen asleep, however she will try to move her leg and feeling abdominal this will persist.  She has had similar symptoms in the past for which she has met with various orthopedic surgeons and spine specialists.  She has received prior injections into the back as well as having surgery in the past.  Surgery was with Dr. Marikay Alar through [facility].  Surgery was about 1 year ago.  She indicates that she did have some improvement in symptoms, however symptoms never completely resolved following surgery.  She did have some follow-up with surgeon, however did not complete 90-day follow-up with specialist. Discussed recommendations today.  Given history of prior injections as well as surgery, would recommend that she follow-up with her surgeon to review her symptoms, lack of complete resolution.  Discussed that they may feel it would be warranted to proceed with updated imaging to have further structural assessment given current symptoms.  Patient is in agreement.  She will reach out to spine surgeons office in order to schedule appointment

## 2023-12-10 NOTE — Progress Notes (Signed)
    Procedures performed today:    None.  Independent interpretation of notes and tests performed by another provider:   None.  Brief History, Exam, Impression, and Recommendations:    BP 138/86 (BP Location: Right Arm, Patient Position: Sitting, Cuff Size: Normal)   Pulse 91   Ht 5\' 5"  (1.651 m)   Wt (!) 350 lb 11.2 oz (159.1 kg)   SpO2 96%   BMI 58.36 kg/m   Patient is overdue for follow-up.  She was last seen in our office about 1.5 years ago by [place] who no longer practices in our office.  Lumbar radiculopathy Assessment & Plan: Patient reports that she has been having increasing symptoms of low back pain with radiation into left lower extremity.  She has had associated numbness in left leg which feels as though her leg has fallen asleep, however she will try to move her leg and feeling abdominal this will persist.  She has had similar symptoms in the past for which she has met with various orthopedic surgeons and spine specialists.  She has received prior injections into the back as well as having surgery in the past.  Surgery was with Dr. Marikay Alar through [facility].  Surgery was about 1 year ago.  She indicates that she did have some improvement in symptoms, however symptoms never completely resolved following surgery.  She did have some follow-up with surgeon, however did not complete 90-day follow-up with specialist. Discussed recommendations today.  Given history of prior injections as well as surgery, would recommend that she follow-up with her surgeon to review her symptoms, lack of complete resolution.  Discussed that they may feel it would be warranted to proceed with updated imaging to have further structural assessment given current symptoms.  Patient is in agreement.  She will reach out to spine surgeons office in order to schedule appointment   Overdue for routine follow-up.  We can arrange for CPE to be completed with labs about 1  week prior at patient's preference in regards to timing.  Can consider arranging for this in the next 4 to 6 months.  Lab orders have been placed for patient to complete prior to physical   ___________________________________________ Ezequias Lard de [place], MD, ABFM, CAQSM Primary Care and Sports Medicine [facility]

## 2024-01-05 ENCOUNTER — Other Ambulatory Visit: Payer: Self-pay | Admitting: Student

## 2024-01-05 DIAGNOSIS — M5416 Radiculopathy, lumbar region: Secondary | ICD-10-CM

## 2024-01-06 ENCOUNTER — Ambulatory Visit
Admission: RE | Admit: 2024-01-06 | Discharge: 2024-01-06 | Disposition: A | Discharge status: Home / Self Care | Source: Ambulatory Visit | Attending: Student | Admitting: Student

## 2024-01-06 DIAGNOSIS — M5416 Radiculopathy, lumbar region: Secondary | ICD-10-CM

## 2024-01-07 ENCOUNTER — Encounter: Payer: Self-pay | Admitting: Student

## 2024-01-08 ENCOUNTER — Inpatient Hospital Stay
Admission: RE | Admit: 2024-01-08 | Discharge: 2024-01-08 | Disposition: A | Discharge status: Home / Self Care | Source: Ambulatory Visit | Attending: Family Medicine | Admitting: Family Medicine

## 2024-01-08 MED ORDER — GADOPICLENOL 0.5 MMOL/ML IV SOLN
10.0000 mL | Freq: Once | INTRAVENOUS | Status: AC | PRN
  Administered 2024-01-08: 10 mL via INTRAVENOUS

## 2024-02-15 ENCOUNTER — Ambulatory Visit: Payer: Self-pay

## 2024-02-15 ENCOUNTER — Encounter (HOSPITAL_BASED_OUTPATIENT_CLINIC_OR_DEPARTMENT_OTHER): Payer: Self-pay

## 2024-02-15 ENCOUNTER — Emergency Department (HOSPITAL_BASED_OUTPATIENT_CLINIC_OR_DEPARTMENT_OTHER): Admission: EM | Admit: 2024-02-15 | Discharge: 2024-02-15 | Disposition: A | Discharge status: Home / Self Care

## 2024-02-15 ENCOUNTER — Emergency Department (HOSPITAL_BASED_OUTPATIENT_CLINIC_OR_DEPARTMENT_OTHER)

## 2024-02-15 ENCOUNTER — Emergency Department (HOSPITAL_COMMUNITY)

## 2024-02-15 ENCOUNTER — Other Ambulatory Visit: Payer: Self-pay

## 2024-02-15 DIAGNOSIS — E669 Obesity, unspecified: Secondary | ICD-10-CM | POA: Diagnosis not present

## 2024-02-15 DIAGNOSIS — G43909 Migraine, unspecified, not intractable, without status migrainosus: Secondary | ICD-10-CM | POA: Diagnosis not present

## 2024-02-15 DIAGNOSIS — R200 Anesthesia of skin: Secondary | ICD-10-CM | POA: Diagnosis present

## 2024-02-15 LAB — DIFFERENTIAL
Abs Immature Granulocytes: 0.03 10*3/uL (ref 0.00–0.07)
Basophils Absolute: 0.1 10*3/uL (ref 0.0–0.1)
Basophils Relative: 1 %
Eosinophils Absolute: 0.3 10*3/uL (ref 0.0–0.5)
Eosinophils Relative: 3 %
Immature Granulocytes: 0 %
Lymphocytes Relative: 28 %
Lymphs Abs: 2.4 10*3/uL (ref 0.7–4.0)
Monocytes Absolute: 0.3 10*3/uL (ref 0.1–1.0)
Monocytes Relative: 4 %
Neutro Abs: 5.6 10*3/uL (ref 1.7–7.7)
Neutrophils Relative %: 64 %

## 2024-02-15 LAB — CBC
HCT: 34.9 % — ABNORMAL LOW (ref 36.0–46.0)
Hemoglobin: 10.9 g/dL — ABNORMAL LOW (ref 12.0–15.0)
MCH: 25.6 pg — ABNORMAL LOW (ref 26.0–34.0)
MCHC: 31.2 g/dL (ref 30.0–36.0)
MCV: 82.1 fL (ref 80.0–100.0)
Platelets: 288 10*3/uL (ref 150–400)
RBC: 4.25 MIL/uL (ref 3.87–5.11)
RDW: 14.6 % (ref 11.5–15.5)
WBC: 8.7 10*3/uL (ref 4.0–10.5)
nRBC: 0 % (ref 0.0–0.2)

## 2024-02-15 LAB — PREGNANCY, URINE: Preg Test, Ur: NEGATIVE

## 2024-02-15 LAB — URINE DRUG SCREEN
Amphetamines: NOT DETECTED
Barbiturates: NOT DETECTED
Benzodiazepines: NOT DETECTED
Cocaine: NOT DETECTED
Fentanyl: NOT DETECTED
Methadone Scn, Ur: NOT DETECTED
Opiates: NOT DETECTED
Tetrahydrocannabinol: NOT DETECTED

## 2024-02-15 LAB — COMPREHENSIVE METABOLIC PANEL WITH GFR
ALT: 11 U/L (ref 0–44)
AST: 22 U/L (ref 15–41)
Albumin: 4.2 g/dL (ref 3.5–5.0)
Alkaline Phosphatase: 70 U/L (ref 38–126)
Anion gap: 10 (ref 5–15)
BUN: 11 mg/dL (ref 6–20)
CO2: 24 mmol/L (ref 22–32)
Calcium: 9.2 mg/dL (ref 8.9–10.3)
Chloride: 101 mmol/L (ref 98–111)
Creatinine, Ser: 0.74 mg/dL (ref 0.44–1.00)
GFR, Estimated: >60 mL/min (ref >60)
Glucose, Bld: 138 mg/dL — ABNORMAL HIGH (ref 70–99)
Potassium: 4.3 mmol/L (ref 3.5–5.1)
Sodium: 135 mmol/L (ref 135–145)
Total Bilirubin: 0.3 mg/dL (ref 0.0–1.2)
Total Protein: 7 g/dL (ref 6.5–8.1)

## 2024-02-15 LAB — APTT: aPTT: 30 s (ref 24–36)

## 2024-02-15 LAB — PROTIME-INR
INR: 0.9 (ref 0.8–1.2)
Prothrombin Time: 12 s (ref 11.4–15.2)

## 2024-02-15 LAB — ETHANOL: Alcohol, Ethyl (B): <15 mg/dL (ref <15)

## 2024-02-15 LAB — TROPONIN T, HIGH SENSITIVITY: Troponin T High Sensitivity: <15 ng/L (ref <19)

## 2024-02-15 MED ORDER — DIPHENHYDRAMINE HCL 50 MG/ML IJ SOLN
12.5000 mg | Freq: Once | INTRAMUSCULAR | Status: AC
  Administered 2024-02-15: 12.5 mg via INTRAVENOUS
  Filled 2024-02-15: qty 1

## 2024-02-15 MED ORDER — PROCHLORPERAZINE EDISYLATE 10 MG/2ML IJ SOLN
10.0000 mg | Freq: Once | INTRAMUSCULAR | Status: AC
  Administered 2024-02-15: 10 mg via INTRAVENOUS
  Filled 2024-02-15: qty 2

## 2024-02-15 MED ORDER — KETOROLAC TROMETHAMINE 15 MG/ML IJ SOLN
15.0000 mg | Freq: Once | INTRAMUSCULAR | Status: AC
  Administered 2024-02-15: 15 mg via INTRAVENOUS
  Filled 2024-02-15 (×2): qty 1

## 2024-02-15 MED ORDER — ACETAMINOPHEN 500 MG PO TABS
1000.0000 mg | ORAL_TABLET | Freq: Once | ORAL | Status: AC
  Administered 2024-02-15: 1000 mg via ORAL
  Filled 2024-02-15: qty 2

## 2024-02-15 MED ORDER — GADOBUTROL 1 MMOL/ML IV SOLN
10.0000 mL | Freq: Once | INTRAVENOUS | Status: AC | PRN
  Administered 2024-02-15: 10 mL via INTRAVENOUS

## 2024-02-15 NOTE — ED Notes (Signed)
 Patient transported to MRI

## 2024-02-15 NOTE — ED Notes (Signed)
 Second trop due 1315.

## 2024-02-15 NOTE — ED Notes (Signed)
 Pt resting on stomach with blanket over head. Significant other at bedside. No needs at this time.

## 2024-02-15 NOTE — ED Provider Notes (Signed)
 Accepted as ED to ED transfer -- new numbness of left face, arm, and left leg.   Briefly: Patient is 38 y.o.   DDX: concern for no resolution after migraine cocktail, unremarkable CT head.  Sent for MR brain.  Plan: MR brain pending, handed off to oncoming provider, resident physician, and Dr. Zavitz for follow up.      Stamatia Masri H, PA-C 02/15/24 1419    Betty Arias, MD 02/15/24 1521

## 2024-02-15 NOTE — ED Notes (Signed)
 Pt aware of the need for a urine... Unable to currently provide the sample.Betty KitchenMarland Shelton

## 2024-02-15 NOTE — Discharge Instructions (Addendum)
 Betty Shelton:  Thank you for allowing us  to take care of you today.  We hope you begin feeling better soon. You were seen today for L sided numbness. MRI was negative.   To-Do:  Please follow-up with your primary doctor within the next 2-3 days. It is important that you review any labs or imaging results (if any) that you had today with them. Your preliminary imaging results (if any) are attached. Please return to the Emergency Department or call 911 if you experience chest pain, shortness of breath, severe pain, severe fever, altered mental status, or have any reason to think that you need emergency medical care.  Thank you again.  Hope you feel better soon.  Arminda Landmark, MD Department of Emergency Medicine

## 2024-02-15 NOTE — Telephone Encounter (Signed)
 Copied from CRM [id number]. Topic: Clinical - Red Word Triage >> Feb 15, 2024  8:08 AM Everette C wrote: [facility] that prompted transfer to Nurse Triage: The patient is experiencing left side facial numbness since yesterday 02/14/24, the patient has on ongoing history of left side numbness but it has never reached their face before   The patient is also having sharp pains on their right side near their chest   Please contact when possible    Chief Complaint: Numbness to left arm, leg yesterday. Today left side of face is numb. Symptoms: Above Frequency: Yesterday and today Pertinent Negatives: Patient denies facial drooping Disposition: [x] ED /[] Urgent Care (no appt availability in office) / [] Appointment(In office/virtual)/ []  [facility]/ [] Home Care/ [] Refused Recommended Disposition /[] [place]/ []  Follow-up with PCP Additional Notes: Instructed to call, 911. Will boyfriend take her.  Reason for Disposition  [1] Numbness (i.e., loss of sensation) of the face, arm / hand, or leg / foot on one side of the body AND [2] sudden onset AND [3] present now  Answer Assessment - Initial Assessment Questions 1. SYMPTOM: "What is the main symptom you are concerned about?" (e.g., weakness, numbness)     Numbness to face, arm, leg 2. ONSET: "When did this start?" (minutes, hours, days; while sleeping)     Yesterday 3. LAST NORMAL: "When was the last time you (the patient) were normal (no symptoms)?"     Last week 4. PATTERN "Does this come and go, or has it been constant since it started?"  "Is it present now?"     Constant 5. CARDIAC SYMPTOMS: "Have you had any of the following symptoms: chest pain, difficulty breathing, palpitations?"     No 6. NEUROLOGIC SYMPTOMS: "Have you had any of the following symptoms: headache, dizziness, vision loss, double vision, changes in speech, unsteady on your feet?"     No 7. OTHER SYMPTOMS: "Do you have any other symptoms?"     Back  pain 8. PREGNANCY: "Is there any chance you are pregnant?" "When was your last menstrual period?"     no  Protocols used: Neurologic Deficit-A-AH

## 2024-02-15 NOTE — ED Notes (Signed)
 Pt currently sleeping

## 2024-02-15 NOTE — ED Notes (Signed)
 MRI contacted and stated pt will be brought back sometime after 5pm  for MRI.

## 2024-02-15 NOTE — ED Notes (Signed)
 Toradol  held due to no Preg result... Provider and Pt aware.

## 2024-02-15 NOTE — ED Notes (Signed)
 Patient asking to step outside to smoke, this RN informed patient that she should wait in ED for MRI results to come back. Patient agreeable to plan.

## 2024-02-15 NOTE — ED Notes (Signed)
 Pt understood no drinking/driving on the way to [facility] [place]... Family stated that they would drive... Pt AO x4, no difference in NIH from previous... Pt assisted to the car via wheelchair... Pt/family understood where to go and how to get to [facility]... IV secured, per MD... Pt/family understood to drive straight to [facility], no stopping for food, etc... Pt/Family understood if anything changed, to pull over and call 911.Aaron AasAaron Aas

## 2024-02-15 NOTE — ED Provider Notes (Signed)
 [place] EMERGENCY DEPARTMENT AT [facility] Provider Note   CSN: [id number] Arrival date & time: 02/15/24  4098     History  Chief Complaint  Patient presents with   Numbness    Betty Shelton is a 38 y.o. female.  This is a 38 year old female presenting emergency department for left face numbness and left upper and lower extremity numbness.  Went to bed around 3 AM with no symptoms, awoke this morning with feeling her whole left face is numb as well as her left upper and lower extremity.  Denies headache, vision changes, facial droop, aphasia, dysarthria, weakness.  No recent URI type symptoms.  She notes that she has had some intermittent tingling sensation to her left hand and left foot over the past several weeks, but they have not persisted and not to the extent today.        Home Medications Prior to Admission medications   Medication Sig Start Date End Date Taking? Authorizing Provider  meloxicam  (MOBIC ) 7.5 MG tablet Take 15 mg by mouth daily.   Yes [provider]  traMADol  (ULTRAM ) 50 MG tablet Take 1 tablet (50 mg total) by mouth every 6 (six) hours as needed. 11/10/22  Yes Wilhelmenia Harada, MD  methocarbamol  (ROBAXIN ) 500 MG tablet Take 1 tablet (500 mg total) by mouth 2 (two) times daily. 10/04/22   Almond Army, MD      Allergies    Mucinex d [pseudoephedrine-guaifenesin], Guaifenesin, and Mucinex [guaifenesin er]    Review of Systems   Review of Systems  Physical Exam Updated Vital Signs BP 118/84   Pulse 70   Temp 98 F (36.7 C) (Oral)   Resp 15   Ht 5\' 5"  (1.651 m)   Wt (!) 162.4 kg   LMP 02/04/2024 (Exact Date)   SpO2 97%   BMI 59.57 kg/m  Physical Exam Vitals and nursing note reviewed.  Constitutional:      General: She is not in acute distress.    Appearance: She is obese. She is not toxic-appearing.  HENT:     Nose: Nose normal.     Mouth/Throat:     Mouth: Mucous membranes are dry.  Eyes:     Conjunctiva/sclera:  Conjunctivae normal.  Cardiovascular:     Rate and Rhythm: Normal rate and regular rhythm.  Pulmonary:     Effort: Pulmonary effort is normal.     Breath sounds: Normal breath sounds.  Abdominal:     General: Abdomen is flat. There is no distension.     Tenderness: There is no abdominal tenderness. There is no guarding or rebound.  Musculoskeletal:        General: Normal range of motion.  Skin:    General: Skin is warm and dry.     Capillary Refill: Capillary refill takes less than 2 seconds.  Neurological:     Mental Status: She is alert and oriented to person, place, and time.     Comments: Patient with subjective decreased sensation to the entire left side of her face as well as her left upper and lower extremity.  Cranial nerves otherwise intact.  No motor deficits.  Coordinated movements in all extremities.  No pronator drift.  Psychiatric:        Mood and Affect: Mood normal.        Behavior: Behavior normal.     ED Results / Procedures / Treatments   Labs (all labs ordered are listed, but only abnormal results are displayed) Labs Reviewed  CBC - Abnormal; Notable for the following components:      Result Value   Hemoglobin 10.9 (*)    HCT 34.9 (*)    MCH 25.6 (*)    All other components within normal limits  COMPREHENSIVE METABOLIC PANEL WITH GFR - Abnormal; Notable for the following components:   Glucose, Bld 138 (*)    All other components within normal limits  ETHANOL  PROTIME-INR  APTT  DIFFERENTIAL  URINE DRUG SCREEN  PREGNANCY, URINE  TROPONIN T, HIGH SENSITIVITY  TROPONIN T, HIGH SENSITIVITY    EKG None  Radiology DG Chest Portable 1 View Result Date: 02/15/2024 CLINICAL DATA:  Chest pain. EXAM: PORTABLE CHEST 1 VIEW COMPARISON:  01/15/2007. FINDINGS: The heart size and mediastinal contours are within normal limits. No focal consolidation, pleural effusion, or pneumothorax. No acute osseous abnormality. IMPRESSION: No acute cardiopulmonary findings.  Electronically Signed   By: Mannie Seek M.D.   On: 02/15/2024 10:31   CT HEAD WO CONTRAST Result Date: 02/15/2024 CLINICAL DATA:  Neuro deficit, concern for stroke. Evaluation of numbness on the left side of face, left arm and left leg. EXAM: CT HEAD WITHOUT CONTRAST TECHNIQUE: Contiguous axial images were obtained from the base of the skull through the vertex without intravenous contrast. RADIATION DOSE REDUCTION: This exam was performed according to the departmental dose-optimization program which includes automated exposure control, adjustment of the mA and/or kV according to patient size and/or use of iterative reconstruction technique. COMPARISON:  None Available. FINDINGS: Brain: No acute intracranial hemorrhage. No CT evidence of acute infarct. No edema, mass effect, or midline shift. The basilar cisterns are patent. Ventricles: The ventricles are normal. Vascular: No hyperdense vessel or unexpected calcification. Skull: No acute or aggressive finding. Orbits: Orbits are symmetric. Sinuses: Mild mucosal thickening in the ethmoid sinuses. Other: Mastoid air cells are clear. IMPRESSION: No CT evidence of acute intracranial abnormality. Electronically Signed   By: Denny Flack M.D.   On: 02/15/2024 10:17    Procedures Procedures    Medications Ordered in ED Medications  ketorolac  (TORADOL ) 15 MG/ML injection 15 mg (15 mg Intravenous Given 02/15/24 1151)  acetaminophen  (TYLENOL ) tablet 1,000 mg (1,000 mg Oral Given 02/15/24 1116)  prochlorperazine (COMPAZINE) injection 10 mg (10 mg Intravenous Given 02/15/24 1119)  diphenhydrAMINE  (BENADRYL ) injection 12.5 mg (12.5 mg Intravenous Given 02/15/24 1118)    ED Course/ Medical Decision Making/ A&P Clinical Course as of 02/15/24 1326  Mon Feb 15, 2024  1152 Symptoms not improved after migraine cocktail.  CT head negative.  Will reach out to neurology to discuss case in further workup options. [TY]    Clinical Course User Index [TY] Rolinda Climes,  DO                                 Medical Decision Making This is a 38 year old female with morbid obesity with chronic back pain presenting emergency department with paresthesias to the left side of her face and her upper and lower extremity.  Her last known normal would be 3 AM which is outside the window for code stroke activation.  Exam not consistent with LVO, Van negative.  CT head independently reviewed no acute hemorrhage or obvious intracranial pathology.  Broad workup also nonambulatory.  No leukocytosis that would suggest systemic infection.  Talk screen negative.  EKG appears to be normal sinus rhythm without ST segment changes to indicate ischemia.  Troponin negative.  No  electrolyte abnormalities, normal kidney function.  Trialed migraine cocktail for symptoms, but no improvement.  Discussed case with Dr. Doretta Gant recommending MRI with and without contrast.  Unfortunately we do not have MRI at [facility] today.  Discussed with Dr. Gordon Latus at [facility] who accept patient ED to ED for MRI.  Amount and/or Complexity of Data Reviewed Independent Historian:     Details: Spouse notes no dysarthria, aphasia or weakness External Data Reviewed:     Details: Has had prior MRI imaging of lumbar spine, but does not appear to have any advanced imaging of head. Labs: ordered. Decision-making details documented in ED Course. Radiology: ordered and independent interpretation performed. Decision-making details documented in ED Course.    Details: Chest x-ray without pneumonia or pneumothorax Discussion of management or test interpretation with external provider(s): Discussed with neurology  Risk OTC drugs. Prescription drug management. Diagnosis or treatment significantly limited by social determinants of health. Risk Details: Poor health literacy          Final Clinical Impression(s) / ED Diagnoses Final diagnoses:  None    Rx / DC Orders ED Discharge Orders     None         Rolinda Climes, DO 02/15/24 1326

## 2024-02-15 NOTE — ED Notes (Addendum)
 Report given to the Charge RN at [employer].Marland KitchenMarland Kitchen

## 2024-02-15 NOTE — ED Provider Notes (Signed)
 Assume Care - Medical Decision Making  Care of patient assumed from previous emergency medicine provider. See their note for further details of history, physical exam and plan.  Briefly, Betty Shelton is a 38 y.o. female who presents with  Clinical Course as of 02/15/24 1557  Mon Feb 15, 2024  1152 Symptoms not improved after migraine cocktail.  CT head negative.  Will reach out to neurology to discuss case in further workup options. [TY]  1505 S; transfer; subjective left sided numbness; neuro exam wnl; neuro involved; f/u MRI [WC]    Clinical Course User Index [TY] Rolinda Climes, DO [WC] Arminda Landmark, MD     Reassessment:  I personally reassessed the patient: Vital Signs:  The most current vitals were   Vitals:   02/15/24 1735 02/15/24 2035  BP: 123/69 121/66  Pulse: 72 70  Resp: 19 17  Temp: 98.2 F (36.8 C)   SpO2: 100% 97%    Hemodynamics:  The patient is hemodynamically stable. Mental Status:  The patient is alert  Neuro: Intact sensation to all 4 extremities Intact sensation to face bilaterally in V1-V3 5/5 strength in all 4 extremities Gait intact  Additional MDM/ED Course: MRI was performed which revealed no acute intracranial abnormalities.  Patient states that she is still having intermittent left-sided numbness and tingling.  At this time I do feel that patient does not have an acute emergent medical condition.  We discussed PCP follow-up for further testing.  We discussed the diagnosis of complex migraines.  She was agreeable with outpatient follow-up and has established PCP care.    Arminda Landmark, MD 02/15/24 1610    Clay Cummins, MD 02/16/24 [id number]

## 2024-02-15 NOTE — ED Triage Notes (Signed)
 In for eval of numbness to left side of face, left arm, and left leg. Left arm and left leg numbness for several weeks. Woke up with the facial numbness. PMH back surgeries.

## 2024-02-15 NOTE — ED Notes (Signed)
 MRI contacted and stated scan will be done for pt between 7-8p

## 2024-02-16 ENCOUNTER — Ambulatory Visit (INDEPENDENT_AMBULATORY_CARE_PROVIDER_SITE_OTHER): Admitting: Family Medicine

## 2024-02-16 ENCOUNTER — Ambulatory Visit: Payer: Self-pay

## 2024-02-16 ENCOUNTER — Encounter (HOSPITAL_BASED_OUTPATIENT_CLINIC_OR_DEPARTMENT_OTHER): Payer: Self-pay | Admitting: Family Medicine

## 2024-02-16 VITALS — BP 123/88 | HR 84 | Ht 65.0 in | Wt 353.8 lb

## 2024-02-16 DIAGNOSIS — R200 Anesthesia of skin: Secondary | ICD-10-CM | POA: Insufficient documentation

## 2024-02-16 DIAGNOSIS — R202 Paresthesia of skin: Secondary | ICD-10-CM | POA: Diagnosis not present

## 2024-02-16 NOTE — Telephone Encounter (Signed)
 Noted appt 5/6

## 2024-02-16 NOTE — Progress Notes (Signed)
    Procedures performed today:    None.  Independent interpretation of notes and tests performed by another provider:   None.  Brief History, Exam, Impression, and Recommendations:    BP 123/88 (BP Location: Right Arm, Patient Position: Sitting, Cuff Size: Large)   Pulse 84   Ht 5\' 5"  (1.651 m)   Wt (!) 353 lb 12.8 oz (160.5 kg)   LMP 02/04/2024 (Exact Date)   SpO2 99%   BMI 58.88 kg/m   Numbness and tingling Assessment & Plan: Patient presents with complaints of numbness and tingling which has been affecting left upper and lower extremity with recent involvement of left side of face.  She did have recent evaluation in the emergency department related to the symptoms.  This did include labs as well as imaging including CT head as well as MRI brain.  Evaluation was overall reassuring.  She was advised on outpatient follow-up given the emergency department.  She has not had any associated weakness, denies any dizziness or balance issues.  No vision changes.  Normal speech. On exam, patient is in no acute distress, vital signs stable.  Cardiovascular exam with regular rate and rhythm.  Extraocular movements intact, pupils are equal, round, reactive to light and accommodation.  Normal gait in office.  Normal speech, thought content. Uncertain etiology for ongoing symptoms.  She has had prior evaluation related to low back pain and radicular symptoms into lower extremity.  This prior evaluation led to recommendation by spine specialist to proceed with procedure intervention.  She is unsure about whether to proceed with this.  Discussed that with recent evaluation, there are no specific contraindications to having procedure completed.  Advised that she could discuss further with spine specialist whether they would recommend still having procedure completed to at least provide some improvement in symptoms felt to be related to specific underlying abnormality. In regards to numbness and tingling,  I do feel that further evaluation with neurology would be appropriate, patient amenable, referral placed today  Orders: -     Ambulatory referral to Neurology  Return if symptoms worsen or fail to improve.  Spent 34 minutes on this patient encounter, including preparation, chart review, face-to-face counseling with patient and coordination of care, and documentation of encounter   ___________________________________________ Jaryah Aracena de [place], MD, ABFM, [facility] Primary Care and Sports Medicine [facility]

## 2024-02-16 NOTE — Assessment & Plan Note (Signed)
 Patient presents with complaints of numbness and tingling which has been affecting left upper and lower extremity with recent involvement of left side of face.  She did have recent evaluation in the emergency department related to the symptoms.  This did include labs as well as imaging including CT head as well as MRI brain.  Evaluation was overall reassuring.  She was advised on outpatient follow-up given the emergency department.  She has not had any associated weakness, denies any dizziness or balance issues.  No vision changes.  Normal speech. On exam, patient is in no acute distress, vital signs stable.  Cardiovascular exam with regular rate and rhythm.  Extraocular movements intact, pupils are equal, round, reactive to light and accommodation.  Normal gait in office.  Normal speech, thought content. Uncertain etiology for ongoing symptoms.  She has had prior evaluation related to low back pain and radicular symptoms into lower extremity.  This prior evaluation led to recommendation by spine specialist to proceed with procedure intervention.  She is unsure about whether to proceed with this.  Discussed that with recent evaluation, there are no specific contraindications to having procedure completed.  Advised that she could discuss further with spine specialist whether they would recommend still having procedure completed to at least provide some improvement in symptoms felt to be related to specific underlying abnormality. In regards to numbness and tingling, I do feel that further evaluation with neurology would be appropriate, patient amenable, referral placed today

## 2024-02-16 NOTE — Telephone Encounter (Addendum)
 Copied from CRM [id number]. Topic: Clinical - Red Word Triage >> Feb 16, 2024  8:57 AM Betty Shelton wrote: [facility] that prompted transfer to Nurse Triage: numbness in the face.. left arm and left leg   Chief Complaint: Neurological Deficit  Symptoms: Tingling, Numbness, Visual Changes  Frequency: Acute  Pertinent Negatives: Patient denies chest pain, discomfort, dyspnea, or  Disposition: [x] ED /[] Urgent Care (no appt availability in office) / [x] Appointment(In office/virtual)/ []  [facility]/ [] Home Care/ [] Refused Recommended Disposition /[] [facility]/ []  Follow-up with PCP Additional Notes: EG is being triaged for a new onset of left sided numbness and weakness. Yesterday, the patient went to the ER due to left sided numbness and tingling where she underwent imaging. After evaluation the patient was instructed by the ED physician to follow up with her primary care provider. Established an appointment with the in office provider for this morning and recommended the patient seek a second opinion immediately for prompt evaluation and treatment. Patient verbalized understanding and agreed to recommendations and disposition.   Previous back surgery in February 2024 for a herniated disk.  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   Also complains of a pain on the right side under the shoulder. Additionally symptoms are present during telephonic encounter. Symptoms include left sided numbness, tingling on the left side of her face, left arm, and left leg. Complaints of blurred peripheral vision.   Thorough telephonic assessment prompted stern instruction and redirection for appropriation of care.   Reason for Disposition  [1] Numbness (i.e., loss of sensation) of the face, arm / hand, or leg / foot on one side of the body AND [2] sudden onset AND [3] present now  Answer Assessment - Initial Assessment Questions 1. SYMPTOM: "What is the main symptom you are  concerned about?" (e.Shelton., weakness, numbness)     Tingling, numbness  2. ONSET: "When did this start?" (minutes, hours, days; while sleeping)     Yesterday and Today  3. LAST NORMAL: "When was the last time you (the patient) were normal (no symptoms)?"     2 Days ago  4. PATTERN "Does this come and go, or has it been constant since it started?"  "Is it present now?"     Present now, started yesterday  5. CARDIAC SYMPTOMS: "Have you had any of the following symptoms: chest pain, difficulty breathing, palpitations?"     No  6. NEUROLOGIC SYMPTOMS: "Have you had any of the following symptoms: headache, dizziness, vision loss, double vision, changes in speech, unsteady on your feet?"      Blurred Vision (Peripheral),   7. OTHER SYMPTOMS: "Do you have any other symptoms?"     No  8. PREGNANCY: "Is there any chance you are pregnant?" "When was your last menstrual period?"     No and No  Protocols used: Neurologic Deficit-A-AH

## 2024-02-16 NOTE — Patient Instructions (Signed)
  Medication Instructions:  Your physician recommends that you continue on your current medications as directed. Please refer to the Current Medication list given to you today. --If you need a refill on any your medications before your next appointment, please call your pharmacy first. If no refills are authorized on file call the office.--   Referrals/Procedures/Imaging: Neurology   Follow-Up: Your next appointment:   Your physician recommends that you schedule a follow-up appointment in: 4-6 month follow up  with Dr. de Peru  You will receive a text message or e-mail with a link to a survey about your care and experience with us  today! We would greatly appreciate your feedback!   Thanks for letting us  be apart of your health journey!!  Primary Care and Sports Medicine   Dr. Court Distance Peru   We encourage you to activate your patient portal called "MyChart".  Sign up information is provided on this After Visit Summary.  MyChart is used to connect with patients for Virtual Visits (Telemedicine).  Patients are able to view lab/test results, encounter notes, upcoming appointments, etc.  Non-urgent messages can be sent to your provider as well. To learn more about what you can do with MyChart, please visit --  [email or web address].

## 2024-02-18 ENCOUNTER — Telehealth (HOSPITAL_BASED_OUTPATIENT_CLINIC_OR_DEPARTMENT_OTHER): Payer: Self-pay | Admitting: *Deleted

## 2024-02-18 ENCOUNTER — Other Ambulatory Visit (HOSPITAL_BASED_OUTPATIENT_CLINIC_OR_DEPARTMENT_OTHER): Payer: Self-pay | Admitting: *Deleted

## 2024-02-18 DIAGNOSIS — R202 Paresthesia of skin: Secondary | ICD-10-CM

## 2024-02-18 NOTE — Telephone Encounter (Signed)
 Copied from CRM [id number]. Topic: Referral - Question >> Feb 18, 2024 10:52 AM Jyl Or S wrote: Reason for CRM: Patient is asking for  referral to be sent to a different facility she said [facility] dont have anything until July and she wants a earlier appt

## 2024-02-18 NOTE — Telephone Encounter (Signed)
 New referral placed for [facility] per patient request

## 2024-04-12 ENCOUNTER — Encounter: Payer: Self-pay | Admitting: Radiology

## 2024-04-12 ENCOUNTER — Ambulatory Visit (INDEPENDENT_AMBULATORY_CARE_PROVIDER_SITE_OTHER): Admitting: Radiology

## 2024-04-12 VITALS — BP 138/74 | HR 93 | Ht 65.35 in | Wt 355.8 lb

## 2024-04-12 DIAGNOSIS — Z1331 Encounter for screening for depression: Secondary | ICD-10-CM

## 2024-04-12 DIAGNOSIS — R102 Pelvic and perineal pain: Secondary | ICD-10-CM | POA: Diagnosis not present

## 2024-04-12 DIAGNOSIS — Z113 Encounter for screening for infections with a predominantly sexual mode of transmission: Secondary | ICD-10-CM

## 2024-04-12 DIAGNOSIS — Z01419 Encounter for gynecological examination (general) (routine) without abnormal findings: Secondary | ICD-10-CM

## 2024-04-12 NOTE — Progress Notes (Signed)
 Betty Shelton 10/08/1986 [id number]   History:  38 y.o. G3P2 presents for annual exam. Hx of microdiscectomy, still having back pain, worse with her period. Numbness on the left side of her body, has neuro appt next week. Would like full STI screen, hx of trich.  Gynecologic History Patient's last menstrual period was 03/27/2024. Period Cycle (Days): 26 Period Duration (Days): 6-7 Period Pattern: Regular Menstrual Flow: Moderate Menstrual Control: Other (Comment) (adult diapers) Dysmenorrhea: (!) Mild Dysmenorrhea Symptoms: Cramping Contraception/Family planning: rhythm method Sexually active: yes female and female partners Last Pap: 2023. Results were: normal   Obstetric History OB History  Gravida Para Term Preterm AB Living  3 2 2  0 1 2  SAB IAB Ectopic Multiple Live Births  1 0 0 0 2    # Outcome Date GA Lbr Len/2nd Weight Sex Type Anes PTL Lv  3 Term 08/16/15 [redacted]w[redacted]d  7 lb 10.9 oz (3.485 kg) M CS-LTranv EPI  LIV  2 Term 07/28/09 [redacted]w[redacted]d  5 lb 10 oz (2.551 kg) F CS-LTranv  N LIV  1 SAB                04/12/2024    8:05 AM 02/16/2024    9:55 AM 12/10/2023    2:58 PM  Depression screen PHQ 2/9  Decreased Interest 1 0 0  Down, Depressed, Hopeless 0 0 0  PHQ - 2 Score 1 0 0  Altered sleeping  0 0  Tired, decreased energy  0 0  Change in appetite  0 0  Feeling bad or failure about yourself   0 0  Trouble concentrating  0 0  Moving slowly or fidgety/restless  0 0  Suicidal thoughts  0 0  PHQ-9 Score  0 0  Difficult doing work/chores  Not difficult at all Not difficult at all     The following portions of the patient's history were reviewed and updated as appropriate: allergies, current medications, past family history, past medical history, past social history, past surgical history, and problem list.  Review of Systems  All other systems reviewed and are negative.   Past medical history, past surgical history, family history and social history were all reviewed and  documented in the EPIC chart.  Exam:  Vitals:   04/12/24 0807  BP: 138/74  Pulse: 93  SpO2: 92%  Weight: (!) 355 lb 12.8 oz (161.4 kg)  Height: 5' 5.35 (1.66 m)   Body mass index is 58.57 kg/m.  Physical Exam Vitals and nursing note reviewed. Exam conducted with a chaperone present.  Constitutional:      Appearance: Normal appearance. She is obese.  HENT:     Head: Normocephalic and atraumatic.  Neck:     Thyroid: No thyroid mass, thyromegaly or thyroid tenderness.   Cardiovascular:     Rate and Rhythm: Regular rhythm.     Heart sounds: Normal heart sounds.  Pulmonary:     Effort: Pulmonary effort is normal.     Breath sounds: Normal breath sounds.  Chest:  Breasts:    Breasts are symmetrical.     Right: Normal. No inverted nipple, mass, nipple discharge, skin change or tenderness.     Left: Normal. No inverted nipple, mass, nipple discharge, skin change or tenderness.  Abdominal:     General: Abdomen is flat. Bowel sounds are normal.     Palpations: Abdomen is soft.  Genitourinary:    General: Normal vulva.     Vagina: Normal. No vaginal discharge, bleeding or  lesions.     Cervix: Normal. No discharge or lesion.     Uterus: Normal. Not enlarged and not tender.      Adnexa: Right adnexa normal and left adnexa normal.       Right: No mass, tenderness or fullness.         Left: No mass, tenderness or fullness.    Lymphadenopathy:     Upper Body:     Right upper body: No axillary adenopathy.     Left upper body: No axillary adenopathy.   Skin:    General: Skin is warm and dry.   Neurological:     Mental Status: She is alert and oriented to person, place, and time.   Psychiatric:        Mood and Affect: Mood normal.        Thought Content: Thought content normal.        Judgment: Judgment normal.      [name], CMA present for exam  Assessment/Plan:   1. Well woman exam with routine gynecological exam (Primary) Pap 2026  2. Screening for STDs  (sexually transmitted diseases) - SURESWAB CT/NG/T. vaginalis - HIV Antibody (routine testing w rflx) - Hepatitis C antibody - RPR  3. Depression screening negative  4. Pelvic pain - US  PELVIS TRANSVAGINAL NON-OB (TV ONLY); Future    Return for u/s only.  GINETTE COZIER B WHNP-BC 8:29 AM 04/12/2024

## 2024-04-13 LAB — SURESWAB CT/NG/T. VAGINALIS
C. trachomatis RNA, TMA: NOT DETECTED
N. gonorrhoeae RNA, TMA: NOT DETECTED
Trichomonas vaginalis RNA: NOT DETECTED

## 2024-04-13 LAB — HIV ANTIBODY (ROUTINE TESTING W REFLEX): HIV 1&2 Ab, 4th Generation: NONREACTIVE

## 2024-04-13 LAB — HEPATITIS C ANTIBODY: Hepatitis C Ab: NONREACTIVE

## 2024-04-13 LAB — RPR: RPR Ser Ql: NONREACTIVE

## 2024-04-14 ENCOUNTER — Ambulatory Visit (INDEPENDENT_AMBULATORY_CARE_PROVIDER_SITE_OTHER)

## 2024-04-14 ENCOUNTER — Ambulatory Visit: Payer: Self-pay | Admitting: Radiology

## 2024-04-14 DIAGNOSIS — R102 Pelvic and perineal pain: Secondary | ICD-10-CM | POA: Diagnosis not present

## 2024-04-14 DIAGNOSIS — R9389 Abnormal findings on diagnostic imaging of other specified body structures: Secondary | ICD-10-CM

## 2024-04-14 MED ORDER — NORETHINDRONE 0.35 MG PO TABS: 1.0000 | ORAL_TABLET | Freq: Every day | ORAL | 0 refills | Status: DC

## 2024-04-17 NOTE — Progress Notes (Unsigned)
 [facility]  PATIENT: Betty Shelton DOB: 02/20/1986  REFERRING DOCTOR OR PCP: Raymond de Peru, MD SOURCE: Patient, notes from emergency room, imaging and lab reports, MRI images from several studies personally reviewed.  _________________________________   HISTORICAL  CHIEF COMPLAINT:  Chief Complaint  Patient presents with   New Patient (Initial Visit)    Pt in room 11.alone. Internal referral for numbness and tingling. Pt report tingling/numbness on left side from face down, started about 2 months. Pt said when walking the tingling is worse. Pt has noticed numbness on right arm, but when moving it subsides.  No vision concern, no recent eye exam.    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  She had surgery December 07, 2023 and improved but not to baseline --- continued to have difficulty with longer walks  and some numbness and tingling.   She had the onset of left sided fcial numbness 02/15/2024 that started while she was driving a car.   She had some tingling but no pain.    The numbness was also in the left arm and thigh down to toes.    She denied weakness.  Gait was off but she felt more due to her previous lumbar issue.  She went to the ED and brain MRI was normal.      Currently she has left sided numbness in face, arm and leg but not torso.  There is tingling like the limbs fell asleep. Her balance is mildly off and she uses the bannister and goes slow on stairs, especially down.    She notes some urinary urgency.   Compared to 02/15/2024, she is about the same.    Vision is about the same; she wears glasses   She sometimes notes a mental fog.  She has depression and insomnia.  In  the past, she was tried on gabapentin but had trouble tolerating it.    Meloxicam  and methocarbamol  has not helped current symptoms.     Imaging personally reviewed MRI of the brain 02/15/2024 is normal.  MRI of the lumbar spine 01/06/2024 showed prior laminectomy at L4-L5.  No recurrent disc herniation.  There is mild spinal stenosis at L3-L4 and moderate foraminal narrowing at L4-L5.  No nerve root compression.  REVIEW OF SYSTEMS: Constitutional: No fevers, chills, sweats, or change in appetite Eyes: No visual changes, double vision, eye pain Ear, nose and throat: No hearing loss, ear pain, nasal congestion, sore throat Cardiovascular: No chest pain, palpitations Respiratory:  No shortness of breath at rest or with exertion.   No wheezes GastrointestinaI: No nausea, vomiting, diarrhea, abdominal pain, fecal incontinence Genitourinary:  No dysuria, urinary retention or frequency.  No nocturia. Musculoskeletal:  No neck pain, back pain Integumentary: No rash, pruritus, skin lesions Neurological: as above Psychiatric: No depression at this time.  No anxiety Endocrine: No palpitations, diaphoresis, change in appetite, change in weigh or increased thirst Hematologic/Lymphatic:  No anemia, purpura, petechiae. Allergic/Immunologic: No itchy/runny eyes, nasal congestion, recent allergic reactions, rashes  ALLERGIES: Allergies  Allergen Reactions   Mucinex D [Pseudoephedrine-Guaifenesin] Hives   Guaifenesin Hives   Mucinex [Guaifenesin Er] Hives and Swelling    HOME  MEDICATIONS:  Current Outpatient Medications:    meloxicam  (MOBIC ) 7.5 MG tablet, Take 15 mg by mouth daily., Disp: , Rfl:    methocarbamol  (ROBAXIN ) 500 MG tablet, Take 500 mg by mouth every 6 (six) hours as needed., Disp: , Rfl:    traMADol  (ULTRAM ) 50 MG tablet, Take 1 tablet (50 mg total) by mouth every 6 (six) hours as needed., Disp: 30 tablet, Rfl: 2   norethindrone  (MICRONOR ) 0.35 MG tablet, Take 1 tablet (0.35 mg  total) by mouth daily. (Patient not taking: Reported on 04/19/2024), Disp: 90 tablet, Rfl: 0  PAST MEDICAL HISTORY: Past Medical History:  Diagnosis Date   Allergy    Back pain    back pain began 05/2022   Medical history non-contributory    Morbid obesity with BMI of 50.0-59.9, adult (HCC)     PAST SURGICAL HISTORY: Past Surgical History:  Procedure Laterality Date   CESAREAN SECTION     CESAREAN SECTION N/A 08/16/2015   Procedure: CESAREAN SECTION;  Surgeon: Norleen Edsel GAILS, MD;  Location: [facility];  Service: Obstetrics;  Laterality: N/A;   IUD REMOVAL N/A 11/28/2013   Procedure: INTRAUTERINE DEVICE (IUD) REMOVAL;  Surgeon: Harland JAYSON Birkenhead, MD;  Location: [facility];  Service: Gynecology;  Laterality: N/A;   MICRODISCECTOMY LUMBAR  2024   L4-L5   TOOTH EXTRACTION  10/13/2012   WISDOM TOOTH EXTRACTION      FAMILY HISTORY: Family History  Problem Relation Age of Onset   Diabetes Mother    Arthritis Mother        back pain, spinal fusions   Cancer Maternal Aunt        breast   Diabetes Maternal Grandmother    Hypertension Maternal Grandmother     SOCIAL HISTORY: Social History   Socioeconomic History   Marital status: Single    Spouse name: Not on file   Number of children: Not on file   Years of education: Not on file   Highest education level: Associate degree: occupational, Scientist, product/process development, or vocational program  Occupational History   Not on file  Tobacco Use   Smoking status: Every Day    Current packs/day: 0.25    Average packs/day: 0.3 packs/day for 8.0 years (2.0 ttl pk-yrs)    Types: Cigarettes    Passive exposure: Current   Smokeless tobacco: Never  Vaping Use   Vaping status: Never Used  Substance and Sexual Activity   Alcohol use: Yes    Comment: socially   Drug use: No   Sexual activity: Yes    Partners: Male, Female    Birth control/protection: Rhythm, Other-see comments    Comment: Hx of TV+ withdraw menarche 38yrs old sexual debut 38 yrs old  Other  Topics Concern   Not on file  Social History Narrative   Not on file   Social Drivers of Health   Financial Resource Strain: Medium Risk (12/09/2023)   Overall Financial Resource Strain (CARDIA)    Difficulty of Paying Living Expenses: Somewhat hard  Food Insecurity: No Food Insecurity (12/09/2023)   Hunger Vital Sign    Worried About Running Out of Food in the Last Year: Never true    Ran Out of Food in the Last Year: Never true  Transportation Needs: No Transportation Needs (12/09/2023)   PRAPARE - Administrator, Civil Service (Medical): No    Lack of Transportation (Non-Medical): No  Physical Activity: Unknown (12/09/2023)   Exercise Vital Sign    Days of Exercise per Week:  0 days    Minutes of Exercise per Session: Not on file  Stress: Stress Concern Present (12/09/2023)   [employer] of Occupational Health - Occupational Stress Questionnaire    Feeling of Stress : To some extent  Social Connections: Moderately Isolated (12/09/2023)   Social Connection and Isolation Panel    Frequency of Communication with Friends and Family: More than three times a week    Frequency of Social Gatherings with Friends and Family: Once a week    Attends Religious Services: Never    Database administrator or Organizations: No    Attends Engineer, structural: Not on file    Marital Status: Living with partner  Intimate Partner Violence: Unknown (01/16/2022)   Received from [facility]   HITS    Physically Hurt: Not on file    Insult or Talk Down To: Not on file    Threaten Physical Harm: Not on file    Scream or Curse: Not on file       PHYSICAL EXAM  Vitals:   04/19/24 0852  BP: (!) 143/89  Pulse: 89  Weight: (!) 356 lb (161.5 kg)  Height: 5' 5 (1.651 m)    Body mass index is 59.24 kg/m.   General: The patient is well-developed and well-nourished and in no acute distress  HEENT:  Head is [place]/AT.  Sclera are anicteric.  Funduscopic exam shows normal  optic discs and retinal vessels.  Neck: No carotid bruits are noted.  The neck is nontender.  Cardiovascular: The heart has a regular rate and rhythm with a normal S1 and S2. There were no murmurs, gallops or rubs.    Skin: Extremities are without rash or  edema.  Musculoskeletal:  Back is nontender  Neurologic Exam  Mental status: The patient is alert and oriented x 3 at the time of the examination. The patient has apparent normal recent and remote memory, with an apparently normal attention span and concentration ability.   Speech is normal.  Cranial nerves: Extraocular movements are full. Pupils are equal, round, and reactive to light and accomodation.    There is good facial sensation to soft touch bilaterally.Facial strength is normal.  Trapezius and sternocleidomastoid strength is normal. No dysarthria is noted.  The tongue is midline, and the patient has symmetric elevation of the soft palate. No obvious hearing deficits are noted.  Motor:  Muscle bulk is normal.   Tone is normal. Strength is  5 / 5 in all 4 extremities.   Sensory: No Tinels signs at wrist or elbow.   Sensory testing is intact to pinprick, soft touch and vibration sensation in all 4 extremities. (However, after sensory exam, she felt paresthesias on the left arm and to a lesser extent the leg  Coordination: Cerebellar testing reveals good finger-nose-finger and heel-to-shin bilaterally.  Gait and station: Station is normal.   Gait is normal. Tandem gait is mildly wide. Romberg is negative.   Reflexes: Deep tendon reflexes are symmetric and normal bilaterally.   Plantar responses are flexor.    DIAGNOSTIC DATA (LABS, IMAGING, TESTING) - I reviewed patient records, labs, notes, testing and imaging myself where available.  Lab Results  Component Value Date   WBC 8.7 02/15/2024   HGB 10.9 (L) 02/15/2024   HCT 34.9 (L) 02/15/2024   MCV 82.1 02/15/2024   PLT 288 02/15/2024      Component Value Date/Time   NA  135 02/15/2024 1115   K 4.3 02/15/2024 1115  CL 101 02/15/2024 1115   CO2 24 02/15/2024 1115   GLUCOSE 138 (H) 02/15/2024 1115   BUN 11 02/15/2024 1115   CREATININE 0.74 02/15/2024 1115   CREATININE 0.68 10/27/2013 0948   CALCIUM 9.2 02/15/2024 1115   PROT 7.0 02/15/2024 1115   ALBUMIN 4.2 02/15/2024 1115   AST 22 02/15/2024 1115   ALT 11 02/15/2024 1115   ALKPHOS 70 02/15/2024 1115   BILITOT 0.3 02/15/2024 1115   GFRNONAA >60 02/15/2024 1115   GFRAA >60 08/13/2015 2340   Lab Results  Component Value Date   CHOL 202 (H) 10/27/2013   HDL 38 (L) 10/27/2013   LDLCALC 147 (H) 10/27/2013   TRIG 83 10/27/2013   CHOLHDL 5.3 10/27/2013   No results found for: HGBA1C No results found for: VITAMINB12 Lab Results  Component Value Date   TSH 1.421 10/27/2013       ASSESSMENT AND PLAN  Left sided numbness  Other depression  Numbness and tingling  History of lumbar discectomy   In summary, Ms. Luthi is a 38 year old woman who has a history of lumbar herniated disc with left laminectomy who is now reporting numbness in the left face, arm and leg.  MRI of the brain performed at the onset of symptoms was normal.  Given her age, we still need to be concerned about demyelination and need to check an MRI of the cervical spine to rule out an inflammatory or compressive myelopathy that could be explaining her symptoms.  Distribution is too wide to be a radiculopathy or focal neuropathy.  Additionally, to help with the symptoms I will add duloxetine .  This will hopefully help the paresthesias and perhaps help her depression.  If she does not tolerate this, consider lamotrigine.  We will let her know the results of the MRI after it is performed.  A follow-up was not scheduled but she is advised to call for significant new or worsening neurologic symptoms or if she has difficulty tolerating duloxetine .  Thank you for asked me to see this patient.  Please let me know if I can be of  further assistance with her other patients in the future.    Emslee Lopezmartinez A. Vear, MD, [facility] 04/19/2024, 9:34 AM Certified in Neurology, Clinical Neurophysiology, Sleep Medicine and Neuroimaging  [facility] [address] [place], [place] [address] [phone]

## 2024-04-19 ENCOUNTER — Encounter: Payer: Self-pay | Admitting: Neurology

## 2024-04-19 ENCOUNTER — Telehealth: Payer: Self-pay | Admitting: Neurology

## 2024-04-19 ENCOUNTER — Ambulatory Visit (INDEPENDENT_AMBULATORY_CARE_PROVIDER_SITE_OTHER): Admitting: Neurology

## 2024-04-19 VITALS — BP 143/89 | HR 89 | Ht 65.0 in | Wt 356.0 lb

## 2024-04-19 DIAGNOSIS — F3289 Other specified depressive episodes: Secondary | ICD-10-CM | POA: Diagnosis not present

## 2024-04-19 DIAGNOSIS — Z9889 Other specified postprocedural states: Secondary | ICD-10-CM

## 2024-04-19 DIAGNOSIS — R200 Anesthesia of skin: Secondary | ICD-10-CM | POA: Diagnosis not present

## 2024-04-19 DIAGNOSIS — R202 Paresthesia of skin: Secondary | ICD-10-CM | POA: Diagnosis not present

## 2024-04-19 MED ORDER — DULOXETINE HCL 60 MG PO CPEP
60.0000 mg | ORAL_CAPSULE | Freq: Every day | ORAL | 3 refills | Status: DC
Start: 2024-04-19 — End: 2024-04-28

## 2024-04-19 NOTE — Telephone Encounter (Signed)
 U/s is 06-28-24.

## 2024-04-19 NOTE — Telephone Encounter (Signed)
 [employer] shara: [id number] exp. 04/19/24-06/03/24 sent to GI due to her weight. [phone]

## 2024-04-20 ENCOUNTER — Encounter: Payer: Self-pay | Admitting: Neurology

## 2024-04-21 ENCOUNTER — Encounter: Payer: Self-pay | Admitting: Neurology

## 2024-04-22 ENCOUNTER — Encounter: Payer: Self-pay | Admitting: Neurology

## 2024-04-22 ENCOUNTER — Ambulatory Visit: Admitting: Diagnostic Neuroimaging

## 2024-04-28 ENCOUNTER — Other Ambulatory Visit: Payer: Self-pay | Admitting: *Deleted

## 2024-04-28 ENCOUNTER — Encounter: Payer: Self-pay | Admitting: Neurology

## 2024-04-28 ENCOUNTER — Other Ambulatory Visit

## 2024-04-28 MED ORDER — NORTRIPTYLINE HCL 10 MG PO CAPS: 10.0000 mg | ORAL_CAPSULE | Freq: Every day | ORAL | 5 refills | Status: AC

## 2024-04-30 ENCOUNTER — Ambulatory Visit
Admission: RE | Admit: 2024-04-30 | Discharge: 2024-04-30 | Disposition: A | Discharge status: Home / Self Care | Source: Ambulatory Visit | Attending: Neurology | Admitting: Neurology

## 2024-04-30 ENCOUNTER — Ambulatory Visit: Payer: Self-pay | Admitting: Neurology

## 2024-04-30 DIAGNOSIS — R200 Anesthesia of skin: Secondary | ICD-10-CM

## 2024-05-02 NOTE — Telephone Encounter (Signed)
 Her other options would be Slynd or an IUD. Her lining is very thick and we need something to thin it before her next u/s. A thickened lining can increase her chances of endometrial cancer.

## 2024-05-03 NOTE — Telephone Encounter (Signed)
 If she does not want to try a hormonal option to thin the lining I recommend EMB. If we do not thin the lining it could turn into hyperplasia or cancer. She is at greater risk due to her obesity.

## 2024-06-28 ENCOUNTER — Other Ambulatory Visit: Payer: Self-pay | Admitting: Radiology

## 2024-06-28 ENCOUNTER — Ambulatory Visit: Payer: Self-pay | Admitting: Radiology

## 2024-06-28 ENCOUNTER — Ambulatory Visit (INDEPENDENT_AMBULATORY_CARE_PROVIDER_SITE_OTHER)

## 2024-06-28 DIAGNOSIS — R9389 Abnormal findings on diagnostic imaging of other specified body structures: Secondary | ICD-10-CM

## 2024-06-28 MED ORDER — MISOPROSTOL 200 MCG PO TABS: 400.0000 ug | ORAL_TABLET | Freq: Once | ORAL | 0 refills | Status: DC

## 2024-06-28 NOTE — Telephone Encounter (Signed)
 Endo bx appt is 07-21-24

## 2024-07-19 ENCOUNTER — Encounter (HOSPITAL_BASED_OUTPATIENT_CLINIC_OR_DEPARTMENT_OTHER): Admitting: Family Medicine

## 2024-07-21 ENCOUNTER — Other Ambulatory Visit: Payer: Self-pay | Admitting: *Deleted

## 2024-07-21 ENCOUNTER — Ambulatory Visit: Admitting: Radiology

## 2024-07-21 ENCOUNTER — Other Ambulatory Visit (HOSPITAL_COMMUNITY)
Admission: RE | Admit: 2024-07-21 | Discharge: 2024-07-21 | Disposition: A | Source: Ambulatory Visit | Attending: Radiology | Admitting: Radiology

## 2024-07-21 ENCOUNTER — Encounter: Payer: Self-pay | Admitting: Radiology

## 2024-07-21 ENCOUNTER — Other Ambulatory Visit: Payer: Self-pay | Admitting: Radiology

## 2024-07-21 VITALS — BP 142/90 | HR 95 | Ht 65.0 in | Wt 346.0 lb

## 2024-07-21 DIAGNOSIS — R9389 Abnormal findings on diagnostic imaging of other specified body structures: Secondary | ICD-10-CM

## 2024-07-21 DIAGNOSIS — K6289 Other specified diseases of anus and rectum: Secondary | ICD-10-CM

## 2024-07-21 DIAGNOSIS — K921 Melena: Secondary | ICD-10-CM | POA: Diagnosis not present

## 2024-07-21 LAB — PREGNANCY, URINE: Preg Test, Ur: NEGATIVE

## 2024-07-21 NOTE — Progress Notes (Signed)
      ENDOMETRIAL BIOPSY       Betty Shelton 38 y.o. presents for endometrial biopsy. Reason for biopsy: thickened endometrium. POPs, made her sleepy, Mirena was embedded. Now c/o blood in stool and rectal pain. She is concerned she has endometriosis.  The indications for endometrial biopsy were reviewed.    Risks of the biopsy including cramping, bleeding, infection, uterine perforation, inadequate specimen and need for additional procedures  were discussed.  The patient states she understands and agrees to undergo procedure today. Consent obtained.  Time out was performed.   Procedure Speculum inserted into the vagina, cervix visualized and was prepped with Betadine. A single-toothed tenaculum was placed on the anterior lip of the cervix to stabilize it.  The 3 mm pipelle was introduced into the endometrial cavity without difficulty to a depth of 8cm, suction initiated and a moderate amount of tissue was obtained and sent to pathology.  The instruments were removed from the patient's vagina.  Minimal bleeding from the cervix was noted.  The patient tolerated the procedure well.   [name], CMA present for exam  Assessment/Plan: 1. Thickened endometrium (Primary) - Surgical pathology( [facility]/ [facility])  2. Blood in stool - Ambulatory referral to Gastroenterology  3. Rectal pain - Ambulatory referral to Gastroenterology    Routine post-procedure instructions were given to the patient.   Will contact with results of biopsy.    Jamonte Curfman, WHNP

## 2024-07-25 LAB — SURGICAL PATHOLOGY

## 2024-07-26 ENCOUNTER — Ambulatory Visit: Payer: Self-pay | Admitting: Radiology

## 2024-08-18 ENCOUNTER — Ambulatory Visit (INDEPENDENT_AMBULATORY_CARE_PROVIDER_SITE_OTHER): Admitting: Obstetrics and Gynecology

## 2024-08-18 ENCOUNTER — Encounter: Payer: Self-pay | Admitting: Obstetrics and Gynecology

## 2024-08-18 VITALS — BP 108/64 | HR 84 | Wt 352.0 lb

## 2024-08-18 DIAGNOSIS — N859 Noninflammatory disorder of uterus, unspecified: Secondary | ICD-10-CM | POA: Diagnosis not present

## 2024-08-18 DIAGNOSIS — N946 Dysmenorrhea, unspecified: Secondary | ICD-10-CM

## 2024-08-18 DIAGNOSIS — K921 Melena: Secondary | ICD-10-CM | POA: Diagnosis not present

## 2024-08-18 NOTE — Progress Notes (Signed)
 Patient presents for consult from Betty Shelton 38 y.o. patient with thickened endometrial lining. Patient with multiple work-up for lower back pain that is present daily but worse with her cycle.  She is not trying to have more children and has 2 of her own.  She reports blood in her stool and has not seen GI. She is still having bothersome tingling sensation from head to feet on the left side.  Per Jami Note:. POPs, made her sleepy, Mirena was embedded. Now c/o blood in stool and rectal pain. She is concerned she has endometriosis.   2 Result Notes     View Follow-Up Encounter    Component Ref Range & Units (hover) 1 mo ago  SURGICAL PATHOLOGY SURGICAL PATHOLOGY CASE: [id number] PATIENT: Betty Shelton Surgical Pathology Report     Clinical History: thickened endometrium, menorrhagia (cm)     FINAL MICROSCOPIC DIAGNOSIS:  A. ENDOMETRIUM, BIOPSY:      Predominant endocervical glands with limited benign endometrial glands and stromal breakdown.      Negative for hyperplasia, atypia or malignancy.   GROSS DESCRIPTION:  Received in formalin is blood tinged mucus that is entirely submitted in one block.  Volume: 2.0 x 1.8 x 0.2 cm (1 B) (KW, 07/22/2024)  Final Diagnosis performed by Pepper Dutton, MD.   Electronically signed 07/25/2024 Technical component performed at [facility]. [facility], [address] [place], [place] [address].  Professional component performed at [facility]. [address] [place], [place] [address]  Immunohistochemistry Technical component (if applicable) was performed at [facility]. [address], [place], [place] [address].  IMMUNOHISTOCHEMISTRY DISCLAIMER (if applicable): Some of these immunohistochemical stains may have been developed and the performance characteristics determine by [facility] [place]. Some may not have been cleared or approved by the  U.S. Food and Drug Administration. The FDA has determined that such clearance or approval is not necessary. This test is used for clinical purposes. It should not be regarded as investigational or for research. This laboratory is certified under the Clinical Laboratory Improvement Amendments of 1988 (CLIA-88) as qualified to perform high complexity clinical laboratory testing.  The controls stained appropriately.       MyChart Results Release  MyChart Status: Active  Results Release   Surgical pathology( [place]/ [facility]) Order: [id number]  Status: Edited Result - FINAL     Next appt: None     Dx: Thickened endometrium   Test Result Released: Yes (seen)   2 Result Notes     View Follow-Up Encounter    Component Ref Range & Units (hover) 4 wk ago  SURGICAL PATHOLOGY SURGICAL PATHOLOGY CASE: [id number] PATIENT: Betty Shelton Surgical Pathology Report     Clinical History: thickened endometrium, menorrhagia (cm)     FINAL MICROSCOPIC DIAGNOSIS:  A. ENDOMETRIUM, BIOPSY:      Predominant endocervical glands with limited benign endometrial glands and stromal breakdown.      Negative for hyperplasia, atypia or malignancy.   GROSS DESCRIPTION:  Received in formalin is blood tinged mucus that is entirely submitted in one block.  Volume: 2.0 x 1.8 x 0.2 cm (1 B) (KW, 07/22/2024)      US  Transvaginal Non-OB (Accession [id number]) (Order [id number]) Imaging Date: 06/28/2024 Department: [facility] Released By: Baltazar Arland NOVAK Authorizing: Ginette Betty NOVAK, NP   Exam Status  Status  Final [99]   PACS Intelerad Image Link   Show images  for US  Transvaginal Non-OB Study Result  Narrative & Impression  Indication: thickened endometrium   Vaginal ultrasound   Anteverted uterus, normal size and shape No myometrial masses     Thick, avascular endometrium  16mm No masses or thickening seen   Right ovary normal Left  ovary not seen   No adnexal masses   No free fluid   Impression: Thickened endometrium       Result History  US  Transvaginal Non-OB (Order #[id number]) on 06/28/2024 - Order Result History Report  Assessment/Plan: 1. Thickened endometrium (Primary) - Surgical pathology( [place]/ [facility]) -biopsy was normal, but to run more definitive biopsy with the [facility] D&C, ECC and patient agreed to the [facility] IUD insert at the same time for endometrial protection. The procedure was reviewed in detail and what to expect.  2. Blood in stool - Ambulatory referral to Gastroenterology    Betty Shelton

## 2024-08-19 ENCOUNTER — Other Ambulatory Visit: Payer: Self-pay

## 2024-08-19 ENCOUNTER — Encounter: Payer: Self-pay | Admitting: Obstetrics and Gynecology

## 2024-08-19 DIAGNOSIS — K625 Hemorrhage of anus and rectum: Secondary | ICD-10-CM

## 2024-09-21 ENCOUNTER — Other Ambulatory Visit: Payer: Self-pay | Admitting: Gastroenterology

## 2024-10-04 ENCOUNTER — Other Ambulatory Visit: Payer: Self-pay

## 2024-10-04 ENCOUNTER — Encounter (HOSPITAL_COMMUNITY): Payer: Self-pay | Admitting: Gastroenterology

## 2024-10-04 NOTE — Progress Notes (Addendum)
 Anesthesia Review:  PCP: Cardiologist :  PPM/ ICD: Device Orders: Rep Notified:  Chest x-ray : 02/15/24-1view  EKG : 02/15/24  Echo : Stress test: Cardiac Cath :   Activity level:  Sleep Study/ CPAP : Fasting Blood Sugar :      / Checks Blood Sugar -- times a day:    Blood Thinner/ Instructions /Last Dose: ASA / Instructions/ Last Dose :    Pt has bowel prep instructons at home.

## 2024-10-17 NOTE — H&P (Signed)
 History of Present Illness     38/female referred for evaluation of blood in stool. She is trying figure out the cause of her back pain. When asked about blood in stools patient reports she has had lower back pain x2 years.  Had a herniated disk which caused pain down her legs which caused issues with walking.  Had lumbar surgery Feb 2024 to fix the problem however she is still with back pain.  Back pain is worse during her cycle.  Saw GYN who referred her here. T hick endometrial lining so considering IUD. No fibroids. Feels pressure in her rectal area like she needs to have a BM but stools will be Bristol #1 or #2. Never had a #4 stool. Needs to manually disimpact 3-4x a week.  During menstural cycle will have Bristol #5,6, 7; and can be multiple times a day.  Blood in stool: Yes.  Seen during menses but also seen with manual disimpaction.  rectal pain: no pain but pressure h/o hemorrhoids: none that she knows of Has tried colon cleanse and may have Bristol #5- Miralax, Duralax- no relief.  Abdominal pain: no regular pain unless does a colon cleanse or with diarrhea.  NSAIDs: noneopioid pain medications:  none Appetite: none Unexpected wt loss:  none  GI Procedures Colonoscopy: none EGD: none.  Vital Signs Wt: 351.0, Ht: 65, BMI: 58.4, Temp: 97.7, Pulse sitting: 94, BP sitting: 129/80.11/19- pt weighed in office.  Examination    General Examination: CONSTITUTIONAL: alert, NAD. L UNGS: regular breathing rate and effort.  HEART: RRR, no gallops, rub, or murmurs.  ABDOMEN: soft, ND, BS normal. Tender: all over abdomen with deep palpation. No rebound tenderness. No guarding or rigidity, no masses palpated. Unbale to discern hepatosplenomegaly.  RECTAL pt deferred rectal exam.   Assessments 1. Hematochezia - K92.1 (Primary)    2. Chronic constipation - K59.00    3. Rectal pressure - R19.8    4. Back pain - M54.9      Treatment 1. Hematochezia     LAB: CBC without Diff  (Collection Date & Time - 08/31/2024 02:47 PM)  IMAGING: Colonoscopy (Ordered for 08/31/2024)    Notes: She reports her blood in stool is seeing during her menstrual cycle or when she manually disimpacts.She deferred rectal exam today.It is possible that her blood in stool was not coming from her rectal area.Will get CBC.Will ]schedule colonoscopy - at hospital due to BMI- will do a 2 day prep.     2. Chronic constipation      Notes: Miralax BID x 2 days while starting Linzess. Samples Linzess 145mcg tab givenTake Linzess 1 tablet with 10 ounces of water 30 minutes prior to breakfast. Do not eat a meal that is high in fat or greasy for breakfast.- patient to call and inform if Linzess 145 mg tablet is helping with her constipation. Prescription will be sent in at that time. If it is not helping recommend Amitiza 24 mcg twice daily with food.    3. Rectal pressure     IMAGING: Colonoscopy  WBC 9.8  4.0-11.0 - K/ul      RBC 4.43  4.20-5.40 - M/uL      HGB 11.1 L 12.0-16.0 - g/dL      HCT 65.3 L 62.9-52.9 - %      MCV 77.9 L 81.0-99.0 - fL      MCH 25.0 L 27.0-33.0 - pg      MCHC 32.1  32.0-36.0 - g/dL  RDW 15.6 H 11.5-15.5 - %      PLT 415 H 150-400 - K/uL

## 2024-10-18 ENCOUNTER — Other Ambulatory Visit: Payer: Self-pay

## 2024-10-18 ENCOUNTER — Ambulatory Visit (HOSPITAL_COMMUNITY)
Admission: RE | Admit: 2024-10-18 | Discharge: 2024-10-18 | Disposition: A | Discharge status: Home / Self Care | Attending: Gastroenterology | Admitting: Gastroenterology

## 2024-10-18 ENCOUNTER — Encounter (HOSPITAL_COMMUNITY)
Admission: RE | Disposition: A | Discharge status: Home / Self Care | Payer: Self-pay | Source: Home / Self Care | Attending: Gastroenterology

## 2024-10-18 ENCOUNTER — Ambulatory Visit (HOSPITAL_COMMUNITY): Admitting: Certified Registered Nurse Anesthetist

## 2024-10-18 ENCOUNTER — Encounter (HOSPITAL_COMMUNITY): Payer: Self-pay | Admitting: Gastroenterology

## 2024-10-18 DIAGNOSIS — K529 Noninfective gastroenteritis and colitis, unspecified: Secondary | ICD-10-CM | POA: Insufficient documentation

## 2024-10-18 DIAGNOSIS — F419 Anxiety disorder, unspecified: Secondary | ICD-10-CM | POA: Diagnosis not present

## 2024-10-18 DIAGNOSIS — K5730 Diverticulosis of large intestine without perforation or abscess without bleeding: Secondary | ICD-10-CM | POA: Insufficient documentation

## 2024-10-18 DIAGNOSIS — D125 Benign neoplasm of sigmoid colon: Secondary | ICD-10-CM

## 2024-10-18 DIAGNOSIS — K625 Hemorrhage of anus and rectum: Secondary | ICD-10-CM | POA: Diagnosis not present

## 2024-10-18 DIAGNOSIS — K633 Ulcer of intestine: Secondary | ICD-10-CM | POA: Insufficient documentation

## 2024-10-18 DIAGNOSIS — K648 Other hemorrhoids: Secondary | ICD-10-CM | POA: Diagnosis not present

## 2024-10-18 DIAGNOSIS — D128 Benign neoplasm of rectum: Secondary | ICD-10-CM | POA: Diagnosis not present

## 2024-10-18 DIAGNOSIS — F32A Depression, unspecified: Secondary | ICD-10-CM | POA: Insufficient documentation

## 2024-10-18 DIAGNOSIS — D124 Benign neoplasm of descending colon: Secondary | ICD-10-CM | POA: Insufficient documentation

## 2024-10-18 DIAGNOSIS — K921 Melena: Secondary | ICD-10-CM | POA: Insufficient documentation

## 2024-10-18 DIAGNOSIS — F418 Other specified anxiety disorders: Secondary | ICD-10-CM

## 2024-10-18 DIAGNOSIS — K6389 Other specified diseases of intestine: Secondary | ICD-10-CM | POA: Insufficient documentation

## 2024-10-18 DIAGNOSIS — F17210 Nicotine dependence, cigarettes, uncomplicated: Secondary | ICD-10-CM | POA: Insufficient documentation

## 2024-10-18 DIAGNOSIS — K5900 Constipation, unspecified: Secondary | ICD-10-CM | POA: Diagnosis not present

## 2024-10-18 HISTORY — PX: BIOPSY OF SKIN SUBCUTANEOUS TISSUE AND/OR MUCOUS MEMBRANE: SHX6741

## 2024-10-18 HISTORY — PX: COLONOSCOPY: SHX5424

## 2024-10-18 HISTORY — PX: POLYPECTOMY: SHX149

## 2024-10-18 HISTORY — DX: Depression, unspecified: F32.A

## 2024-10-18 LAB — PREGNANCY, URINE: Preg Test, Ur: NEGATIVE

## 2024-10-18 SURGERY — COLONOSCOPY
Anesthesia: Monitor Anesthesia Care

## 2024-10-18 MED ORDER — PROPOFOL 500 MG/50ML IV EMUL
INTRAVENOUS | Status: DC | PRN
  Administered 2024-10-18 (×2): 20 mg via INTRAVENOUS
  Administered 2024-10-18: 100 ug/kg/min via INTRAVENOUS
  Administered 2024-10-18: 50 mg via INTRAVENOUS
  Administered 2024-10-18: 30 mg via INTRAVENOUS

## 2024-10-18 MED ORDER — LACTATED RINGERS IV SOLN: INTRAVENOUS | Status: DC | PRN

## 2024-10-18 MED ORDER — GLYCOPYRROLATE PF 0.2 MG/ML IJ SOSY
PREFILLED_SYRINGE | INTRAMUSCULAR | Status: DC | PRN
  Administered 2024-10-18: .2 mg via INTRAVENOUS

## 2024-10-18 MED ORDER — ONDANSETRON HCL 4 MG/2ML IJ SOLN
INTRAMUSCULAR | Status: DC | PRN
  Administered 2024-10-18: 4 mg via INTRAVENOUS

## 2024-10-18 MED ORDER — SODIUM CHLORIDE 0.9 % IV SOLN: INTRAVENOUS | Status: DC

## 2024-10-18 MED ORDER — METOCLOPRAMIDE HCL 5 MG/ML IJ SOLN
INTRAMUSCULAR | Status: DC | PRN
  Administered 2024-10-18: 10 mg via INTRAVENOUS

## 2024-10-18 MED ORDER — PROPOFOL 1000 MG/100ML IV EMUL
INTRAVENOUS | Status: AC
  Filled 2024-10-18: qty 100

## 2024-10-18 NOTE — Anesthesia Postprocedure Evaluation (Signed)
"   Anesthesia Post Note  Patient: Betty Shelton  Procedure(s) Performed: COLONOSCOPY POLYPECTOMY, INTESTINE BIOPSY, SKIN, SUBCUTANEOUS TISSUE, OR MUCOUS MEMBRANE     Patient location during evaluation: PACU Anesthesia Type: MAC Level of consciousness: awake and alert Pain management: pain level controlled Vital Signs Assessment: post-procedure vital signs reviewed and stable Respiratory status: spontaneous breathing, nonlabored ventilation, respiratory function stable and patient connected to nasal cannula oxygen Cardiovascular status: stable and blood pressure returned to baseline Postop Assessment: no apparent nausea or vomiting Anesthetic complications: no   No notable events documented.  Last Vitals:  Vitals:   10/18/24 0810 10/18/24 0820  BP:  (!) 149/76  Pulse: 90 87  Resp: (!) 22 17  Temp:    SpO2: 97% 98%    Last Pain:  Vitals:   10/18/24 0820  TempSrc:   PainSc: 0-No pain                 Thom JONELLE Peoples      "

## 2024-10-18 NOTE — Op Note (Signed)
 [facility] Patient Name: Betty Shelton Procedure Date: 10/18/2024 MRN: [id number] Attending MD: Estelita Manas , MD, [id number] Date of Birth: [birth year] CSN: [id number] Age: 39 Admit Type: Outpatient Procedure:                Colonoscopy Indications:              This is the patient's first colonoscopy, Rectal                            bleeding, Constipation, rectal pressure Providers:                Estelita Manas, MD, Randall Lines, RN, Corene Southgate,                            Technician Referring MD:             Raymond de Cuba, MD Medicines:                Monitored Anesthesia Care Complications:            No immediate complications. Estimated blood loss:                            Minimal. Estimated Blood Loss:     Estimated blood loss was minimal. Procedure:                Pre-Anesthesia Assessment:                           - Prior to the procedure, a History and Physical                            was performed, and patient medications and                            allergies were reviewed. The patient's tolerance of                            previous anesthesia was also reviewed. The risks                            and benefits of the procedure and the sedation                            options and risks were discussed with the patient.                            All questions were answered, and informed consent                            was obtained. Prior Anticoagulants: The patient has                            taken no anticoagulant or antiplatelet agents. ASA  Grade Assessment: III - A patient with severe                            systemic disease. After reviewing the risks and                            benefits, the patient was deemed in satisfactory                            condition to undergo the procedure.                           After obtaining informed consent, the colonoscope                            was passed under  direct vision. Throughout the                            procedure, the patient's blood pressure, pulse, and                            oxygen saturations were monitored continuously. The                            CF-HQ190L ([id number]) Olympus colonoscope was                            introduced through the anus and advanced to the the                            terminal ileum. The terminal ileum, ileocecal                            valve, appendiceal orifice, and rectum were                            photographed. Scope In: 7:39:30 AM Scope Out: 7:56:52 AM Scope Withdrawal Time: 0 hours 14 minutes 21 seconds  Total Procedure Duration: 0 hours 17 minutes 22 seconds  Findings:      Hemorrhoids were found on perianal exam.      The terminal ileum appeared normal.      A patchy area of mildly congested and ulcerated mucosa was found in the       cecum. Biopsies were taken with a cold forceps for histology.      A few medium-mouthed diverticula were found in the sigmoid colon.      Six sessile polyps were found in the rectum, sigmoid colon and       descending colon. The polyps were 3 to 5 mm in size. These polyps were       removed with a cold biopsy forceps. Resection and retrieval were       complete.      Non-bleeding internal hemorrhoids were found during retroflexion. Impression:               - Hemorrhoids found on perianal exam.                           -  The examined portion of the ileum was normal.                           - Congested and ulcerated mucosa in the cecum.                            Biopsied.                           - Diverticulosis in the sigmoid colon.                           - Six 3 to 5 mm polyps in the rectum, in the                            sigmoid colon and in the descending colon, removed                            with a cold biopsy forceps. Resected and retrieved.                           - Non-bleeding internal hemorrhoids. Moderate Sedation:       Patient did not receive moderate sedation for this procedure, but       instead received monitored anesthesia care. Recommendation:           - Patient has a contact number available for                            emergencies. The signs and symptoms of potential                            delayed complications were discussed with the                            patient. Return to normal activities tomorrow.                            Written discharge instructions were provided to the                            patient.                           - High fiber diet.                           - Await pathology results.                           - Repeat colonoscopy for surveillance of multiple                            polyps. Procedure Code(s):        --- Professional ---  54619, Colonoscopy, flexible; with biopsy, single                            or multiple Diagnosis Code(s):        --- Professional ---                           K64.8, Other hemorrhoids                           K63.89, Other specified diseases of intestine                           K63.3, Ulcer of intestine                           D12.8, Benign neoplasm of rectum                           D12.5, Benign neoplasm of sigmoid colon                           D12.4, Benign neoplasm of descending colon                           K62.5, Hemorrhage of anus and rectum                           K59.00, Constipation, unspecified                           K57.30, Diverticulosis of large intestine without                            perforation or abscess without bleeding CPT copyright 2022 American Medical Association. All rights reserved. The codes documented in this report are preliminary and upon coder review may  be revised to meet current compliance requirements. Estelita Manas, MD 10/18/2024 8:05:40 AM This report has been signed electronically. Number of Addenda: 0

## 2024-10-18 NOTE — Anesthesia Procedure Notes (Signed)
 Procedure Name: MAC Date/Time: 10/18/2024 7:35 AM  Performed by: Joshua Vernell BROCKS, CRNAPre-anesthesia Checklist: Patient identified, Emergency Drugs available, Suction available and Patient being monitored Patient Re-evaluated:Patient Re-evaluated prior to induction Oxygen Delivery Method: Simple face mask Placement Confirmation: positive ETCO2 and breath sounds checked- equal and bilateral Dental Injury: Teeth and Oropharynx as per pre-operative assessment

## 2024-10-18 NOTE — Discharge Instructions (Signed)
 YOU HAD AN ENDOSCOPIC PROCEDURE TODAY: Refer to the procedure report and other information in the discharge instructions given to you for any specific questions about what was found during the examination. If this information does not answer your questions, please call the [facility] office at [phone] to clarify.   YOU SHOULD EXPECT: Some feelings of bloating in the abdomen. Passage of more gas than usual. Walking can help get rid of the air that was put into your GI tract during the procedure and reduce the bloating. If you had a lower endoscopy (such as a colonoscopy or flexible sigmoidoscopy) you may notice spotting of blood in your stool or on the toilet paper. Some abdominal soreness may be present for a day or two, also.  DIET: Your first meal following the procedure should be a light meal and then it is ok to progress to your normal diet. A half-sandwich or bowl of soup is an example of a good first meal. Heavy or fried foods are harder to digest and may make you feel nauseous or bloated. Drink plenty of fluids but you should avoid alcoholic beverages for 24 hours.  ACTIVITY: Your care partner should take you home directly after the procedure. You should plan to take it easy, moving slowly for the rest of the day. You can resume normal activity the day after the procedure however YOU SHOULD NOT DRIVE, use power tools, machinery or perform tasks that involve climbing or major physical exertion for 24 hours (because of the sedation medicines used during the test).   SYMPTOMS TO REPORT IMMEDIATELY: A gastroenterologist can be reached at any hour. Please call [phone]  for any of the following symptoms:  Following lower endoscopy (colonoscopy, flexible sigmoidoscopy) Excessive amounts of blood in the stool  Significant tenderness, worsening of abdominal pains  Swelling of the abdomen that is new, acute  Fever of 100 or higher  Following upper endoscopy (EGD, EUS, ERCP, esophageal  dilation) Vomiting of blood or coffee ground material  New, significant abdominal pain  New, significant chest pain or pain under the shoulder blades  Painful or persistently difficult swallowing  New shortness of breath  Black, tarry-looking or red, bloody stools  FOLLOW UP:  If any biopsies were taken you will be contacted by phone or by letter within the next 1-3 weeks. Call [phone]  if you have not heard about the biopsies in 3 weeks.  Please also call with any specific questions about appointments or follow up tests.

## 2024-10-18 NOTE — Anesthesia Preprocedure Evaluation (Addendum)
"                                    Anesthesia Evaluation  Patient identified by MRN, date of birth, ID band Patient awake    Reviewed: Allergy & Precautions, H&P , NPO status , Patient's Chart, lab work & pertinent test results  Airway Mallampati: II  TM Distance: >3 FB Neck ROM: Full    Dental no notable dental hx.    Pulmonary Current Smoker   Pulmonary exam normal breath sounds clear to auscultation       Cardiovascular (-) angina negative cardio ROS Normal cardiovascular exam Rhythm:Regular Rate:Normal     Neuro/Psych  PSYCHIATRIC DISORDERS Anxiety Depression    negative neurological ROS     GI/Hepatic Neg liver ROS,neg GERD  ,, Hematochezia   Endo/Other    Class 4 obesity  Renal/GU negative Renal ROS  negative genitourinary   Musculoskeletal negative musculoskeletal ROS (+)    Abdominal  (+) + obese  Peds negative pediatric ROS (+)  Hematology negative hematology ROS (+)   Anesthesia Other Findings   Reproductive/Obstetrics negative OB ROS                              Anesthesia Physical Anesthesia Plan  ASA: 4  Anesthesia Plan: MAC   Post-op Pain Management:    Induction: Intravenous  PONV Risk Score and Plan: Propofol  infusion and Treatment may vary due to age or medical condition  Airway Management Planned: Natural Airway  Additional Equipment:   Intra-op Plan:   Post-operative Plan:   Informed Consent: I have reviewed the patients History and Physical, chart, labs and discussed the procedure including the risks, benefits and alternatives for the proposed anesthesia with the patient or authorized representative who has indicated his/her understanding and acceptance.     Dental advisory given  Plan Discussed with: CRNA  Anesthesia Plan Comments:          Anesthesia Quick Evaluation  "

## 2024-10-18 NOTE — Transfer of Care (Signed)
 Immediate Anesthesia Transfer of Care Note  Patient: SOLANGEL MCMANAWAY  Procedure(s) Performed: Procedures: COLONOSCOPY (N/A) POLYPECTOMY, INTESTINE BIOPSY, SKIN, SUBCUTANEOUS TISSUE, OR MUCOUS MEMBRANE  Patient Location: PACU  Anesthesia Type:MAC  Level of Consciousness: Patient easily awoken, comfortable, cooperative, following commands, responds to stimulation.   Airway & Oxygen Therapy: Patient spontaneously breathing, ventilating well, oxygen via simple oxygen mask.  Post-op Assessment: Report given to PACU RN, vital signs reviewed and stable, moving all extremities.   Post vital signs: Reviewed and stable.  Complications: No apparent anesthesia complications  Last Vitals:  Vitals Value Taken Time  BP 151/107 10/18/2024 0803  Temp    Pulse 104 10/18/2024 0803  Resp 12 10/18/2024 0803  SpO2 100 10/18/2024 0803    Last Pain:  Vitals:   10/18/24 0657  TempSrc: Temporal  PainSc: 0-No pain      Patients Stated Pain Goal: 0 (10/18/24 0657)  Complications: No notable events documented.

## 2024-10-18 NOTE — Interval H&P Note (Signed)
 History and Physical Interval Note: 38/female with hematochezia, rectal pressure and constipation for colonoscopy with propofol .  10/18/2024 7:23 AM  Betty Shelton  has presented today for colonoscopy with propofol , with the diagnosis of K92.1 Hematochezia K59.00 Chronic constipation R19.8 Rectal pressure.  The various methods of treatment have been discussed with the patient and family. After consideration of risks, benefits and other options for treatment, the patient has consented to  Procedures: COLONOSCOPY (N/A) as a surgical intervention.  The patient's history has been reviewed, patient examined, no change in status, stable for surgery.  I have reviewed the patient's chart and labs.  Questions were answered to the patient's satisfaction.     Betty Shelton

## 2024-10-19 LAB — SURGICAL PATHOLOGY
